# Patient Record
Sex: Female | Born: 1955 | Race: Black or African American | Hispanic: No | Marital: Single | Smoking: Former smoker
Health system: Southern US, Community
[De-identification: ages and names within clinical notes are randomized; demographics above are authoritative.]

## PROBLEM LIST (undated history)

## (undated) DIAGNOSIS — M797 Fibromyalgia: Secondary | ICD-10-CM

## (undated) DIAGNOSIS — I10 Essential (primary) hypertension: Secondary | ICD-10-CM

## (undated) DIAGNOSIS — M51369 Other intervertebral disc degeneration, lumbar region without mention of lumbar back pain or lower extremity pain: Secondary | ICD-10-CM

## (undated) DIAGNOSIS — K2270 Barrett's esophagus without dysplasia: Secondary | ICD-10-CM

## (undated) DIAGNOSIS — M5136 Other intervertebral disc degeneration, lumbar region: Secondary | ICD-10-CM

## (undated) DIAGNOSIS — G43909 Migraine, unspecified, not intractable, without status migrainosus: Secondary | ICD-10-CM

## (undated) DIAGNOSIS — K259 Gastric ulcer, unspecified as acute or chronic, without hemorrhage or perforation: Secondary | ICD-10-CM

## (undated) HISTORY — PX: COLONOSCOPY: SHX174

## (undated) HISTORY — PX: UPPER GI ENDOSCOPY: SHX6162

## (undated) HISTORY — PX: ABDOMINAL HYSTERECTOMY: SHX81

---

## 2010-03-12 ENCOUNTER — Emergency Department (HOSPITAL_BASED_OUTPATIENT_CLINIC_OR_DEPARTMENT_OTHER): Admission: EM | Admit: 2010-03-12 | Discharge: 2010-03-12 | Payer: Self-pay | Admitting: Emergency Medicine

## 2010-03-14 ENCOUNTER — Emergency Department (HOSPITAL_BASED_OUTPATIENT_CLINIC_OR_DEPARTMENT_OTHER): Admission: EM | Admit: 2010-03-14 | Discharge: 2010-03-14 | Payer: Self-pay | Admitting: Emergency Medicine

## 2010-03-14 ENCOUNTER — Ambulatory Visit: Payer: Self-pay | Admitting: Diagnostic Radiology

## 2010-07-22 ENCOUNTER — Emergency Department (HOSPITAL_BASED_OUTPATIENT_CLINIC_OR_DEPARTMENT_OTHER)
Admission: EM | Admit: 2010-07-22 | Discharge: 2010-07-22 | Disposition: A | Discharge status: Home / Self Care | Payer: Medicare Other | Attending: Emergency Medicine | Admitting: Emergency Medicine

## 2010-07-22 DIAGNOSIS — I10 Essential (primary) hypertension: Secondary | ICD-10-CM | POA: Insufficient documentation

## 2010-07-22 DIAGNOSIS — Z79899 Other long term (current) drug therapy: Secondary | ICD-10-CM | POA: Insufficient documentation

## 2010-07-22 DIAGNOSIS — IMO0001 Reserved for inherently not codable concepts without codable children: Secondary | ICD-10-CM | POA: Insufficient documentation

## 2010-07-22 DIAGNOSIS — K219 Gastro-esophageal reflux disease without esophagitis: Secondary | ICD-10-CM | POA: Insufficient documentation

## 2010-07-22 DIAGNOSIS — G43909 Migraine, unspecified, not intractable, without status migrainosus: Secondary | ICD-10-CM | POA: Insufficient documentation

## 2010-08-30 LAB — CBC
HCT: 39.2 % (ref 36.0–46.0)
Hemoglobin: 12.9 g/dL (ref 12.0–15.0)
MCH: 29.6 pg (ref 26.0–34.0)
MCHC: 32.9 g/dL (ref 30.0–36.0)
MCV: 90.2 fL (ref 78.0–100.0)
RBC: 4.35 MIL/uL (ref 3.87–5.11)

## 2010-08-30 LAB — DIFFERENTIAL
Basophils Relative: 1 % (ref 0–1)
Eosinophils Absolute: 0.1 10*3/uL (ref 0.0–0.7)
Lymphs Abs: 4.2 10*3/uL — ABNORMAL HIGH (ref 0.7–4.0)
Monocytes Absolute: 1.3 10*3/uL — ABNORMAL HIGH (ref 0.1–1.0)
Monocytes Relative: 10 % (ref 3–12)
Neutrophils Relative %: 56 % (ref 43–77)

## 2010-08-30 LAB — POCT TOXICOLOGY PANEL: Opiates: POSITIVE

## 2010-08-30 LAB — BASIC METABOLIC PANEL
CO2: 23 mEq/L (ref 19–32)
Chloride: 104 mEq/L (ref 96–112)
GFR calc Af Amer: >60 mL/min (ref >60)
Glucose, Bld: 96 mg/dL (ref 70–99)
Potassium: 3.4 mEq/L — ABNORMAL LOW (ref 3.5–5.1)
Sodium: 141 mEq/L (ref 135–145)

## 2014-01-18 ENCOUNTER — Emergency Department (HOSPITAL_COMMUNITY)
Admission: EM | Admit: 2014-01-18 | Discharge: 2014-01-18 | Disposition: A | Discharge status: Home / Self Care | Payer: Medicare Other | Attending: Emergency Medicine | Admitting: Emergency Medicine

## 2014-01-18 ENCOUNTER — Encounter (HOSPITAL_COMMUNITY): Payer: Self-pay | Admitting: Emergency Medicine

## 2014-01-18 DIAGNOSIS — Z8739 Personal history of other diseases of the musculoskeletal system and connective tissue: Secondary | ICD-10-CM | POA: Diagnosis not present

## 2014-01-18 DIAGNOSIS — Y939 Activity, unspecified: Secondary | ICD-10-CM | POA: Diagnosis not present

## 2014-01-18 DIAGNOSIS — G43909 Migraine, unspecified, not intractable, without status migrainosus: Secondary | ICD-10-CM | POA: Diagnosis not present

## 2014-01-18 DIAGNOSIS — Z880 Allergy status to penicillin: Secondary | ICD-10-CM | POA: Diagnosis not present

## 2014-01-18 DIAGNOSIS — IMO0001 Reserved for inherently not codable concepts without codable children: Secondary | ICD-10-CM | POA: Diagnosis not present

## 2014-01-18 DIAGNOSIS — R52 Pain, unspecified: Secondary | ICD-10-CM | POA: Diagnosis not present

## 2014-01-18 DIAGNOSIS — F17200 Nicotine dependence, unspecified, uncomplicated: Secondary | ICD-10-CM | POA: Insufficient documentation

## 2014-01-18 DIAGNOSIS — S90569A Insect bite (nonvenomous), unspecified ankle, initial encounter: Secondary | ICD-10-CM | POA: Insufficient documentation

## 2014-01-18 DIAGNOSIS — R110 Nausea: Secondary | ICD-10-CM | POA: Diagnosis not present

## 2014-01-18 DIAGNOSIS — Y9229 Other specified public building as the place of occurrence of the external cause: Secondary | ICD-10-CM | POA: Diagnosis not present

## 2014-01-18 DIAGNOSIS — W57XXXA Bitten or stung by nonvenomous insect and other nonvenomous arthropods, initial encounter: Secondary | ICD-10-CM | POA: Diagnosis not present

## 2014-01-18 DIAGNOSIS — R51 Headache: Secondary | ICD-10-CM

## 2014-01-18 DIAGNOSIS — R519 Headache, unspecified: Secondary | ICD-10-CM

## 2014-01-18 DIAGNOSIS — S30860A Insect bite (nonvenomous) of lower back and pelvis, initial encounter: Secondary | ICD-10-CM | POA: Diagnosis not present

## 2014-01-18 HISTORY — DX: Fibromyalgia: M79.7

## 2014-01-18 MED ORDER — DIPHENHYDRAMINE HCL 50 MG/ML IJ SOLN
25.0000 mg | Freq: Once | INTRAMUSCULAR | Status: AC
  Administered 2014-01-18: 25 mg via INTRAMUSCULAR
  Filled 2014-01-18: qty 1

## 2014-01-18 MED ORDER — HYDROXYZINE HCL 25 MG PO TABS: 25.0000 mg | ORAL_TABLET | Freq: Four times a day (QID) | ORAL | Status: DC

## 2014-01-18 MED ORDER — METOCLOPRAMIDE HCL 5 MG/ML IJ SOLN
10.0000 mg | Freq: Once | INTRAMUSCULAR | Status: AC
  Administered 2014-01-18: 10 mg via INTRAMUSCULAR
  Filled 2014-01-18: qty 2

## 2014-01-18 NOTE — ED Notes (Signed)
Pt presents to department for evaluation of insect bites all over body. States she was staying at hotel over the weekend that has bed bugs. Now states itching and discomfort all over body. Pt is alert and oriented x4. NAD.

## 2014-01-18 NOTE — Discharge Instructions (Signed)
Please read and follow all provided instructions.  Your diagnoses today include:  1. Bed bug bite   2. Acute nonintractable headache, unspecified headache type     Tests performed today include:  Vital signs. See below for your results today.   Medications:  In the Emergency Department you received:  Reglan - antinausea/headache medication  Benadryl - antihistamine to counteract potential side effects of reglan    Hydroxyzine - antihistamine  You can find this medication over-the-counter.   This medication will make you drowsy. DO NOT drive or perform any activities that require you to be awake and alert if taking this.  Take any prescribed medications only as directed.  Additional information:  Follow any educational materials contained in this packet.  You are having a headache. No specific cause was found today for your headache. It may have been a migraine or other cause of headache. Stress, anxiety, fatigue, and depression are common triggers for headaches.   Your headache today does not appear to be life-threatening or require hospitalization, but often the exact cause of headaches is not determined in the emergency department. Therefore, follow-up with your doctor is very important to find out what may have caused your headache and whether or not you need any further diagnostic testing or treatment.   Sometimes headaches can appear benign (not harmful), but then more serious symptoms can develop which should prompt an immediate re-evaluation by your doctor or the emergency department.  BE VERY CAREFUL not to take multiple medicines containing Tylenol (also called acetaminophen). Doing so can lead to an overdose which can damage your liver and cause liver failure and possibly death.   Follow-up instructions: Please follow-up with your primary care provider in the next 3 days for further evaluation of your symptoms.   Return instructions:   Please return to the  Emergency Department if you experience worsening symptoms.  Return if the medications do not resolve your headache, if it recurs, or if you have multiple episodes of vomiting or cannot keep down fluids.  Return if you have a change from the usual headache.  RETURN IMMEDIATELY IF you:  Develop a sudden, severe headache  Develop confusion or become poorly responsive or faint  Develop a fever above 100.8F or problem breathing  Have a change in speech, vision, swallowing, or understanding  Develop new weakness, numbness, tingling, incoordination in your arms or legs  Have a seizure  Please return if you have any other emergent concerns.  Additional Information:  Your vital signs today were: BP 135/94   Pulse 108   Temp(Src) 98.3 F (36.8 C) (Oral)   Resp 18   SpO2 97% If your blood pressure (BP) was elevated above 135/85 this visit, please have this repeated by your doctor within one month. --------------

## 2014-01-18 NOTE — ED Provider Notes (Signed)
CSN: [id number]     Arrival date & time 01/18/14  1149 History  This chart was scribed for Rhea BleacherJosh Gearlene Godsil, PA-C, working with Suzi RootsKevin E Steinl, MD by Leona CarryG. Clay Sherrill, ED Scribe. The patient was seen in TR05C/TR05C. The patient's care was started at 12:07 PM.   Chief Complaint  Patient presents with  . Insect Bite    The history is provided by the patient. No language interpreter was used.   HPI Comments: Pamela Graham is a 58 y.o. female who presents to the Emergency Department complaining of multiple insect bites scattered across her body that occurred three days ago while she was staying at a [employer]. Patient reports associated itching and nausea. She has used benadryl cream without relief. She also complains of a migraine headache. Pain is same as previous headaches. She denies fever or vomiting. Patient denies signs of stroke including: facial droop, slurred speech, aphasia, weakness/numbness in extremities, imbalance/trouble walking.  Patient does not have a PCP.   Past Medical History  Diagnosis Date  . Fibromyalgia    History reviewed. No pertinent past surgical history. No family history on file. History  Substance Use Topics  . Smoking status: Current Every Day Smoker    Types: Cigarettes  . Smokeless tobacco: Not on file  . Alcohol Use: Yes   OB History   Grav Para Term Preterm Abortions TAB SAB Ect Mult Living                 Review of Systems  Constitutional: Negative for fever.  HENT: Negative for congestion, dental problem, rhinorrhea and sinus pressure.   Eyes: Negative for photophobia, discharge, redness and visual disturbance.  Respiratory: Negative for shortness of breath.   Cardiovascular: Negative for chest pain.  Gastrointestinal: Positive for nausea. Negative for vomiting.  Musculoskeletal: Negative for gait problem, neck pain and neck stiffness.  Skin: Positive for rash (insect bites).  Neurological: Positive for headaches. Negative for syncope, speech  difficulty, weakness, light-headedness and numbness.  Psychiatric/Behavioral: Negative for confusion.      Allergies  Motrin; Penicillins; and Tramadol  Home Medications   Prior to Admission medications   Not on File   Triage Vitals: BP 135/94  Pulse 108  Temp(Src) 98.3 F (36.8 C) (Oral)  Resp 18  SpO2 97% Physical Exam  Nursing note and vitals reviewed. Constitutional: She is oriented to person, place, and time. She appears well-developed and well-nourished. No distress.  HENT:  Head: Normocephalic and atraumatic.  No oropharyngeal involvement.  Eyes: Conjunctivae and EOM are normal.  Neck: Normal range of motion. Neck supple. No tracheal deviation present.  Cardiovascular: Normal rate.   Pulmonary/Chest: Effort normal. No respiratory distress.  Musculoskeletal: Normal range of motion.  Neurological: She is alert and oriented to person, place, and time.  Skin: Skin is warm and dry.  Patient with scattered papules with excoriations of her extremities and back consistent with insect bite. No lesions on palms or soles.  Psychiatric: She has a normal mood and affect. Her behavior is normal.    ED Course  Procedures (including critical care time) DIAGNOSTIC STUDIES: Oxygen Saturation is 97% on room air, normal by my interpretation.    COORDINATION OF CARE: 12:13 PM-Discussed treatment plan which includes Benadryl and Reglan with pt at bedside and pt agreed to plan.     Labs Review Labs Reviewed - No data to display  Imaging Review No results found.   EKG Interpretation None      Vital signs reviewed  and are as follows: Filed Vitals:   01/18/14 1205  BP: 135/94  Pulse: 108  Temp: 98.3 F (36.8 C)  Resp: 18   Patient prescribed Atarax for home. Patient urged to return with worsening symptoms or other concerns. Patient verbalized understanding and agrees with plan.     MDM   Final diagnoses:  Bed bug bite  Acute nonintractable headache, unspecified  headache type   Patient with rash consistent with a bug bite given recent history of staying in a motel. No signs of anaphylaxis. No new skin exposures or medications. Patient appears well, nontoxic. No systemic symptoms of illness.  Patient with migraine headache, same as previous migraine headaches. Patient without high-risk features of headache including: sudden onset/thunderclap HA, no similar headache in past, altered mental status, accompanying seizure, headache with exertion, age > 11, history of immunocompromised, neck or shoulder pain, fever, use of anticoagulation, family history of spontaneous SAH, concomitant drug use, toxic exposure.   Patient has a normal complete neurological exam, normal vital signs, normal level of consciousness, no signs of meningismus, is well-appearing/non-toxic appearing, no papilledema, no signs of trauma, no pain over the temporal arteries.   Imaging with CT/MRI not indicated given history and physical exam findings.   No dangerous or life-threatening conditions suspected or identified by history, physical exam, and by work-up. No indications for hospitalization identified.    I personally performed the services described in this documentation, which was scribed in my presence. The recorded information has been reviewed and is accurate.   Renne Crigler, PA-C 01/18/14 1230

## 2014-01-18 NOTE — ED Notes (Signed)
Pt instructed to remain in exam room to see physician.

## 2014-01-18 NOTE — ED Notes (Signed)
PT CHECKED IN AND STATED SHE WAS GOING OUT TO SMOKE, EXPLAINED TO PT THAT SHE CAN NOT CHECK IN AND LEAVE BUT SHE WALKED OUT THE DOOR

## 2014-01-19 NOTE — ED Provider Notes (Signed)
Medical screening examination/treatment/procedure(s) were performed by non-physician practitioner and as supervising physician I was immediately available for consultation/collaboration.     Aleksandr Pellow E Marialuiza Car, MD 01/19/14 1906 

## 2014-02-14 ENCOUNTER — Emergency Department (HOSPITAL_BASED_OUTPATIENT_CLINIC_OR_DEPARTMENT_OTHER): Payer: Medicare Other

## 2014-02-14 ENCOUNTER — Emergency Department (HOSPITAL_BASED_OUTPATIENT_CLINIC_OR_DEPARTMENT_OTHER)
Admission: EM | Admit: 2014-02-14 | Discharge: 2014-02-14 | Disposition: A | Discharge status: Home / Self Care | Payer: Medicare Other | Attending: Emergency Medicine | Admitting: Emergency Medicine

## 2014-02-14 ENCOUNTER — Encounter (HOSPITAL_BASED_OUTPATIENT_CLINIC_OR_DEPARTMENT_OTHER): Payer: Self-pay | Admitting: Emergency Medicine

## 2014-02-14 DIAGNOSIS — S3992XA Unspecified injury of lower back, initial encounter: Secondary | ICD-10-CM

## 2014-02-14 DIAGNOSIS — Y9289 Other specified places as the place of occurrence of the external cause: Secondary | ICD-10-CM | POA: Diagnosis not present

## 2014-02-14 DIAGNOSIS — I10 Essential (primary) hypertension: Secondary | ICD-10-CM | POA: Diagnosis not present

## 2014-02-14 DIAGNOSIS — IMO0002 Reserved for concepts with insufficient information to code with codable children: Secondary | ICD-10-CM | POA: Insufficient documentation

## 2014-02-14 DIAGNOSIS — Z880 Allergy status to penicillin: Secondary | ICD-10-CM | POA: Diagnosis not present

## 2014-02-14 DIAGNOSIS — F17200 Nicotine dependence, unspecified, uncomplicated: Secondary | ICD-10-CM | POA: Diagnosis not present

## 2014-02-14 DIAGNOSIS — W010XXA Fall on same level from slipping, tripping and stumbling without subsequent striking against object, initial encounter: Secondary | ICD-10-CM | POA: Diagnosis not present

## 2014-02-14 DIAGNOSIS — Y9389 Activity, other specified: Secondary | ICD-10-CM | POA: Diagnosis not present

## 2014-02-14 DIAGNOSIS — Z8719 Personal history of other diseases of the digestive system: Secondary | ICD-10-CM | POA: Diagnosis not present

## 2014-02-14 HISTORY — DX: Migraine, unspecified, not intractable, without status migrainosus: G43.909

## 2014-02-14 HISTORY — DX: Gastric ulcer, unspecified as acute or chronic, without hemorrhage or perforation: K25.9

## 2014-02-14 HISTORY — DX: Barrett's esophagus without dysplasia: K22.70

## 2014-02-14 HISTORY — DX: Essential (primary) hypertension: I10

## 2014-02-14 MED ORDER — HYDROCODONE-ACETAMINOPHEN 5-325 MG PO TABS
1.0000 | ORAL_TABLET | Freq: Once | ORAL | Status: AC
  Administered 2014-02-14: 1 via ORAL
  Filled 2014-02-14: qty 1

## 2014-02-14 MED ORDER — HYDROCODONE-ACETAMINOPHEN 5-325 MG PO TABS: 1.0000 | ORAL_TABLET | Freq: Four times a day (QID) | ORAL | Status: DC | PRN

## 2014-02-14 NOTE — ED Notes (Signed)
Fall from toilet c/o tailbone pain since 1300 sunday

## 2014-02-14 NOTE — Discharge Instructions (Signed)
Tailbone Injury  The tailbone (coccyx) is the small bone at the lower end of the spine. A tailbone injury may involve stretched ligaments, bruising, or a broken bone (fracture). Women are more vulnerable to this injury due to having a wider pelvis.  CAUSES   This type of injury typically occurs from falling and landing on the tailbone. Repeated strain or friction from actions such as rowing and bicycling may also injure the area. The tailbone can be injured during childbirth. Infections or tumors may also press on the tailbone and cause pain. Sometimes, the cause of injury is unknown.  SYMPTOMS    Bruising.   Pain when sitting.   Painful bowel movements.   In women, pain during intercourse.  DIAGNOSIS   Your caregiver can diagnose a tailbone injury based on your symptoms and a physical exam. X-rays may be taken if a fracture is suspected. Your caregiver may also use an MRI scan imaging test to evaluate your symptoms.  TREATMENT   Your caregiver may prescribe medicines to help relieve your pain. Most tailbone injuries heal on their own in 4 to 6 weeks. However, if the injury is caused by an infection or tumor, the recovery period may vary.  PREVENTION   Wear appropriate padding and sports gear when bicycling and rowing. This can help prevent an injury from repeated strain or friction.  HOME CARE INSTRUCTIONS    Put ice on the injured area.   Put ice in a plastic bag.   Place a towel between your skin and the bag.   Leave the ice on for 15-20 minutes, every hour while awake for the first 1 to 2 days.   Sit on a large, rubber or inflated ring or cushion to ease your pain. Lean forward when sitting to help decrease discomfort.   Avoid sitting for long periods of time.   Increase your activity as the pain allows.   Only take over-the-counter or prescription medicines for pain, discomfort, or fever as directed by your caregiver.   You may use stool softeners if it is painful to have a bowel movement, or as  directed by your caregiver.   Eat a diet with plenty of fiber to help prevent constipation.   Keep all follow-up appointments as directed by your caregiver.  SEEK MEDICAL CARE IF:    Your pain becomes worse.   Your bowel movements cause a great deal of discomfort.   You are unable to have a bowel movement.   You have a fever.  MAKE SURE YOU:   Understand these instructions.   Will watch your condition.   Will get help right away if you are not doing well or get worse.  Document Released: 05/31/2000 Document Revised: 08/26/2011 Document Reviewed: 12/27/2010  [employer] Patient Information 2015 [employer]. This information is not intended to replace advice given to you by your health care provider. Make sure you discuss any questions you have with your health care provider.

## 2014-02-14 NOTE — ED Provider Notes (Signed)
CSN: [id number]     Arrival date & time 02/14/14  0204 History   None    Chief Complaint  Patient presents with  . Tailbone Injury      (Consider location/radiation/quality/duration/timing/severity/associated sxs/prior Treatment) HPI This is a 58 year old female who is staying in a [facility]. Yesterday afternoon about 3:30 PM she went to sit on the toilet. The toilet seat was loose and she slipped off, landing on her coccyx. She called an ambulance this morning because of persistent pain in her coccyx making it difficult to move. She describes the pain as severe and worse with palpation or movement. She is also having some sharp pains down the back of both thighs. She is able to ambulate.  Past Medical History  Diagnosis Date  . Fibromyalgia   . Hypertension   . Barrett's esophagus   . Gastric ulcer   . Migraines    Past Surgical History  Procedure Laterality Date  . Colonoscopy    . Abdominal hysterectomy     History reviewed. No pertinent family history. History  Substance Use Topics  . Smoking status: Current Every Day Smoker    Types: Cigarettes  . Smokeless tobacco: Never Used  . Alcohol Use: Yes   OB History   Grav Para Term Preterm Abortions TAB SAB Ect Mult Living                 Review of Systems  All other systems reviewed and are negative.   Allergies  Motrin; Penicillins; and Tramadol  Home Medications   Prior to Admission medications   Medication Sig Start Date End Date Taking? Authorizing Provider  hydrOXYzine (ATARAX/VISTARIL) 25 MG tablet Take 1-2 tablets (25-50 mg total) by mouth every 6 (six) hours. 01/18/14   Renne Crigler, PA-C   BP 116/80  Temp(Src) 98.6 F (37 C) (Oral)  Resp 20  Ht 5' 7.75" (1.721 m)  Wt 180 lb (81.647 kg)  BMI 27.57 kg/m2  SpO2 100%  Physical Exam General: Well-developed, well-nourished female in no acute distress; appearance consistent with age of record HENT: normocephalic; atraumatic Eyes: pupils  equal, round and reactive to light; extraocular muscles intact Neck: supple Heart: regular rate and rhythm Lungs: clear to auscultation bilaterally Abdomen: soft; nondistended; nontender; bowel sounds present Back: Sacral and coccygeal tenderness without step-off or crepitus Extremities: No deformity; full range of motion; pulses normal Neurologic: Awake, alert and oriented; motor function intact in all extremities and symmetric; no facial droop Skin: Warm and dry Psychiatric: Normal mood and affect   ED Course  Procedures (including critical care time)   MDM  Nursing notes and vitals signs, including pulse oximetry, reviewed.  Summary of this visit's results, reviewed by myself:  Labs:  No results found for this or any previous visit (from the past 24 hour(s)).  Imaging Studies: Dg Sacrum/coccyx  02/14/2014   CLINICAL DATA:  Tailbone pain status post fall.  EXAM: SACRUM AND COCCYX - 2+ VIEW  COMPARISON:  01/11/2014 lumbar spine radiograph, 05/21/2012 CT  FINDINGS: Sacrum is obscured by overlying bowel on the AP projections. On the lateral view, no displaced fracture identified.  IMPRESSION: Limited as above. No displaced fracture identified. Recommend low threshold for cross-sectional imaging if clinical concern persists.   Electronically Signed   By: Jearld Lesch M.D.   On: 02/14/2014 03:08      Hanley Seamen, MD 02/14/14 [phone]

## 2017-08-07 ENCOUNTER — Encounter (HOSPITAL_COMMUNITY): Payer: Self-pay | Admitting: Student

## 2017-08-07 ENCOUNTER — Emergency Department (HOSPITAL_COMMUNITY)
Admission: EM | Admit: 2017-08-07 | Discharge: 2017-08-08 | Disposition: A | Discharge status: Other Acute Inpatient Hospital | Payer: Medicare Other | Attending: Emergency Medicine | Admitting: Emergency Medicine

## 2017-08-07 ENCOUNTER — Other Ambulatory Visit: Payer: Self-pay

## 2017-08-07 DIAGNOSIS — F332 Major depressive disorder, recurrent severe without psychotic features: Secondary | ICD-10-CM | POA: Diagnosis present

## 2017-08-07 DIAGNOSIS — I10 Essential (primary) hypertension: Secondary | ICD-10-CM | POA: Insufficient documentation

## 2017-08-07 DIAGNOSIS — F329 Major depressive disorder, single episode, unspecified: Secondary | ICD-10-CM | POA: Diagnosis not present

## 2017-08-07 DIAGNOSIS — M797 Fibromyalgia: Secondary | ICD-10-CM | POA: Insufficient documentation

## 2017-08-07 DIAGNOSIS — Z01818 Encounter for other preprocedural examination: Secondary | ICD-10-CM

## 2017-08-07 DIAGNOSIS — F17210 Nicotine dependence, cigarettes, uncomplicated: Secondary | ICD-10-CM | POA: Insufficient documentation

## 2017-08-07 LAB — CBC WITH DIFFERENTIAL/PLATELET
BASOS ABS: 0 10*3/uL (ref 0.0–0.1)
BASOS PCT: 0 %
EOS ABS: 0.1 10*3/uL (ref 0.0–0.7)
Eosinophils Relative: 1 %
HEMATOCRIT: 40.5 % (ref 36.0–46.0)
HEMOGLOBIN: 13.7 g/dL (ref 12.0–15.0)
Lymphocytes Relative: 33 %
Lymphs Abs: 2 10*3/uL (ref 0.7–4.0)
MCH: 31.6 pg (ref 26.0–34.0)
MCHC: 33.8 g/dL (ref 30.0–36.0)
MCV: 93.5 fL (ref 78.0–100.0)
Monocytes Absolute: 0.5 10*3/uL (ref 0.1–1.0)
Monocytes Relative: 7 %
NEUTROS ABS: 3.5 10*3/uL (ref 1.7–7.7)
NEUTROS PCT: 59 %
Platelets: 157 10*3/uL (ref 150–400)
RBC: 4.33 MIL/uL (ref 3.87–5.11)
RDW: 12.9 % (ref 11.5–15.5)
WBC: 6.1 10*3/uL (ref 4.0–10.5)

## 2017-08-07 LAB — COMPREHENSIVE METABOLIC PANEL
ALT: 11 U/L — ABNORMAL LOW (ref 14–54)
ANION GAP: 11 (ref 5–15)
AST: 19 U/L (ref 15–41)
Albumin: 4.4 g/dL (ref 3.5–5.0)
Alkaline Phosphatase: 62 U/L (ref 38–126)
BUN: 10 mg/dL (ref 6–20)
CHLORIDE: 106 mmol/L (ref 101–111)
CO2: 24 mmol/L (ref 22–32)
Calcium: 9.5 mg/dL (ref 8.9–10.3)
Creatinine, Ser: 0.92 mg/dL (ref 0.44–1.00)
Glucose, Bld: 106 mg/dL — ABNORMAL HIGH (ref 65–99)
POTASSIUM: 3.4 mmol/L — AB (ref 3.5–5.1)
Sodium: 141 mmol/L (ref 135–145)
TOTAL PROTEIN: 7.7 g/dL (ref 6.5–8.1)
Total Bilirubin: 0.9 mg/dL (ref 0.3–1.2)

## 2017-08-07 LAB — RAPID URINE DRUG SCREEN, HOSP PERFORMED
Amphetamines: NOT DETECTED
BARBITURATES: NOT DETECTED
BENZODIAZEPINES: NOT DETECTED
COCAINE: NOT DETECTED
OPIATES: NOT DETECTED
TETRAHYDROCANNABINOL: NOT DETECTED

## 2017-08-07 LAB — SALICYLATE LEVEL

## 2017-08-07 LAB — ETHANOL: Alcohol, Ethyl (B): <10 mg/dL (ref <10)

## 2017-08-07 LAB — ACETAMINOPHEN LEVEL

## 2017-08-07 MED ORDER — ALUM & MAG HYDROXIDE-SIMETH 200-200-20 MG/5ML PO SUSP: 30.0000 mL | Freq: Four times a day (QID) | ORAL | Status: DC | PRN

## 2017-08-07 MED ORDER — POTASSIUM CHLORIDE CRYS ER 20 MEQ PO TBCR
40.0000 meq | EXTENDED_RELEASE_TABLET | Freq: Once | ORAL | Status: AC
  Administered 2017-08-07: 40 meq via ORAL
  Filled 2017-08-07: qty 2

## 2017-08-07 MED ORDER — NICOTINE 21 MG/24HR TD PT24: 21.0000 mg | MEDICATED_PATCH | Freq: Every day | TRANSDERMAL | Status: DC

## 2017-08-07 MED ORDER — ACETAMINOPHEN 325 MG PO TABS: 650.0000 mg | ORAL_TABLET | ORAL | Status: DC | PRN

## 2017-08-07 MED ORDER — ZOLPIDEM TARTRATE 5 MG PO TABS
5.0000 mg | ORAL_TABLET | Freq: Every evening | ORAL | Status: DC | PRN
  Administered 2017-08-07: 5 mg via ORAL
  Filled 2017-08-07: qty 1

## 2017-08-07 MED ORDER — ONDANSETRON HCL 4 MG PO TABS: 4.0000 mg | ORAL_TABLET | Freq: Three times a day (TID) | ORAL | Status: DC | PRN
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
## 2017-08-07 NOTE — Progress Notes (Signed)
TTS went to assess pt. Terri, RN is currently administering medication and states the pt is being moved to [facility]. TTS to assess pt once she has been roomed.   Princess BruinsAquicha Cedric Mcclaine, MSW, LCSW Therapeutic Triage Specialist  [phone]
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
## 2017-08-07 NOTE — ED Notes (Signed)
Nikki RN ready for patient/patient wanded by security prior to transfer to acute side.

## 2017-08-07 NOTE — ED Notes (Signed)
[id number] Expected date:  Expected time:  Means of arrival:  Comments: Hold: [facility]

## 2017-08-07 NOTE — ED Notes (Signed)
Patient given sandwich,graham crackers and gingerale with potassium
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
## 2017-08-07 NOTE — ED Notes (Signed)
Bed: [facility] Expected date:  Expected time:  Means of arrival:  Comments: 

## 2017-08-08 ENCOUNTER — Emergency Department (HOSPITAL_COMMUNITY): Payer: Medicare Other

## 2017-08-08 ENCOUNTER — Inpatient Hospital Stay (HOSPITAL_COMMUNITY)
Admission: AD | Admit: 2017-08-08 | Discharge: 2017-08-22 | DRG: 885 | Disposition: A | Discharge status: Home / Self Care | Payer: Medicare Other | Source: Intra-hospital | Attending: Psychiatry | Admitting: Psychiatry

## 2017-08-08 ENCOUNTER — Encounter (HOSPITAL_COMMUNITY): Payer: Self-pay

## 2017-08-08 ENCOUNTER — Other Ambulatory Visit: Payer: Self-pay

## 2017-08-08 DIAGNOSIS — F329 Major depressive disorder, single episode, unspecified: Secondary | ICD-10-CM | POA: Diagnosis present

## 2017-08-08 DIAGNOSIS — F5105 Insomnia due to other mental disorder: Secondary | ICD-10-CM | POA: Diagnosis present

## 2017-08-08 DIAGNOSIS — G4700 Insomnia, unspecified: Secondary | ICD-10-CM | POA: Diagnosis not present

## 2017-08-08 DIAGNOSIS — Z23 Encounter for immunization: Secondary | ICD-10-CM | POA: Diagnosis present

## 2017-08-08 DIAGNOSIS — I10 Essential (primary) hypertension: Secondary | ICD-10-CM | POA: Diagnosis present

## 2017-08-08 DIAGNOSIS — Z915 Personal history of self-harm: Secondary | ICD-10-CM | POA: Diagnosis not present

## 2017-08-08 DIAGNOSIS — F419 Anxiety disorder, unspecified: Secondary | ICD-10-CM | POA: Diagnosis present

## 2017-08-08 DIAGNOSIS — F332 Major depressive disorder, recurrent severe without psychotic features: Secondary | ICD-10-CM | POA: Diagnosis present

## 2017-08-08 DIAGNOSIS — Z90710 Acquired absence of both cervix and uterus: Secondary | ICD-10-CM

## 2017-08-08 DIAGNOSIS — F3132 Bipolar disorder, current episode depressed, moderate: Secondary | ICD-10-CM | POA: Diagnosis not present

## 2017-08-08 DIAGNOSIS — Z62810 Personal history of physical and sexual abuse in childhood: Secondary | ICD-10-CM | POA: Diagnosis not present

## 2017-08-08 DIAGNOSIS — M797 Fibromyalgia: Secondary | ICD-10-CM | POA: Diagnosis present

## 2017-08-08 DIAGNOSIS — Z8711 Personal history of peptic ulcer disease: Secondary | ICD-10-CM | POA: Diagnosis not present

## 2017-08-08 DIAGNOSIS — R45851 Suicidal ideations: Secondary | ICD-10-CM | POA: Diagnosis not present

## 2017-08-08 DIAGNOSIS — F39 Unspecified mood [affective] disorder: Secondary | ICD-10-CM | POA: Diagnosis not present

## 2017-08-08 DIAGNOSIS — R450 Nervousness: Secondary | ICD-10-CM | POA: Diagnosis not present

## 2017-08-08 DIAGNOSIS — E559 Vitamin D deficiency, unspecified: Secondary | ICD-10-CM | POA: Diagnosis not present

## 2017-08-08 DIAGNOSIS — Z560 Unemployment, unspecified: Secondary | ICD-10-CM | POA: Diagnosis not present

## 2017-08-08 DIAGNOSIS — Z818 Family history of other mental and behavioral disorders: Secondary | ICD-10-CM | POA: Diagnosis not present

## 2017-08-08 DIAGNOSIS — Z87891 Personal history of nicotine dependence: Secondary | ICD-10-CM | POA: Diagnosis not present

## 2017-08-08 LAB — URINALYSIS, ROUTINE W REFLEX MICROSCOPIC
Bilirubin Urine: NEGATIVE
GLUCOSE, UA: NEGATIVE mg/dL
KETONES UR: 5 mg/dL — AB
LEUKOCYTES UA: NEGATIVE
NITRITE: POSITIVE — AB
PH: 5 (ref 5.0–8.0)
Protein, ur: NEGATIVE mg/dL
SQUAMOUS EPITHELIAL / LPF: NONE SEEN
Specific Gravity, Urine: 1.027 (ref 1.005–1.030)
WBC, UA: NONE SEEN WBC/hpf (ref 0–5)

## 2017-08-08 MED ORDER — HYDROXYZINE HCL 25 MG PO TABS
25.0000 mg | ORAL_TABLET | Freq: Three times a day (TID) | ORAL | Status: DC | PRN
  Administered 2017-08-08 – 2017-08-20 (×15): 25 mg via ORAL
  Filled 2017-08-08: qty 1
  Filled 2017-08-08: qty 10
  Filled 2017-08-08 (×13): qty 1

## 2017-08-08 MED ORDER — QUETIAPINE FUMARATE 25 MG PO TABS: 25.0000 mg | ORAL_TABLET | Freq: Every day | ORAL | Status: DC

## 2017-08-08 MED ORDER — NICOTINE 21 MG/24HR TD PT24
21.0000 mg | MEDICATED_PATCH | Freq: Every day | TRANSDERMAL | Status: DC
  Filled 2017-08-08 (×3): qty 1

## 2017-08-08 MED ORDER — CITALOPRAM HYDROBROMIDE 10 MG PO TABS
10.0000 mg | ORAL_TABLET | Freq: Every day | ORAL | Status: DC
  Filled 2017-08-08 (×2): qty 1

## 2017-08-08 MED ORDER — ALUM & MAG HYDROXIDE-SIMETH 200-200-20 MG/5ML PO SUSP: 30.0000 mL | Freq: Four times a day (QID) | ORAL | Status: DC | PRN

## 2017-08-08 MED ORDER — INFLUENZA VAC SPLIT QUAD 0.5 ML IM SUSY
0.5000 mL | PREFILLED_SYRINGE | INTRAMUSCULAR | Status: AC
  Administered 2017-08-09: 0.5 mL via INTRAMUSCULAR
  Filled 2017-08-08: qty 0.5

## 2017-08-08 MED ORDER — ACETAMINOPHEN 325 MG PO TABS
650.0000 mg | ORAL_TABLET | ORAL | Status: DC | PRN
  Administered 2017-08-10 – 2017-08-21 (×14): 650 mg via ORAL
  Filled 2017-08-08 (×14): qty 2

## 2017-08-08 MED ORDER — MAGNESIUM HYDROXIDE 400 MG/5ML PO SUSP
30.0000 mL | Freq: Every day | ORAL | Status: DC | PRN
  Administered 2017-08-20: 30 mL via ORAL
  Filled 2017-08-08: qty 30

## 2017-08-08 MED ORDER — TRAZODONE HCL 50 MG PO TABS
50.0000 mg | ORAL_TABLET | Freq: Every evening | ORAL | Status: DC | PRN
  Administered 2017-08-08: 50 mg via ORAL
  Filled 2017-08-08 (×4): qty 1

## 2017-08-08 MED ORDER — CITALOPRAM HYDROBROMIDE 10 MG PO TABS
10.0000 mg | ORAL_TABLET | Freq: Every day | ORAL | Status: DC
  Administered 2017-08-08: 10 mg via ORAL
  Filled 2017-08-08: qty 1

## 2017-08-08 MED ORDER — ONDANSETRON HCL 4 MG PO TABS
4.0000 mg | ORAL_TABLET | Freq: Three times a day (TID) | ORAL | Status: DC | PRN
Start: 2017-08-08 — End: 2017-08-22

## 2017-08-08 MED ORDER — QUETIAPINE FUMARATE 25 MG PO TABS
25.0000 mg | ORAL_TABLET | Freq: Every day | ORAL | Status: DC
  Filled 2017-08-08 (×3): qty 1

## 2017-08-08 NOTE — ED Notes (Signed)
This nurse notified Jameson,NP of pt resulted UA.

## 2017-08-08 NOTE — ED Notes (Signed)
Pt presents with sad affect, reports feeling depressed. Denies SI/HI/AVH. Special checks q 15 mins in place for safety, Video monitoring in place. Will continue to monitor.

## 2017-08-08 NOTE — Progress Notes (Signed)
Adult Psychoeducational Group Note  Date:  08/08/2017 Time:  9:04 PM  Group Topic/Focus:  Wrap-Up Group:   The focus of this group is to help patients review their daily goal of treatment and discuss progress on daily workbooks.  Participation Level:  Active  Participation Quality:  Appropriate and Attentive  Affect:  Appropriate  Cognitive:  Alert, Appropriate and Oriented  Insight: Good  Engagement in Group:  Engaged  Modes of Intervention:  Discussion  Additional Comments:  Pt only got here 4 hrs ago, so she did not have a goal for the day. Pt rated her day a 5/10 and stated she is trying to get used to everything and be more understanding with her depression. Pt stated she will work on those same things tomorrow.  Leo GrosserMegan A Tarrence Enck 08/08/2017, 9:04 PM

## 2017-08-08 NOTE — ED Notes (Signed)
Pt taking a shower 
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
## 2017-08-08 NOTE — Progress Notes (Signed)
Patient ID: Pamela Graham, female   DOB: 07/08/1955, 62 y.o.   MRN: [id number]  PER STATE REGULATIONS 482.30  THIS CHART WAS REVIEWED FOR MEDICAL NECESSITY WITH RESPECT TO THE PATIENT'S ADMISSION/DURATION OF STAY.  NEXT REVIEW DATE: 08/12/17  Loura HaltBARBARA Pamela Burkel, RN, BSN CASE MANAGER

## 2017-08-08 NOTE — Tx Team (Signed)
Initial Treatment Plan 08/08/2017 6:07 PM Pamela RuaKim Graham EAV:[id number]    PATIENT STRESSORS: Health problems Marital or family conflict   PATIENT STRENGTHS: Communication skills General fund of knowledge Religious Affiliation   PATIENT IDENTIFIED PROBLEMS: Depression    "Be the best that I can be mentally and physically"                 DISCHARGE CRITERIA:  Improved stabilization in mood, thinking, and/or behavior Verbal commitment to aftercare and medication compliance  PRELIMINARY DISCHARGE PLAN: Outpatient therapy Medication management  PATIENT/FAMILY INVOLVEMENT: This treatment plan has been presented to and reviewed with the patient, Pamela Graham.  The patient and family have been given the opportunity to ask questions and make suggestions.  Levin BaconHeather V Meliya Mcconahy, RN 08/08/2017, 6:07 PM

## 2017-08-08 NOTE — Progress Notes (Signed)
D: Pt very evasive about SI or not. Pt stated she had a lot of emotional pain. Pt focused on what her daughter said to get her in here. Pt encouraged to focus on moving forward. Pt refused her Seroquel this evening stating it makes her heart race when she takes it. Pt was given 50 Trazodone, 25 Vistaril per MAR to help her sleep and help with her anxiety. Pt stated she was depressed and that causes her to be indecisive and feel helpless. Pt stated she saw Dr. Merryl Hackerakoriya until she moved and has not seen anyone since. Pt stated she stopped taking her medications. Pt said she was on Cymbalta that helped her in the past.   A: Pt was offered support and encouragement. Pt was given scheduled medications. Pt was encourage to attend groups. Q 15 minute checks were done for safety.   R:Pt attends groups and interacts well with peers and staff. Pt is taking medication. Pt receptive to treatment and safety maintained on unit.

## 2017-08-08 NOTE — BH Assessment (Signed)
[facility]  Per Juanetta BeetsJacqueline Norman, DO, this pt requires psychiatric hospitalization.  Berneice Heinrichina Tate, RN, [facility]  Pt presents under IVC initiated by pt's daughter, and upheld by Dr Sharma CovertNorman, and [facility].  Pt's nurse, Morrie Sheldonshley, has been notified, and agrees to call report to [phone].  Pt is to be transported via [employer].   Doylene Canninghomas Dmarion Perfect, [place] Behavioral Health Coordinator [phone]

## 2017-08-08 NOTE — ED Notes (Signed)
[facility] order. Pt signed for personal property and property given to [employer] for transfer. Pt ambulatory off unit in police custody.
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
## 2017-08-08 NOTE — ED Notes (Signed)
Spoke with lab, will add ordered UA to urine specimen in lab.
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
## 2017-08-09 DIAGNOSIS — F3132 Bipolar disorder, current episode depressed, moderate: Secondary | ICD-10-CM

## 2017-08-09 DIAGNOSIS — Z818 Family history of other mental and behavioral disorders: Secondary | ICD-10-CM

## 2017-08-09 DIAGNOSIS — Z560 Unemployment, unspecified: Secondary | ICD-10-CM

## 2017-08-09 DIAGNOSIS — Z62810 Personal history of physical and sexual abuse in childhood: Secondary | ICD-10-CM

## 2017-08-09 MED ORDER — TRAZODONE HCL 50 MG PO TABS
50.0000 mg | ORAL_TABLET | Freq: Every evening | ORAL | Status: DC | PRN
  Administered 2017-08-09 – 2017-08-10 (×2): 50 mg via ORAL
  Filled 2017-08-09 (×2): qty 1

## 2017-08-09 MED ORDER — DULOXETINE HCL 20 MG PO CPEP
40.0000 mg | ORAL_CAPSULE | Freq: Every day | ORAL | Status: DC
  Administered 2017-08-09 – 2017-08-10 (×2): 40 mg via ORAL
  Filled 2017-08-09 (×5): qty 2

## 2017-08-09 MED ORDER — DULOXETINE HCL 20 MG PO CPEP
40.0000 mg | ORAL_CAPSULE | Freq: Every day | ORAL | Status: DC
  Filled 2017-08-09: qty 2

## 2017-08-09 MED ORDER — ARIPIPRAZOLE 5 MG PO TABS
5.0000 mg | ORAL_TABLET | Freq: Every day | ORAL | Status: DC
  Filled 2017-08-09: qty 1

## 2017-08-09 MED ORDER — ARIPIPRAZOLE 5 MG PO TABS
5.0000 mg | ORAL_TABLET | Freq: Every day | ORAL | Status: DC
  Administered 2017-08-09 – 2017-08-10 (×2): 5 mg via ORAL
  Filled 2017-08-09 (×5): qty 1

## 2017-08-09 NOTE — Progress Notes (Signed)
D. Pt presents with a flat affect and depressed behavior, but brightens a little upon interaction. Pt observed attending group this am.  Per pt's self inventory, pt rates his depression, hopelessness and anxiety a 5/5/5, respectively. Pt writes that her goal today is "to be more positive-to understand depression more".  Pt currently denies SI/HI and AVH and agrees to contact staff before acting on any harmful thoughts.  A. Labs and vitals monitored. Pt compliant with medications. Pt supported emotionally and encouraged to express concerns and ask questions.   R. Pt remains safe with 15 minute checks. Will continue POC.

## 2017-08-09 NOTE — [facility] Suicide Risk Assessment (Signed)
[facility]   Nursing information obtained from:  Patient Demographic factors:  Unemployed Current Mental Status:  NA Loss Factors:  Decline in physical health Historical Factors:  NA Risk Reduction Factors:  Living with another person, especially a relative  Total Time spent with patient: 45 minutes Principal Problem:  Bipolar Disorder, Depressed  Diagnosis:  Bipolar Disorder, Depressed    Continued Clinical Symptoms:  Alcohol Use Disorder Identification Test Final Score (AUDIT): 0 The "Alcohol Use Disorders Identification Test", Guidelines for Use in Primary Care, Second Edition.  World Science writerHealth Organization [facility](WHO). Score between 0-7:  no or low risk or alcohol related problems. Score between 8-15:  moderate risk of alcohol related problems. Score between 16-19:  high risk of alcohol related problems. Score 20 or above:  warrants further diagnostic evaluation for alcohol dependence and treatment.   CLINICAL FACTORS:  62 year old female, lives with adult daughter, admitted under commitment due to depression, lack of self care, poor appetite, threatening behaviors towards daughter. Of note, patient endorses depression, decreased level of functioning but categorically denies having made any threats or violent behaviors.   Psychiatric Specialty Exam: Physical Exam  ROS  Blood pressure 91/64, pulse 96, temperature 98.2 F (36.8 C), temperature source Oral, resp. rate 18, height 5\' 6"  (1.676 m), weight 65.5 kg (144 lb 8 oz), SpO2 97 %.Body mass index is 23.32 kg/m.  See admit note MSE                                                        COGNITIVE FEATURES THAT CONTRIBUTE TO RISK:  Closed-mindedness and Loss of executive function    SUICIDE RISK:   Moderate:  Frequent suicidal ideation with limited intensity, and duration, some specificity in terms of plans, no associated intent, good self-control, limited  dysphoria/symptomatology, some risk factors present, and identifiable protective factors, including available and accessible social support.  PLAN OF CARE: Patient will be admitted to inpatient psychiatric unit for stabilization and safety. Will provide and encourage milieu participation. Provide medication management and maked adjustments as needed.  Will follow daily.    I certify that inpatient services furnished can reasonably be expected to improve the patient's condition.   Craige CottaFernando A Darel Ricketts, MD 08/09/2017, 9:34 AM

## 2017-08-09 NOTE — [facility] Group Notes (Signed)
LCSW Group Therapy Note  08/09/2017 9:30-10:30AM - 300 Hall, 10:30-11:30 - 400 Hall, 11:30-12:00 - 500 Hall  Type of Therapy and Topic:  Group Therapy: Anger Cues and Responses  Participation Level:  Active   Description of Group:   In this group, patients learned how to recognize the physical, cognitive, emotional, and behavioral responses they have to anger-provoking situations.  They identified a recent time they became angry and how they reacted.  They analyzed how their reaction was possibly beneficial and how it was possibly unhelpful.  The group discussed a variety of healthier coping skills that could help with such a situation in the future.  Deep breathing was practiced briefly.  Therapeutic Goals: 1. Patients will remember their last incident of anger and how they felt emotionally and physically, what their thoughts were at the time, and how they behaved. 2. Patients will identify how their behavior at that time worked for them, as well as how it worked against them. 3. Patients will explore possible new behaviors to use in future anger situations. 4. Patients will learn that anger itself is normal and cannot be eliminated, and that healthier reactions can assist with resolving conflict rather than worsening situations.  Summary of Patient Progress:  The patient shared that their most recent time of anger was Thursday with her daughter and this was caused because her daughter was not understanding her, did not seem to want to understand, and continue to be untruthful with her.  Therapeutic Modalities:   Cognitive Behavioral Therapy  Lynnell ChadMareida J Grossman-Orr  08/09/2017 8:43 AM

## 2017-08-09 NOTE — [facility] Group Notes (Signed)
[facility] Group Notes:  (Nursing/MHT/Case Management/Adjunct)  Date:  08/09/2017  Time:  11:03 AM  Type of Therapy:  Nurse Education  Participation Level:  Active  Participation Quality:  Appropriate  Affect:  Appropriate  Cognitive:  Alert and Appropriate  Insight:  Appropriate  Engagement in Group:  Engaged  Modes of Intervention:  Discussion and Education  Summary of Progress/Problems:  Shela NevinValerie S Farzad Tibbetts 08/09/2017, 11:03 AM

## 2017-08-09 NOTE — Progress Notes (Signed)
D: Pt stated she was feeling better due to getting the medications right. Pt was visible in the dayroom interacting with her peers. Pt did not appear to be focused on the events that led [facility].  A: Pt was offered support and encouragement. Pt was given scheduled medications. Pt was encourage to attend groups. Q 15 minute checks were done for safety.  R:Pt attends groups and interacts well with peers and staff. Pt is taking medication. Pt has no complaints.Pt receptive to treatment and safety maintained on unit.

## 2017-08-09 NOTE — Plan of Care (Signed)
  Coping: Ability to cope will improve 08/09/2017 2337 - Progressing by Delos HaringPhillips, Tadd Holtmeyer A, RN Note Pt seen interacting with peers in the dayroom this evening
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
## 2017-08-10 LAB — LIPID PANEL
CHOL/HDL RATIO: 2.1 ratio
CHOLESTEROL: 135 mg/dL (ref 0–200)
HDL: 64 mg/dL (ref >40)
LDL Cholesterol: 67 mg/dL (ref 0–99)
Triglycerides: 21 mg/dL (ref <150)
VLDL: 4 mg/dL (ref 0–40)

## 2017-08-10 LAB — FOLATE: FOLATE: 12.4 ng/mL (ref >5.9)

## 2017-08-10 LAB — VITAMIN B12: Vitamin B-12: 637 pg/mL (ref 180–914)

## 2017-08-10 LAB — TSH: TSH: 1.134 u[IU]/mL (ref 0.350–4.500)

## 2017-08-10 MED ORDER — DULOXETINE HCL 60 MG PO CPEP
60.0000 mg | ORAL_CAPSULE | Freq: Every day | ORAL | Status: DC
  Administered 2017-08-11 – 2017-08-13 (×3): 60 mg via ORAL
  Filled 2017-08-10 (×5): qty 1

## 2017-08-10 MED ORDER — ARIPIPRAZOLE 10 MG PO TABS
10.0000 mg | ORAL_TABLET | Freq: Every day | ORAL | Status: DC
Start: 2017-08-11 — End: 2017-08-19
  Administered 2017-08-11 – 2017-08-18 (×8): 10 mg via ORAL
  Filled 2017-08-10: qty 1
  Filled 2017-08-10: qty 7
  Filled 2017-08-10 (×9): qty 1

## 2017-08-10 NOTE — [facility] Group Notes (Signed)
[facility] Group Notes:  (Nursing/MHT/Case Management/Adjunct)  Date:  08/10/2017  Time:  3:00 PM  Type of Therapy:  Psychoeducational Skills  Participation Level:  Active  Participation Quality:  Appropriate  Affect:  Appropriate  Cognitive:  Appropriate  Insight:  Appropriate  Engagement in Group:  Engaged  Modes of Intervention:  Problem-solving  Summary of Progress/Problems: Pt attended Psychoeducational group with top topic love languages.   Koreen Lizaola Shanta 08/10/2017, 3:00 PM 

## 2017-08-10 NOTE — [facility] Group Notes (Signed)
[facility] Therapy Note  Date/Time:  08/10/2017  11:00AM-12:00PM  Type of Therapy and Topic:  Group Therapy:  Music and Mood  Participation Level:  Active   Description of Group: In this process group, members listened to a variety of genres of music and identified that different types of music evoke different responses.  Patients were encouraged to identify music that was soothing for them and music that was energizing for them.  Patients discussed how this knowledge can help with wellness and recovery in various ways including managing depression and anxiety as well as encouraging healthy sleep habits.    Therapeutic Goals: 1. Patients will explore the impact of different varieties of music on mood 2. Patients will verbalize the thoughts they have when listening to different types of music 3. Patients will identify music that is soothing to them as well as music that is energizing to them 4. Patients will discuss how to use this knowledge to assist in maintaining wellness and recovery 5. Patients will explore the use of music as a coping skill  Summary of Patient Progress:  At the beginning of group, patient expressed feeling "peaceful" and at the end of group said she felt "more so."  Therapeutic Modalities: Solution Focused Brief Therapy Activity   Pamela MantleMareida Grossman-Orr, LCSW

## 2017-08-10 NOTE — Progress Notes (Signed)
Adult Psychoeducational Group Note  Date:  08/10/2017 Time:  9:18 PM  Group Topic/Focus:  Wrap-Up Group:   The focus of this group is to help patients review their daily goal of treatment and discuss progress on daily workbooks.  Participation Level:  Active  Participation Quality:  Appropriate  Affect:  Appropriate  Cognitive:  Alert and Oriented  Insight: Good  Engagement in Group:  Engaged  Modes of Intervention:  Discussion  Additional Comments:    Leo GrosserMegan A Gwendolin Briel 08/10/2017, 9:18 PM

## 2017-08-10 NOTE — Progress Notes (Signed)
Pt up talking with roommate

## 2017-08-10 NOTE — Progress Notes (Signed)
Writer spoke with patient 1:1 and she reports her day as being okay. She reports that she was not aware that her depression had gotten so bad. She has been observed up in the dayroom playing cards with peers. She reports her goal is to find another therapist that she feel comfortable with and is truly concerned about her. Support given and safety maintained on unit with 15 min checks.

## 2017-08-10 NOTE — [facility] Group Notes (Signed)
[facility] Group Notes:  (Nursing/MHT/Case Management/Adjunct)  Date:  08/10/2017  Time:  9:14 AM  Type of Therapy:  Goals/Orientation Group.  Participation Level:  Active  Participation Quality:  Appropriate  Affect:  Appropriate  Cognitive:  Appropriate  Insight:  Appropriate  Engagement in Group:  Engaged  Modes of Intervention:  Discussion  Summary of Progress/Problems: Pt attended goals/orientation group, pt was receptive.   Pamela Graham Shanta 08/10/2017, 9:14 AM 
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
## 2017-08-11 DIAGNOSIS — G4700 Insomnia, unspecified: Secondary | ICD-10-CM

## 2017-08-11 DIAGNOSIS — Z87891 Personal history of nicotine dependence: Secondary | ICD-10-CM

## 2017-08-11 DIAGNOSIS — F332 Major depressive disorder, recurrent severe without psychotic features: Principal | ICD-10-CM

## 2017-08-11 DIAGNOSIS — F419 Anxiety disorder, unspecified: Secondary | ICD-10-CM

## 2017-08-11 DIAGNOSIS — F39 Unspecified mood [affective] disorder: Secondary | ICD-10-CM

## 2017-08-11 LAB — URINALYSIS, COMPLETE (UACMP) WITH MICROSCOPIC
BILIRUBIN URINE: NEGATIVE
Glucose, UA: NEGATIVE mg/dL
HGB URINE DIPSTICK: NEGATIVE
Ketones, ur: 5 mg/dL — AB
LEUKOCYTES UA: NEGATIVE
NITRITE: POSITIVE — AB
Protein, ur: NEGATIVE mg/dL
RBC / HPF: NONE SEEN RBC/hpf (ref 0–5)
SPECIFIC GRAVITY, URINE: 1.025 (ref 1.005–1.030)
pH: 5 (ref 5.0–8.0)

## 2017-08-11 LAB — HEMOGLOBIN A1C
HEMOGLOBIN A1C: 5.5 % (ref 4.8–5.6)
MEAN PLASMA GLUCOSE: 111 mg/dL

## 2017-08-11 LAB — VITAMIN D 25 HYDROXY (VIT D DEFICIENCY, FRACTURES): Vit D, 25-Hydroxy: 17.3 ng/mL — ABNORMAL LOW (ref 30.0–100.0)

## 2017-08-11 MED ORDER — TRAZODONE HCL 100 MG PO TABS
100.0000 mg | ORAL_TABLET | Freq: Every evening | ORAL | Status: DC | PRN
  Administered 2017-08-11 – 2017-08-18 (×11): 100 mg via ORAL
  Filled 2017-08-11 (×9): qty 1
  Filled 2017-08-11: qty 14
  Filled 2017-08-11 (×7): qty 1
  Filled 2017-08-11: qty 14
  Filled 2017-08-11 (×2): qty 1

## 2017-08-11 NOTE — [facility] Group Notes (Signed)
[facility] LCSW Group Therapy Note  Date/Time: 08/11/17, 1315  Type of Therapy and Topic:  Group Therapy:  Overcoming Obstacles  Participation Level:  minimal  Description of Group:    In this group patients will be encouraged to explore what they see as obstacles to their own wellness and recovery. They will be guided to discuss their thoughts, feelings, and behaviors related to these obstacles. The group will process together ways to cope with barriers, with attention given to specific choices patients can make. Each patient will be challenged to identify changes they are motivated to make in order to overcome their obstacles. This group will be process-oriented, with patients participating in exploration of their own experiences as well as giving and receiving support and challenge from other group members.  Therapeutic Goals: 1. Patient will identify personal and current obstacles as they relate to admission. 2. Patient will identify barriers that currently interfere with their wellness or overcoming obstacles.  3. Patient will identify feelings, thought process and behaviors related to these barriers. 4. Patient will identify two changes they are willing to make to overcome these obstacles:    Summary of Patient Progress: pt had her eyes closed for most of group.  She did respond to CSW question and identified her obstacles as depression and "withdrawling from people", but she did not make any other contributions to the group discussion.      Therapeutic Modalities:   Cognitive Behavioral Therapy Solution Focused Therapy Motivational Interviewing Relapse Prevention Therapy  Daleen SquibbGreg Jhonatan Lomeli, LCSW

## 2017-08-11 NOTE — Plan of Care (Signed)
  Progressing Activity: Interest or engagement in activities will improve 08/11/2017 1015 - Progressing by Angela AdamBeaudry, Laray Rivkin E, RN Sleeping patterns will improve 08/11/2017 1015 - Progressing by Angela AdamBeaudry, Giuliana Handyside E, RN Education: Knowledge of [facility] Education information/materials will improve 08/11/2017 1015 - Progressing by Angela AdamBeaudry, Mahogony Gilchrest E, RN Emotional status will improve 08/11/2017 1015 - Progressing by Angela AdamBeaudry, Kamron Vanwyhe E, RN
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
## 2017-08-11 NOTE — Progress Notes (Signed)
Adult Psychoeducational Group Note  Date:  08/11/2017 Time:  8:41 PM  Group Topic/Focus:  Wrap-Up Group:   The focus of this group is to help patients review their daily goal of treatment and discuss progress on daily workbooks.  Participation Level:  Active  Participation Quality:  Appropriate  Affect:  Appropriate  Cognitive:  Appropriate  Insight: Appropriate  Engagement in Group:  Engaged  Modes of Intervention:  Discussion  Additional Comments:  The patient expressed that she rates today a 6.The patient also said that attend [facility].  Octavio Mannshigpen, Hansford Hirt Lee 08/11/2017, 8:41 PM

## 2017-08-11 NOTE — Progress Notes (Signed)
D: Patient is complaining of generalized body pain.  Her affect is flat, blunted; her mood is depressed.  She rates her depression as a 7; hopelessness and anxiety as a 6.  Patient reports her sleep and appetite are fair; her energy level is low and concentration is poor.  She denies any thoughts of self harm.  She is lethargic and slow moving this morning.  Her goal today is to address her "depression."  Patient is complaining of fibromyalgia pain.  She denies any thoughts of self harm.    A: Continue to monitor medication management and MD orders.  Safety checks completed every 15 minutes per protocol.  Offer support and encouragement as needed.  R: Patient is withdrawn; minimal and isolative to room. She has been encouraged to attend groups.
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
## 2017-08-11 NOTE — Tx Team (Signed)
Interdisciplinary Treatment and Diagnostic Plan Update  08/11/2017 Time of Session: 10:00a Pamela Graham MRN: [id number]  Principal Diagnosis: Bipolar Disorder, Depressed   Secondary Diagnoses: Active Problems:   Major depressive disorder, recurrent severe without psychotic features (HCC)   Current Medications:  Current Facility-Administered Medications  Medication Dose Route Frequency Provider Last Rate Last Dose  . acetaminophen (TYLENOL) tablet 650 mg  650 mg Oral Q4H PRN Charm Rings, NP   650 mg at 08/11/17 0804  . alum & mag hydroxide-simeth (MAALOX/MYLANTA) 200-200-20 MG/5ML suspension 30 mL  30 mL Oral Q6H PRN Charm Rings, NP      . ARIPiprazole (ABILIFY) tablet 10 mg  10 mg Oral Daily Cobos, Rockey Situ, MD   10 mg at 08/11/17 0803  . DULoxetine (CYMBALTA) DR capsule 60 mg  60 mg Oral Daily Cobos, Rockey Situ, MD   60 mg at 08/11/17 0803  . hydrOXYzine (ATARAX/VISTARIL) tablet 25 mg  25 mg Oral TID PRN Jackelyn Poling, NP   25 mg at 08/10/17 2237  . magnesium hydroxide (MILK OF MAGNESIA) suspension 30 mL  30 mL Oral Daily PRN Charm Rings, NP      . ondansetron [facility]) tablet 4 mg  4 mg Oral Q8H PRN Charm Rings, NP      . traZODone (DESYREL) tablet 50 mg  50 mg Oral QHS PRN Cobos, Rockey Situ, MD   50 mg at 08/10/17 2237   PTA Medications: Medications Prior to Admission  Medication Sig Dispense Refill Last Dose  . HYDROcodone-acetaminophen (NORCO) 5-325 MG per tablet Take 1 tablet by mouth every 6 (six) hours as needed (for pain; may cause constipation). (Patient not taking: Reported on 08/07/2017) 10 tablet 0 Not Taking at Unknown time    Patient Stressors: Health problems Marital or family conflict  Patient Strengths: Wellsite geologist fund of knowledge Religious Affiliation  Treatment Modalities: Medication Management, Group therapy, Case management,  1 to 1 session with clinician, Psychoeducation, Recreational therapy.   Physician Treatment  Plan for Primary Diagnosis: <principal problem not specified> Long Term Goal(s): Improvement in symptoms so as ready for discharge Improvement in symptoms so as ready for discharge   Short Term Goals: Ability to identify changes in lifestyle to reduce recurrence of condition will improve Ability to maintain clinical measurements within normal limits will improve Ability to identify changes in lifestyle to reduce recurrence of condition will improve Ability to verbalize feelings will improve Ability to disclose and discuss suicidal ideas Ability to demonstrate self-control will improve Ability to identify and develop effective coping behaviors will improve Ability to maintain clinical measurements within normal limits will improve  Medication Management: Evaluate patient's response, side effects, and tolerance of medication regimen.  Therapeutic Interventions: 1 to 1 sessions, Unit Group sessions and Medication administration.  Evaluation of Outcomes: Progressing  Physician Treatment Plan for Secondary Diagnosis: Active Problems:   Major depressive disorder, recurrent severe without psychotic features (HCC)  Long Term Goal(s): Improvement in symptoms so as ready for discharge Improvement in symptoms so as ready for discharge   Short Term Goals: Ability to identify changes in lifestyle to reduce recurrence of condition will improve Ability to maintain clinical measurements within normal limits will improve Ability to identify changes in lifestyle to reduce recurrence of condition will improve Ability to verbalize feelings will improve Ability to disclose and discuss suicidal ideas Ability to demonstrate self-control will improve Ability to identify and develop effective coping behaviors will improve Ability to maintain clinical measurements within  normal limits will improve     Medication Management: Evaluate patient's response, side effects, and tolerance of medication  regimen.  Therapeutic Interventions: 1 to 1 sessions, Unit Group sessions and Medication administration.  Evaluation of Outcomes: Progressing     RN Treatment Plan for Primary Diagnosis: Bipolar Disorder, Depressed   Long Term Goal(s): Knowledge of disease and therapeutic regimen to maintain health will improve  Short Term Goals: Ability to remain free from injury will improve, Ability to verbalize frustration and anger appropriately will improve, Ability to demonstrate self-control, Ability to participate in decision making will improve, Ability to verbalize feelings will improve, Ability to disclose and discuss suicidal ideas, Ability to identify and develop effective coping behaviors will improve and Compliance with prescribed medications will improve  Medication Management: RN will administer medications as ordered by provider, will assess and evaluate patient's response and provide education to patient for prescribed medication. RN will report any adverse and/or side effects to prescribing provider.  Therapeutic Interventions: 1 on 1 counseling sessions, Psychoeducation, Medication administration, Evaluate responses to treatment, Monitor vital signs and CBGs as ordered, Perform/monitor CIWA, COWS, AIMS and Fall Risk screenings as ordered, Perform wound care treatments as ordered.  Evaluation of Outcomes: Progressing   LCSW Treatment Plan for Primary Diagnosis:Bipolar Disorder, Depressed   Long Term Goal(s): Safe transition to appropriate next level of care at discharge, Engage patient in therapeutic group addressing interpersonal concerns.  Short Term Goals: Engage patient in aftercare planning with referrals and resources, Increase social support, Increase ability to appropriately verbalize feelings, Increase emotional regulation, Facilitate acceptance of mental health diagnosis and concerns, Facilitate patient progression through stages of change regarding substance use diagnoses and  concerns, Identify triggers associated with mental health/substance abuse issues and Increase skills for wellness and recovery  Therapeutic Interventions: Assess for all discharge needs, 1 to 1 time with Social worker, Explore available resources and support systems, Assess for adequacy in community support network, Educate family and significant other(s) on suicide prevention, Complete Psychosocial Assessment, Interpersonal group therapy.  Evaluation of Outcomes: Progressing   Progress in Treatment: Attending groups: Yes. Participating in groups: Yes. Taking medication as prescribed: Yes. Toleration medication: Yes. Family/Significant other contact made: No, will contact:  CSW will continue to assess for an appropiate contact. Patient has not given consent at this time.  Patient understands diagnosis: Yes. Discussing patient identified problems/goals with staff: Yes. Medical problems stabilized or resolved: Yes. Denies suicidal/homicidal ideation: Yes. Issues/concerns per patient self-inventory: Yes. Other:   New problem(s) identified: None  New Short Term/Long Term Goal(s):  medication stabilization, elimination of SI thoughts, development of comprehensive mental wellness plan.   Patient Goal: ???  Discharge Plan or Barriers: Return home with her daughter and follow up with [facility].   Reason for Continuation of Hospitalization: Anxiety Depression Medication stabilization  Estimated Length of Stay: Wednesday, 08/13/17  Attendees: Patient: Pamela Graham 08/11/2017 11:14 AM  Physician: Dr. Nehemiah MassedFernando Cobos 08/11/2017 11:14 AM  Nursing: Rayfield Citizenaroline, RN  08/11/2017 11:14 AM  RN Care Manager: Juliann ParesX 08/11/2017 11:14 AM  Social Worker: Baldo DaubJolan Berdena Cisek, LCSWA 08/11/2017 11:14 AM  Recreational Therapist: Juliann ParesX 08/11/2017 11:14 AM  Other:  08/11/2017 11:14 AM  Other:  08/11/2017 11:14 AM  Other: 08/11/2017 11:14 AM    Scribe for Treatment Team: Maeola SarahJolan E Dimitrios Balestrieri, LCSWA 08/11/2017 11:14 AM

## 2017-08-12 NOTE — Progress Notes (Signed)
D: When asked about his day pt stated, "I'm taking advantage of the group therapies, talking to my doctors, and to get my medicines back under control". Pt has no questions or concerns.    A:  Support and encouragement was offered. 15 min checks continued for safety.  R: Pt remains safe.

## 2017-08-12 NOTE — Progress Notes (Signed)
CSW contacted Mordecai MaesKashina Cisco at Dr Theophilus KindsIzzy's request to discuss discharge.  CSW informed Juluis RainierKashina that the MD is considering discharge tomorrow and she immediately became upset.  She said she has concerns because pt feet are swollen and pt sounded groggy on the phone when she spoke to her this AM.  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  Juluis RainierKashina said that pt "has no money, no insurance" and that this makes it impossible "for her to come to my house."  She asked to speak to the MD and CSW told her that I will let him know. Garner NashGregory Renad Jenniges, MSW, LCSW Clinical Social Worker 08/12/2017 2:18 PM

## 2017-08-12 NOTE — Progress Notes (Signed)
Patient ID: Pamela RuaKim Graham, female   DOB: 12/12/1955, 62 y.o.   MRN: [id number] PER STATE REGULATIONS 482.30  THIS CHART WAS REVIEWED FOR MEDICAL NECESSITY WITH RESPECT TO THE PATIENT'S ADMISSION/ DURATION OF STAY.  NEXT REVIEW DATE: 08/16/2017 Willa RoughJENNIFER JONES Niall Illes, RN, BSN CASE MANAGER

## 2017-08-12 NOTE — [facility] Group Notes (Signed)
LCSW Group Therapy Note 08/12/2017 12:45 PM  Type of Therapy/Topic: Group Therapy: Feelings about Diagnosis  Participation Level: Active   Description of Group:  This group will allow patients to explore their thoughts and feelings about diagnoses they have received. Patients will be guided to explore their level of understanding and acceptance of these diagnoses. Facilitator will encourage patients to process their thoughts and feelings about the reactions of others to their diagnosis and will guide patients in identifying ways to discuss their diagnosis with significant others in their lives. This group will be process-oriented, with patients participating in exploration of their own experiences, giving and receiving support, and processing challenge from other group members.  Therapeutic Goals: 1. Patient will demonstrate understanding of diagnosis as evidenced by identifying two or more symptoms of the disorder 2. Patient will be able to express two feelings regarding the diagnosis 3. Patient will demonstrate their ability to communicate their needs through discussion and/or role play  Summary of Patient Progress:  Pamela Graham was engaged throughout the group session. She participated and contributed to the group's discussion. Pamela Graham stated that when she learned she had a mental health diagnosis, her inital feeling was fear. Pamela Graham stated that she continue to experience fear and shame for being diagnosed with a mental illness because of the lack of understanding she has experienced from loved ones, professionals and society. Pamela Graham states that if society had a better understanding of mental health, it would not be such an issue.      Therapeutic Modalities:  Cognitive Behavioral Therapy Brief Therapy Feelings Identification    Orton Capell Catalina AntiguaWilliams LCSWA Clinical Social Worker

## 2017-08-12 NOTE — [facility] Group Notes (Signed)
[facility] Group Notes:  (Nursing/MHT/Case Management/Adjunct)  Date:  08/12/2017  Time:  4:00 p.m.  Type of Therapy:  Nurse Education  Participation Level:  Active  Participation Quality:  Appropriate  Affect:  Appropriate  Cognitive:  Appropriate  Insight:  Appropriate  Engagement in Group:  Engaged  Modes of Intervention:  Education  Summary of Progress/Problems:  Group consisted of relaxation techniques.   Earline MayotteKnight, Maty Zeisler Shephard 08/12/2017, 5:07 PM
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
## 2017-08-13 DIAGNOSIS — R450 Nervousness: Secondary | ICD-10-CM

## 2017-08-13 DIAGNOSIS — R45851 Suicidal ideations: Secondary | ICD-10-CM

## 2017-08-13 LAB — URINE CULTURE: Culture: >=100000 — AB

## 2017-08-13 MED ORDER — NITROFURANTOIN MONOHYD MACRO 100 MG PO CAPS
100.0000 mg | ORAL_CAPSULE | Freq: Two times a day (BID) | ORAL | Status: AC
  Administered 2017-08-13 – 2017-08-15 (×6): 100 mg via ORAL
  Filled 2017-08-13 (×6): qty 1

## 2017-08-13 MED ORDER — DULOXETINE HCL 30 MG PO CPEP
90.0000 mg | ORAL_CAPSULE | Freq: Every day | ORAL | Status: DC
  Administered 2017-08-13: 30 mg via ORAL
  Administered 2017-08-14 – 2017-08-18 (×5): 90 mg via ORAL
  Filled 2017-08-13 (×4): qty 3
  Filled 2017-08-13: qty 21
  Filled 2017-08-13 (×3): qty 3

## 2017-08-13 MED ORDER — DULOXETINE HCL 30 MG PO CPEP
ORAL_CAPSULE | ORAL | Status: AC
  Filled 2017-08-13: qty 1

## 2017-08-13 NOTE — Progress Notes (Signed)
CSW wrote requested letter for social security and again discussed this with pt, who is in agreement that her daughter will take this to social security office for her.  CSW spoke to daughter, Juluis RainierKashina, who will pick the letter up and take it to social security today.  CSW again informed her that most likely pt will be ready for discharge soon.  Juluis RainierKashina is trying to make arrangements for RN help in the home and will call back after meeting with social security with the status there. Garner NashGregory Ry Moody, MSW, LCSW Clinical Social Worker 08/13/2017 8:56 AM

## 2017-08-13 NOTE — Progress Notes (Signed)
Recreation Therapy Notes  Date: 08/13/17 Time: 0930 Location: [address] Dayroom  Group Topic: Stress Management  Goal Area(s) Addresses:  Patient will verbalize importance of using healthy stress management.  Patient will identify positive emotions associated with healthy stress management.   Intervention: Stress Management  Activity :  Progressive Muscle Relaxation.  LRT introduced the stress management technique of progressive muscle relaxation.  LRT read a script to guide the patients through the process of tensing and relaxing each muscle group individually.    Education:  Stress Management, Discharge Planning.   Education Outcome: Acknowledges edcuation/In group clarification offered/Needs additional education  Clinical Observations/Feedback: Pt did not attend group.     Caretha Rumbaugh, LRT/CTRS         Chikita Dogan A 08/13/2017 12:00 PM 

## 2017-08-13 NOTE — Progress Notes (Signed)
Psychoeducational Group Note  Date:  08/13/2017 Time:  0934  Group Topic/Focus:  Goals Group:   The focus of this group is to help patients establish daily goals to achieve during treatment and discuss how the patient can incorporate goal setting into their daily lives to aide in recovery.  Participation Level: Did Not Attend  Participation Quality:  Not Applicable  Affect:  Not Applicable  Cognitive:  Not Applicable  Insight:  Not Applicable  Engagement in Group: Not Applicable  Additional Comments:  Pt was asleep and unable to attend group this morninig.  Anay Rathe E 08/13/2017, 9:34 AM

## 2017-08-13 NOTE — Progress Notes (Signed)
Pt attend wrap up group. Pt said her day was a 7. Her goal to go to group stay positive and work on her depression. Pt said she need some cloths from the donation area. Staff pass on to RN clothing for pt.

## 2017-08-13 NOTE — [facility] Group Notes (Signed)
[facility] Group Therapy 08/13/2017 9:00am  Type of Therapy: Mental Health Association Presentation  Participation Level: Active  Participation Quality: Attentive  Affect: Appropriate  Cognitive: Oriented  Insight: Developing/Improving  Engagement in Therapy: Engaged  Modes of Intervention: Discussion, Education and Socialization  Summary of Progress/Problems: Mental Health Association (MHA) Speaker came to talk about his personal journey with mental health. The pt processed ways by which to relate to the speaker. MHA speaker provided handouts and educational information pertaining to groups and services offered by the [facility]. Pt was engaged in speaker's presentation and was receptive to resources provided.    Lorri FrederickWierda, Luanne Krzyzanowski Jon, LCSW 08/13/2017 12:45 PM

## 2017-08-13 NOTE — Progress Notes (Signed)
RN explain the donation clothing are for pt that has no clothing nothing. This pt has clothing.

## 2017-08-13 NOTE — Progress Notes (Signed)
Pt daughter call this evening voicing concerns about mother's poor appetite and   edema to her legs and feet. Writer spoke with pt, pt expressed that she told the NP about the swelling in her legs and feet, and was encouraged to elevate her legs. Pt noted to have minimal edema to her right foot. Writer elevated pt right foot with towels. Writer notified MHT on the hall to monitor pt intake at meal times.

## 2017-08-13 NOTE — Progress Notes (Signed)
D    Pt endorses depression anxiety and sadness    She has some mild thought blocking  And poor concentration    A    Verbal support given   Medications administered and effectiveness monitored       Q 15 min checks R   Pt is safe at present and was receptive to verbal support

## 2017-08-13 NOTE — [facility] Group Notes (Signed)
[facility] Group Therapy 08/13/2017 1:15pm  Type of Therapy: Mental Health Association Presentation  Participation Level: Active  Participation Quality: Attentive  Affect: Appropriate  Cognitive: Oriented  Insight: Developing/Improving  Engagement in Therapy: Engaged  Modes of Intervention: Discussion, Education and Socialization  Summary of Progress/Problems: Mental Health Association (MHA) Speaker came to talk about his personal journey with mental health. The pt processed ways by which to relate to the speaker. MHA speaker provided handouts and educational information pertaining to groups and services offered by the [facility]. Pt was engaged in speaker's presentation and was receptive to resources provided.    Pulte HomesHeather N Smart, LCSW 08/13/2017 9:30 AM

## 2017-08-13 NOTE — Progress Notes (Signed)
D    Pt endorses depression anxiety and sadness    She has some mild thought blocking   Pt did request information regarding what beverage or foods she could eat to help raise her K + level  A    Verbal support given   Medications administered and effectiveness monitored    Discussed foods high in K+ and pt said she has had some of those today   Q 15 min checks R   Pt is safe at present and was receptive to verbal support

## 2017-08-13 NOTE — Progress Notes (Signed)
Adult Psychoeducational Group Note  Date:  08/13/2017 Time: 1400 Group Topic/Focus:  Coping With Mental Health Crisis:   The purpose of this group is to help patients identify strategies for coping with mental health crisis.  Group discusses possible causes of crisis and ways to manage them effectively.  Participation Level:  Active  Participation Quality:  Appropriate  Affect:  Appropriate  Cognitive:  Appropriate  Insight: Appropriate  Engagement in Group:  Engaged  Modes of Intervention:  Discussion  Additional Comments:    Ajai Terhaar L 08/13/2017,   

## 2017-08-13 NOTE — Progress Notes (Signed)
Pt presents with a flat affect and depressed mood. Pt rates depression 7/10. Anxiety 6/10. Pt endorses passive SI with no plan or intent. Pt verbally contracts for safety. Pt appeared sad, with poor hygiene and disheleved on approach. Pt expressed feeling "doomed" this morning. Pt expressed mood swings of feeling fine and then depressed. Pt reported difficulty sleeping last night. Medications reviewed with pt. Medications administered as ordered per MD. Verbal support provided. 15 minute checks performed for safety.

## 2017-08-13 NOTE — Progress Notes (Signed)
[facility] MD Progress Note  08/13/2017 1:42 PM Pamela Graham  MRN:  [id number]   Subjective:  Patient reports that she is still feeling very depressed and has anxiety. She rates her depression at 7/10 and her anxiety at 6/10. She reports that she die not sleep well. She states that she has been eating good. She denies any HI/AVH and contracts for safety, but reports passive SI. She denies any medication side effects.    Objective: Patient's chart and findings reviewed and discussed with treatment team. Patient presents flat and depressed.She has been in the day room and interacting with peers and staff. Will increase Cymbalta to 90 mg Daily and will continue all other medications. She has difficulty with coping skills and I have requested peer support to speak with her as well.  Principal Problem: Major depressive disorder, recurrent severe without psychotic features (HCC) Diagnosis:   Patient Active Problem List   Diagnosis Date Noted  . Major depressive disorder, recurrent severe without psychotic features (HCC) [F33.2] 08/08/2017   Total Time spent with patient: 30 minutes  Past Psychiatric History: See H&P  Past Medical History:  Past Medical History:  Diagnosis Date  . Barrett's esophagus   . Fibromyalgia   . Gastric ulcer   . Hypertension   . Migraines     Past Surgical History:  Procedure Laterality Date  . ABDOMINAL HYSTERECTOMY    . COLONOSCOPY     Family History: History reviewed. No pertinent family history. Family Psychiatric  History: See H&P Social History:  Social History   Substance and Sexual Activity  Alcohol Use Yes     Social History   Substance and Sexual Activity  Drug Use No    Social History   Socioeconomic History  . Marital status: Single    Spouse name: None  . Number of children: None  . Years of education: None  . Highest education level: None  Social Needs  . Financial resource strain: None  . Food insecurity - worry: None  . Food  insecurity - inability: None  . Transportation needs - medical: None  . Transportation needs - non-medical: None  Occupational History  . None  Tobacco Use  . Smoking status: Former Games developer  . Smokeless tobacco: Never Used  Substance and Sexual Activity  . Alcohol use: Yes  . Drug use: No  . Sexual activity: Not Currently  Other Topics Concern  . None  Social History Narrative  . None   Additional Social History:            Sleep: Fair  Appetite:  Good  Current Medications: Current Facility-Administered Medications  Medication Dose Route Frequency Provider Last Rate Last Dose  . acetaminophen (TYLENOL) tablet 650 mg  650 mg Oral Q4H PRN Charm Rings, NP   650 mg at 08/13/17 0816  . alum & mag hydroxide-simeth (MAALOX/MYLANTA) 200-200-20 MG/5ML suspension 30 mL  30 mL Oral Q6H PRN Charm Rings, NP      . ARIPiprazole (ABILIFY) tablet 10 mg  10 mg Oral Daily Cobos, Rockey Situ, MD   10 mg at 08/13/17 4098  . [START ON 08/14/2017] DULoxetine (CYMBALTA) DR capsule 90 mg  90 mg Oral Daily Glenice Ciccone, Gerlene Burdock, FNP      . hydrOXYzine (ATARAX/VISTARIL) tablet 25 mg  25 mg Oral TID PRN Nira Conn A, NP   25 mg at 08/12/17 2304  . magnesium hydroxide (MILK OF MAGNESIA) suspension 30 mL  30 mL Oral Daily PRN Charm Rings,  NP      . nitrofurantoin (macrocrystal-monohydrate) (MACROBID) capsule 100 mg  100 mg Oral Q12H Donell Sievert E, PA-C   100 mg at 08/13/17 1610  . ondansetron (ZOFRAN) tablet 4 mg  4 mg Oral Q8H PRN Charm Rings, NP      . traZODone (DESYREL) tablet 100 mg  100 mg Oral QHS,MR X 1 Kerry Hough, PA-C   100 mg at 08/12/17 2304    Lab Results: No results found for this or any previous visit (from the past 48 hour(s)).  Blood Alcohol level:  Lab Results  Component Value Date   ETH <10 08/07/2017   ETH  03/14/2010    <10        LOWEST DETECTABLE LIMIT FOR SERUM ALCOHOL IS 5 mg/dL FOR MEDICAL PURPOSES ONLY    Metabolic Disorder Labs: Lab Results   Component Value Date   HGBA1C 5.5 08/10/2017   MPG 111 08/10/2017   No results found for: PROLACTIN Lab Results  Component Value Date   CHOL 135 08/10/2017   TRIG 21 08/10/2017   HDL 64 08/10/2017   CHOLHDL 2.1 08/10/2017   VLDL 4 08/10/2017   LDLCALC 67 08/10/2017    Physical Findings: AIMS: Facial and Oral Movements Muscles of Facial Expression: None, normal Lips and Perioral Area: None, normal Jaw: None, normal Tongue: None, normal,Extremity Movements Upper (arms, wrists, hands, fingers): None, normal Lower (legs, knees, ankles, toes): None, normal, Trunk Movements Neck, shoulders, hips: None, normal, Overall Severity Severity of abnormal movements (highest score from questions above): None, normal Incapacitation due to abnormal movements: None, normal Patient's awareness of abnormal movements (rate only patient's report): No Awareness, Dental Status Current problems with teeth and/or dentures?: No Does patient usually wear dentures?: No  CIWA:    COWS:     Musculoskeletal: Strength & Muscle Tone: within normal limits Gait & Station: normal Patient leans: N/A  Psychiatric Specialty Exam: Physical Exam  Nursing note and vitals reviewed. Constitutional: She is oriented to person, place, and time. She appears well-developed and well-nourished.  Cardiovascular: Normal rate.  Respiratory: Effort normal.  Musculoskeletal: Normal range of motion.  Neurological: She is alert and oriented to person, place, and time.  Skin: Skin is warm.    Review of Systems  Constitutional: Negative.   HENT: Negative.   Eyes: Negative.   Respiratory: Negative.   Cardiovascular: Negative.   Gastrointestinal: Negative.   Genitourinary: Negative.   Musculoskeletal: Negative.   Skin: Negative.   Neurological: Negative.   Endo/Heme/Allergies: Negative.   Psychiatric/Behavioral: Positive for depression and suicidal ideas. Negative for hallucinations. The patient is nervous/anxious and  has insomnia.     Blood pressure 114/72, pulse 99, temperature 98.6 F (37 C), temperature source Oral, resp. rate 18, height 5\' 6"  (1.676 m), weight 65.5 kg (144 lb 8 oz), SpO2 97 %.Body mass index is 23.32 kg/m.  General Appearance: Casual  Eye Contact:  Good  Speech:  Clear and Coherent and Normal Rate  Volume:  Decreased  Mood:  Depressed  Affect:  Depressed and Flat  Thought Process:  Goal Directed and Descriptions of Associations: Intact  Orientation:  Full (Time, Place, and Person)  Thought Content:  WDL  Suicidal Thoughts:  Yes.  without intent/plan  Homicidal Thoughts:  No  Memory:  Immediate;   Good Recent;   Good Remote;   Good  Judgement:  Good  Insight:  Good  Psychomotor Activity:  Normal  Concentration:  Concentration: Good and Attention Span: Good  Recall:  Good  Fund of Knowledge:  Good  Language:  Good  Akathisia:  NA  Handed:  Right  AIMS (if indicated):     Assets:  Communication Skills Desire for Improvement Financial Resources/Insurance Housing Physical Health Social Support Transportation  ADL's:  Intact  Cognition:  WNL  Sleep:  Number of Hours: 6.25   Problems Addressed: MDD severe  Treatment Plan Summary: Daily contact with patient to assess and evaluate symptoms and progress in treatment, Medication management and Plan is to:  -Increase Cymbalta 90 mg PO Daily for mood control -Continue Abilify 10 mg PO Daily for mood stability -Continue Vistaril 25 mg TID PRN for anxiety -Continue Trazodone 100 mg PO QHS PRN for insomnia -Encourage group therapy participation  Maryfrances Bunnellravis B Duran Ohern, FNP 08/13/2017, 1:42 PM

## 2017-08-14 MED ORDER — NAPROXEN 500 MG PO TABS
500.0000 mg | ORAL_TABLET | Freq: Two times a day (BID) | ORAL | Status: DC
  Administered 2017-08-14 – 2017-08-21 (×14): 500 mg via ORAL
  Filled 2017-08-14 (×18): qty 1

## 2017-08-14 NOTE — Progress Notes (Signed)
Pt attend wrap up group her day was a 7. Her goal was to attend groups, focus on positive issues. She had a good talk with her doctor and the social worker today.

## 2017-08-14 NOTE — Progress Notes (Signed)
Adult Psychoeducational Group Note  Date:  08/14/2017 Time:  10:46 AM  Group Topic/Focus:  Crisis Planning:   The purpose of this group is to help patients create a crisis plan for use upon discharge or in the future, as needed.  Participation Level:  Active  Participation Quality:  Drowsy  Affect:  Depressed  Cognitive:  Appropriate  Insight: Improving   Engagement in Group:  Improving  Modes of Intervention:  Discussion and Problem-solving    Pamela Graham 08/14/2017, 10:46 AM
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
## 2017-08-14 NOTE — Progress Notes (Signed)
Adult Psychoeducational Group Note  Date:  08/14/2017 Time: 1600  Group Topic/Focus:  Aromatherapy  Participation Level:  Active  Participation Quality:  Appropriate  Affect:  Appropriate  Cognitive:  Appropriate  Insight: Appropriate  Engagement in Group:  Engaged  Modes of Intervention:  Education  Additional Comments:    Jaidan Prevette L 08/14/2017 

## 2017-08-14 NOTE — [facility] Group Notes (Signed)
LCSW Group Therapy Note 08/14/2017 12:43 PM  Type of Therapy/Topic: Group Therapy: Balance in Life  Participation Level: Active  Description of Group:  This group will address the concept of balance and how it feels and looks when one is unbalanced. Patients will be encouraged to process areas in their lives that are out of balance and identify reasons for remaining unbalanced. Facilitators will guide patients in utilizing problem-solving interventions to address and correct the stressor making their life unbalanced. Understanding and applying boundaries will be explored and addressed for obtaining and maintaining a balanced life. Patients will be encouraged to explore ways to assertively make their unbalanced needs known to significant others in their lives, using other group members and facilitator for support and feedback.  Therapeutic Goals: 1. Patient will identify two or more emotions or situations they have that consume much of in their lives. 2. Patient will identify signs/triggers that life has become out of balance:  3. Patient will identify two ways to set boundaries in order to achieve balance in their lives:  4. Patient will demonstrate ability to communicate their needs through discussion and/or role plays  Summary of Patient Progress:  Pamela Graham was engaged throughout the group session. She participated and contributed to the group's discussion. Pamela Graham states that balance is "staying grounded". She states that stressing about getting old is something that causes her to become unbalanced. She states that often isolates when she is unbalanced. Pamela Graham states that she plans to learn how to accept becoming older and will continue to regulate her depression by following up with her psychiatrist to maintain balance at discharge.     Therapeutic Modalities:  Cognitive Behavioral Therapy Solution-Focused Therapy Assertiveness Training   Pamela Graham LCSWA Clinical Social Worker

## 2017-08-14 NOTE — Progress Notes (Signed)
Pt presents with a flat affect and depressed mood. Pt appears depressed, sad and withdrawn on approach. Pt reports ongoing depression and feeling "doomed". Pt reports passive SI and verbally contracts for safety. Pt c/o Fibromyalgia pain this am and requested something else for pain other than Tylenol. Treatment made aware of pt complaints. Tx team and Feliz Beamravis, NP., made aware of pt complaint of swelling in LE. Pt noted to have minimal non-pitting edema to right ankle and foot. Pt encouraged to elevate right foot and to limit walking and pressure to right foot. Medications reviewed with pt. Medications administered as ordered per MD. Verbal support provided. Pt encouraged to attend groups. 15 minute checks performed for safety.

## 2017-08-14 NOTE — Progress Notes (Signed)
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 Staff explain to pt I do not give out clothing. The clothing are for pt that do not have no clothing items or family to bring clothing.

## 2017-08-14 NOTE — Progress Notes (Signed)
D   Pt brightens on approach and is cooperative   She has been visible in the dayroom socializing with peers    She continues to have some poor concentration and mild thought blocking  A   Verbal support given   Medications administered and effectiveness monitored    Q 15 min checks R    Pt is safe at present time

## 2017-08-14 NOTE — Progress Notes (Signed)
Encouraged pt to take her repeat dose of trazodone since she said she did not sleep the night before but she expressed she was afraid because it was a large does    Explained dosing of this medication to patient and she verbalized understanding   She did sleep with the single dose of trazodone

## 2017-08-14 NOTE — Progress Notes (Signed)
Pt attend wrap up group. Her day was a 6. Pt said she wants to get her pain leverl under control. She has new medication and she wants this to work for her.

## 2017-08-15 NOTE — Progress Notes (Signed)
D:  Patient's self inventory sheet, patient has fair sleep, sleep medication helpful.  Fair appetite, low energy level, poor concentration.  Rated depression 6.5, hopeless and anxiety 7.  Denied withdrawals.  Denied SI.  Pain, shoulders, back, knees, body pain, worst pain in past 24 hrs is #6.  Pain medicine helpful, tylenol, naproxyn.  Goal is depression, anxiety, body pain.  Plans to attend groups.  Does have discharge plans.   A:  Medications administered per MD orders.  Emotional support and encouragement given patient. R:  Patient denied SI and HI, contracts for safety.  Denied A/V hallucinations.  Safety maintained with 15 minute checks.

## 2017-08-15 NOTE — [facility] Group Notes (Signed)
  [facility] LCSW Group Therapy Note  Date/Time: 08/15/17, 1315  Type of Therapy/Topic:  Group Therapy:  Emotion Regulation  Participation Level:  Minimal   Mood: pleasant  Description of Group:    The purpose of this group is to assist patients in learning to regulate negative emotions and experience positive emotions. Patients will be guided to discuss ways in which they have been vulnerable to their negative emotions. These vulnerabilities will be juxtaposed with experiences of positive emotions or situations, and patients challenged to use positive emotions to combat negative ones. Special emphasis will be placed on coping with negative emotions in conflict situations, and patients will process healthy conflict resolution skills.  Therapeutic Goals: 1. Patient will identify two positive emotions or experiences to reflect on in order to balance out negative emotions:  2. Patient will label two or more emotions that they find the most difficult to experience:  3. Patient will be able to demonstrate positive conflict resolution skills through discussion or role plays:   Summary of Patient Progress:Pt shared that feeling misunderstood is one emotion that pt finds difficult to experience.  Pt was not active in discussion regarding positive ways to deal with difficult emotions and had her eyes closed most of the time.        Therapeutic Modalities:   Cognitive Behavioral Therapy Feelings Identification Dialectical Behavioral Therapy  Daleen SquibbGreg Anthony Tamburo, LCSW

## 2017-08-15 NOTE — Progress Notes (Signed)
CSW spoke with pt daughter, Mordecai Maes.  Social security accepted MD letter and benefits will be reinstated in 3-5 days, which helps.  She asked about ACT team services and CSW informed her that PSI said pt does not meet criteria for ACT, which we had discussed earlier as well.  CSW told Juluis Rainier that pt will most likely discharge today.  Juluis Rainier said she would really like for pt to be delayed until Monday.  CSW to attend progression meeting to discuss with MD. Garner Nash, MSW, LCSW Clinical Social Worker 08/15/2017 10:08 AM   CSW called Juluis Rainier back to inform that MD wants to delay discharge until Monday and left a message to this effect. Garner Nash, MSW, LCSW Clinical Social Worker 08/15/2017 10:08 AM

## 2017-08-15 NOTE — Plan of Care (Signed)
Nurse discussed depression, anxiety, coping skills with patient.  

## 2017-08-15 NOTE — Progress Notes (Signed)
Recreation Therapy Notes  Date: 08/15/17 Time: 0930 Location: [address] Dayroom  Group Topic: Stress Management  Goal Area(s) Addresses:  Patient will verbalize importance of using healthy stress management.  Patient will identify positive emotions associated with healthy stress management.   Intervention: Stress Management  Activity :  Body Scan.  LRT introduced the stress management technique meditation.  LRT played a body scan meditation to allow patients to examine any sensations or tension they may have been feeling.  Patients were to follow along as the meditation played.  Education:  Stress Management, Discharge Planning.   Education Outcome: Acknowledges edcuation/In group clarification offered/Needs additional education  Clinical Observations/Feedback: Pt did not attend group.    Caroll RancherMarjette Kiora Hallberg, LRT/CTRS         Caroll RancherLindsay, Dozier Berkovich A 08/15/2017 10:46 AM

## 2017-08-15 NOTE — Progress Notes (Signed)
Patient ID: Pamela Graham, female   DOB: 09/03/1955, 62 y.o.   MRN: [id number]  Pt currently presents with an anxious affect and cooperative behavior. Pt mood is labile, irritable at times and pleasant and joking at others. Pt interacts minimally but positively with peers.  Pt reports reports sleep with current medication regimen. Reports verbal interest in performing better self care but is not seen in engaging in any of those activities.   Pt provided with medications per providers orders. Pt's labs and vitals were monitored throughout the night. Pt given a 1:1 about emotional and mental status. Pt supported and encouraged to express concerns and questions. Pt educated on medications.   Pt's safety ensured with 15 minute and environmental checks. Pt currently denies SI/HI and A/V hallucinations. Pt verbally agrees to seek staff if SI/HI or A/VH occurs and to consult with staff before acting on any harmful thoughts. Will continue POC.

## 2017-08-15 NOTE — [facility] Group Notes (Signed)
[facility] Group Notes:  (Nursing/MHT/Case Management/Adjunct)  Date:  08/15/2017  Time:  4:00 p.m.  Type of Therapy:  Psychoeducational Skills  Participation Level:  Active  Participation Quality:  Appropriate  Affect:  Appropriate  Cognitive:  Appropriate  Insight:  Appropriate  Engagement in Group:  Engaged  Modes of Intervention:  Education  Summary of Progress/Problems:  Patient actively participated in educational group.    Earline MayotteKnight, Parag Dorton Shephard 08/15/2017, 5:11 PM
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
## 2017-08-15 NOTE — [facility] Group Notes (Signed)
Adult Psychoeducational Group Note  Date:  08/15/2017 Time:  9:54 PM  Group Topic/Focus:  Wrap-Up Group:   The focus of this group is to help patients review their daily goal of treatment and discuss progress on daily workbooks.  Participation Level:  Active  Participation Quality:  Appropriate and Attentive  Affect:  Appropriate  Cognitive:  Alert and Appropriate  Insight: Appropriate and Good  Engagement in Group:  Engaged  Modes of Intervention:  Discussion and Education  Additional Comments:  Pt attended and participated in wrap up group this evening. Pt rated their day a 7/10 due to them being a little groggy today. Pt goal was to attend groups and they were able to attend all the groups. A positive noted by the pt was that they attended groups and learned something new in groups (emotional regulation).   Pamela NettersOctavia A Kelden Graham 08/15/2017, 9:54 PM

## 2017-08-15 NOTE — Progress Notes (Signed)
Adult Psychoeducational Group Note  Date:  08/15/2017 Time:  9:58 AM  Group Topic/Focus:  Goals Group:   The focus of this group is to help patients establish daily goals to achieve during treatment and discuss how the patient can incorporate goal setting into their daily lives to aide in recovery.  Participation Level:  Minimal  Participation Quality:  Appropriate  Affect:  Appropriate  Cognitive:  Alert  Insight: Good  Engagement in Group:  Limited  Modes of Intervention:  Discussion and Education  Additional Comments: Pt was able to attend group this morning but did not share much only saying that " I'm doing okay".  Jasdeep Dejarnett E 08/15/2017, 9:58 AM

## 2017-08-15 NOTE — Tx Team (Signed)
Interdisciplinary Treatment and Diagnostic Plan Update  08/15/2017 Time of Session: 0935 Pamela Graham MRN: [id number]  Principal Diagnosis: Bipolar Disorder, Depressed   Secondary Diagnoses: Principal Problem:   Major depressive disorder, recurrent severe without psychotic features (HCC)   Current Medications:  Current Facility-Administered Medications  Medication Dose Route Frequency Provider Last Rate Last Dose  . acetaminophen (TYLENOL) tablet 650 mg  650 mg Oral Q4H PRN Charm RingsLord, Jamison Y, NP   650 mg at 08/14/17 2301  . alum & mag hydroxide-simeth (MAALOX/MYLANTA) 200-200-20 MG/5ML suspension 30 mL  30 mL Oral Q6H PRN Charm RingsLord, Jamison Y, NP      . ARIPiprazole (ABILIFY) tablet 10 mg  10 mg Oral Daily Cobos, Rockey SituFernando A, MD   10 mg at 08/15/17 0808  . DULoxetine (CYMBALTA) DR capsule 90 mg  90 mg Oral Daily Money, Gerlene Burdockravis B, FNP   90 mg at 08/15/17 [id number]  . hydrOXYzine (ATARAX/VISTARIL) tablet 25 mg  25 mg Oral TID PRN Nira ConnBerry, Jason A, NP   25 mg at 08/14/17 2301  . magnesium hydroxide (MILK OF MAGNESIA) suspension 30 mL  30 mL Oral Daily PRN Charm RingsLord, Jamison Y, NP      . naproxen (NAPROSYN) tablet 500 mg  500 mg Oral BID WC Izediuno, Delight OvensVincent A, MD   500 mg at 08/15/17 0807  . nitrofurantoin (macrocrystal-monohydrate) (MACROBID) capsule 100 mg  100 mg Oral Q12H Donell SievertSimon, Spencer E, PA-C   100 mg at 08/15/17 [id number]  . ondansetron (ZOFRAN) tablet 4 mg  4 mg Oral Q8H PRN Charm RingsLord, Jamison Y, NP      . traZODone (DESYREL) tablet 100 mg  100 mg Oral QHS,MR X 1 Kerry HoughSimon, Spencer E, PA-C   100 mg at 08/14/17 2302   PTA Medications: Medications Prior to Admission  Medication Sig Dispense Refill Last Dose  . HYDROcodone-acetaminophen (NORCO) 5-325 MG per tablet Take 1 tablet by mouth every 6 (six) hours as needed (for pain; may cause constipation). (Patient not taking: Reported on 08/07/2017) 10 tablet 0 Not Taking at Unknown time    Patient Stressors: Health problems Marital or family conflict  Patient Strengths:  Wellsite geologistCommunication skills General fund of knowledge Religious Affiliation  Treatment Modalities: Medication Management, Group therapy, Case management,  1 to 1 session with clinician, Psychoeducation, Recreational therapy.   Physician Treatment Plan for Primary Diagnosis: Major depressive disorder, recurrent severe without psychotic features (HCC) Long Term Goal(s): Improvement in symptoms so as ready for discharge Improvement in symptoms so as ready for discharge   Short Term Goals: Ability to identify changes in lifestyle to reduce recurrence of condition will improve Ability to maintain clinical measurements within normal limits will improve Ability to identify changes in lifestyle to reduce recurrence of condition will improve Ability to verbalize feelings will improve Ability to disclose and discuss suicidal ideas Ability to demonstrate self-control will improve Ability to identify and develop effective coping behaviors will improve Ability to maintain clinical measurements within normal limits will improve  Medication Management: Evaluate patient's response, side effects, and tolerance of medication regimen.  Therapeutic Interventions: 1 to 1 sessions, Unit Group sessions and Medication administration.  Evaluation of Outcomes: Progressing  Physician Treatment Plan for Secondary Diagnosis: Principal Problem:   Major depressive disorder, recurrent severe without psychotic features (HCC)  Long Term Goal(s): Improvement in symptoms so as ready for discharge Improvement in symptoms so as ready for discharge   Short Term Goals: Ability to identify changes in lifestyle to reduce recurrence of condition will improve Ability to maintain clinical  measurements within normal limits will improve Ability to identify changes in lifestyle to reduce recurrence of condition will improve Ability to verbalize feelings will improve Ability to disclose and discuss suicidal ideas Ability to  demonstrate self-control will improve Ability to identify and develop effective coping behaviors will improve Ability to maintain clinical measurements within normal limits will improve     Medication Management: Evaluate patient's response, side effects, and tolerance of medication regimen.  Therapeutic Interventions: 1 to 1 sessions, Unit Group sessions and Medication administration.  Evaluation of Outcomes: Progressing     RN Treatment Plan for Primary Diagnosis: Bipolar Disorder, Depressed   Long Term Goal(s): Knowledge of disease and therapeutic regimen to maintain health will improve  Short Term Goals: Ability to remain free from injury will improve, Ability to verbalize frustration and anger appropriately will improve, Ability to demonstrate self-control, Ability to participate in decision making will improve, Ability to verbalize feelings will improve, Ability to disclose and discuss suicidal ideas, Ability to identify and develop effective coping behaviors will improve and Compliance with prescribed medications will improve  Medication Management: RN will administer medications as ordered by provider, will assess and evaluate patient's response and provide education to patient for prescribed medication. RN will report any adverse and/or side effects to prescribing provider.  Therapeutic Interventions: 1 on 1 counseling sessions, Psychoeducation, Medication administration, Evaluate responses to treatment, Monitor vital signs and CBGs as ordered, Perform/monitor CIWA, COWS, AIMS and Fall Risk screenings as ordered, Perform wound care treatments as ordered.  Evaluation of Outcomes: Progressing   LCSW Treatment Plan for Primary Diagnosis:Bipolar Disorder, Depressed   Long Term Goal(s): Safe transition to appropriate next level of care at discharge, Engage patient in therapeutic group addressing interpersonal concerns.  Short Term Goals: Engage patient in aftercare planning with  referrals and resources, Increase social support, Increase ability to appropriately verbalize feelings, Increase emotional regulation, Facilitate acceptance of mental health diagnosis and concerns, Facilitate patient progression through stages of change regarding substance use diagnoses and concerns, Identify triggers associated with mental health/substance abuse issues and Increase skills for wellness and recovery  Therapeutic Interventions: Assess for all discharge needs, 1 to 1 time with Social worker, Explore available resources and support systems, Assess for adequacy in community support network, Educate family and significant other(s) on suicide prevention, Complete Psychosocial Assessment, Interpersonal group therapy.  Evaluation of Outcomes: Progressing   Progress in Treatment: Attending groups: Yes. Participating in groups: Yes. Taking medication as prescribed: Yes. Toleration medication: Yes. Family/Significant other contact made: Yes, individual(s) contacted:  daughter Patient understands diagnosis: Yes. Discussing patient identified problems/goals with staff: Yes. Medical problems stabilized or resolved: Yes. Denies suicidal/homicidal ideation: Yes. Issues/concerns per patient self-inventory: Yes. Other:   New problem(s) identified: None  New Short Term/Long Term Goal(s):  medication stabilization, elimination of SI thoughts, development of comprehensive mental wellness plan.   Patient Goal: ???  Discharge Plan or Barriers: Return home with her daughter and follow up with [facility].   Reason for Continuation of Hospitalization: Anxiety Depression Medication stabilization  Estimated Length of Stay: 1-3 days.  Attendees: Patient: 08/15/2017   Physician: Dr Jackquline Berlin, MD 08/15/2017   Nursing: [name], RN 08/15/2017   RN Care Manager: 08/15/2017   Social Worker: Daleen Squibb, LCSW 08/15/2017   Recreational Therapist:  08/15/2017   Other:  08/15/2017   Other:  08/15/2017    Other: 08/15/2017      Scribe for Treatment Team: Lorri Frederick, LCSW 08/15/2017 11:17 AM

## 2017-08-16 NOTE — Progress Notes (Signed)
Adult Psychoeducational Group Note  Date:  08/16/2017 Time:  9:06 PM  Group Topic/Focus:  Wrap-Up Group:   The focus of this group is to help patients review their daily goal of treatment and discuss progress on daily workbooks.  Participation Level:  Active  Participation Quality:  Appropriate  Affect:  Appropriate  Cognitive:  Alert and Oriented  Insight: Good  Engagement in Group:  Engaged  Modes of Intervention:  Discussion  Additional Comments:  Pt rated her day a 7/10 and goal for the day was to be positive, attend groups, and to work on her anxiety and depression. Pt stated that she did do that and that tomorrow she plans on getting ready for discharge. She states she also plans on using everything she has learned while being there.   Pamela GrosserMegan A Idris Edmundson 08/16/2017, 9:06 PM

## 2017-08-16 NOTE — [facility] Group Notes (Signed)
Orientation / Goals   Date:  08/16/2017  Time:  9:44 AM  Type of Therapy:  Nurse Education  /  The group is focuised on teaching patients who who their staff is, what the staff's responsibilities are and what the unit programming / scheduling looks like   Participation Level:  Active  Participation Quality:  Attentive  Affect:  Appropriate  Cognitive:  Alert  Insight:  Appropriate  Engagement in Group:  Engaged  Modes of Intervention:  Education  Summary of Progress/Problems:  Pamela Graham, Pamela Graham Lynn 08/16/2017, 9:44 AM
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
## 2017-08-16 NOTE — Progress Notes (Signed)
D. Pt presents with a flat affect and depressed behavior, but brightens somewhat during interactions. Pt observed interacting with peers in dayroom this am. Pt reports intermittently hearing a humming sound in her right ear. Pt reports that it came and went during the night, and heard it on and off during group. Pt denies ear pain and dizziness. Pt currently denies SI/HI and AVH. A. Labs and vitals monitored. Pt compliant with medications. Pt supported emotionally and encouraged to express concerns and ask questions.   R. Pt remains safe with 15 minute checks. Will continue POC.
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
## 2017-08-17 NOTE — Progress Notes (Addendum)
D Patient's mood and behavior is strinkingly different from what it has been during this hospital stay, as evidenced by her ( currently) talkative manner...she has been interacting with her peers over the past 3 hours, animatedly talking, planning and laughing, planning a not-for-profit gathering , for she and fellow patients. A She completed her daily assessment today and on this she wrote she deneid SI today and she rated her depression, hopelessness and anxiety , " 6/6/6/", respectively. R Pt is planning / expecting to be discharged to her home tomorrow.

## 2017-08-17 NOTE — Progress Notes (Signed)
Patient ID: Pamela RuaKim Graham, female   DOB: 12/05/1955, 62 y.o.   MRN: [id number] D: Patient observed watching TV and interacting well with peers. Pt reports she feels well and had her best day today. Pt talked about upcoming court date and finding a good home to go to. Pt reports she is not sure daughter will allow her back to her home.  Pt attended evening wrap up group and engaged in discussions. Denies  SI/HI/AVH and pain.No behavioral issues noted.  A: Support and encouragement offered as needed. Medications administered as prescribed.  R: Patient is safe and cooperative on unit. Will continue to monitor  for safety and stability.

## 2017-08-17 NOTE — [facility] Group Notes (Signed)
Orientation / Goals Group  Date:  08/17/2017  Time:  9:33 AM  Type of Therapy:  Nurse Education  /  The group is focused on teaching patients who their staff is, what staff's responsibilities are and what unit programming / scheduling looks like.  Participation Level:  Active  Participation Quality:  Attentive  Affect:  Appropriate  Cognitive:  Appropriate  Insight:  Good  Engagement in Group:  Engaged  Modes of Intervention:  Education  Summary of Progress/Problems:  Pamela Graham, Pamela Graham 08/17/2017, 9:33 AM
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
## 2017-08-17 NOTE — [facility] Group Notes (Signed)
Identifying Needs   Date:  08/17/2017  Time:  7:45 AM  Type of Therapy:  Nurse Education  :  The group is focused on educating patients how to identify their needs and then ways to communicate these needs- in order to get them met.   Participation Level:  Active  Participation Quality:  Attentive  Affect:  Appropriate  Cognitive:  Appropriate  Insight:  Appropriate  Engagement in Group:  Engaged  Modes of Intervention:  Education  Summary of Progress/Problems:  Pamela Graham 08/17/2017, 7:45 AM

## 2017-08-17 NOTE — [facility] Group Notes (Signed)
[facility]  Date/Time:  08/17/2017 10:00-11:00AM  Type of Therapy and Topic:  Group Therapy:  Healthy and Unhealthy Supports  Participation Level:  Active   Description of Group:  Patients in this group were introduced to the idea of adding a variety of healthy supports to address the various needs in their lives.Patients discussed what additional healthy supports could be helpful in their recovery and wellness after discharge in order to prevent future hospitalizations.   An emphasis was placed on using counselor, doctor, therapy groups, 12-step groups, and problem-specific support groups to expand supports.  They also worked as a group on developing a specific plan for several patients to deal with unhealthy supports through boundary-setting, psychoeducation with loved ones, and even termination of relationships.   Therapeutic Goals:   1)  discuss importance of adding supports to stay well once out of the hospital  2)  compare healthy versus unhealthy supports and identify some examples of each  3)  generate ideas and descriptions of healthy supports that can be added  4)  offer mutual support about how to address unhealthy supports  5)  encourage active participation in and adherence to discharge plan    Summary of Patient Progress:  The patient expressed a willingness to add a reapplication for her disability check and Medicare, as well as cell phone and linking her family to [employer] to help in her recovery journey.   Therapeutic Modalities:   Motivational Interviewing Brief Solution-Focused Therapy  Ambrose MantleMareida Grossman-Orr, LCSW

## 2017-08-18 MED ORDER — HYDROXYZINE HCL 25 MG PO TABS: 25.0000 mg | ORAL_TABLET | Freq: Three times a day (TID) | ORAL | 0 refills | Status: DC | PRN

## 2017-08-18 MED ORDER — TRAZODONE HCL 100 MG PO TABS: 100.0000 mg | ORAL_TABLET | Freq: Every evening | ORAL | 0 refills | Status: DC | PRN

## 2017-08-18 MED ORDER — ARIPIPRAZOLE 10 MG PO TABS: 10.0000 mg | ORAL_TABLET | Freq: Every day | ORAL | 0 refills | Status: DC

## 2017-08-18 MED ORDER — DULOXETINE HCL 30 MG PO CPEP: 90.0000 mg | ORAL_CAPSULE | Freq: Every day | ORAL | 0 refills | Status: DC

## 2017-08-18 MED ORDER — HYDROCHLOROTHIAZIDE 12.5 MG PO CAPS
12.5000 mg | ORAL_CAPSULE | Freq: Every day | ORAL | Status: DC
  Administered 2017-08-18 – 2017-08-21 (×4): 12.5 mg via ORAL
  Filled 2017-08-18 (×6): qty 1

## 2017-08-18 NOTE — Progress Notes (Signed)
Patient ID: Pamela Graham, female   DOB: 02/22/1956, 62 y.o.   MRN: [id number]  Pt up stating she is in pain and unable to sleep. Pt asked for a warm pack and second dose of trazodone administered. Pt refuse to go to her room and lie down. Encourage  rest and hopefully fall asleep. Pt standing in hallway refusing to go to bed.

## 2017-08-18 NOTE — Progress Notes (Addendum)
CSW met with pt to discuss housing options since her daughter is saying she cannot return home.  Pt reports she has a friend who works at a motel who can get her a room starting Thursday, 3/7, but has no other options at this time.  CSW asked about any other family.  Pt reports she has a sister in [place] who has schizophrenia and an alcoholic husband.  She also has a siter in [place] who may be more of an option if pt had transportation to get there.  Pt reports she spoke to social security representative this AM who told her there are two forms she needs to download and have the MD fill out and then they can restart her disability payments within 3-5 days.  (daughter also said this after daughter said she brought letter from MD to social security last week)  CSW and pt tried to find the forms online--one was an ROI and CSW printed it off.  The second form ([place]) was not able to be printed online--message said this form needed to be filled out by a [employer] representative.  We attempted to call [employer]SA but the wait time was 50 minutes.  CSW helped pt get on the pt phone to recontact [employer] about the form 454.  Meanwhile, pt asked CSW what her daughter said to CSW on the phone and that she is going to "get her" and that "she needs to be gotten."   CSW then spoke to Dr Altamese Cleburne regarding the entire situation.  He is in favor of keeping her while a new plan can be looked into, especially with statements pt is making regarding being angry at her daughter.  CSW informed him of possibly looking into helping pt get to [place] to another relative home. Winferd Humphrey, MSW, LCSW Clinical Social Worker 08/18/2017 11:30 AM   CSW spoke again to pt, who did talk to [facility] office and has appt for this Friday.  Pt does not want to move out of state, wants to remain in [place].  Pt now stating she has a younger daughter who she is trying to contact about staying with her for a few days until she can enter the motel.  Pt aware she may  be discharged tomorrow. Winferd Humphrey, MSW, LCSW Clinical Social Worker 08/18/2017 4:10 PM

## 2017-08-18 NOTE — [facility] Group Notes (Signed)
[facility] Group Notes:  (Nursing/MHT/Case Management/Adjunct)  Date:  08/18/2017  Time:  1030  Type of Therapy:  Nurse Education - Self Care  Participation Level:  None  Participation Quality:  Inattentive  Affect:  Irritable and Labile  Cognitive:  Alert  Insight:  None  Engagement in Group:  Distracting  Modes of Intervention:  Education, Limit-setting and Support  Summary of Progress/Problems: Patient walked in and out of group. Labile. Content of what she shared was inappropriate stating she wanted to kill her daughter.  Merian CapronFriedman, Lauraann Missey [facility] 08/18/2017, 11:14 AM

## 2017-08-18 NOTE — Progress Notes (Signed)
Adult Psychoeducational Group Note  Date:  08/18/2017 Time:  8:51 PM  Group Topic/Focus:  Wrap-Up Group:   The focus of this group is to help patients review their daily goal of treatment and discuss progress on daily workbooks.  Participation Level:  Active  Participation Quality:  Appropriate  Affect:  Appropriate  Cognitive:  Appropriate  Insight: Appropriate  Engagement in Group:  Engaged  Modes of Intervention:  Discussion  Additional Comments:  The patient expressed that she rates today a 5.The patient also said that she attended all groups.  Octavio Mannshigpen, Reneka Nebergall Lee 08/18/2017, 8:51 PM
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
## 2017-08-18 NOTE — BHH Group Notes (Signed)
LCSW Group Therapy Note 08/18/2017 3:29 PM  Type of Therapy and Topic: Group Therapy: Overcoming Obstacles  Participation Level: Active  Description of Group:  In this group patients will be encouraged to explore what they see as obstacles to their own wellness and recovery. They will be guided to discuss their thoughts, feelings, and behaviors related to these obstacles. The group will process together ways to cope with barriers, with attention given to specific choices patients can make. Each patient will be challenged to identify changes they are motivated to make in order to overcome their obstacles. This group will be process-oriented, with patients participating in exploration of their own experiences as well as giving and receiving support and challenge from other group members.  Therapeutic Goals: 1. Patient will identify personal and current obstacles as they relate to admission. 2. Patient will identify barriers that currently interfere with their wellness or overcoming obstacles.  3. Patient will identify feelings, thought process and behaviors related to these barriers. 4. Patient will identify two changes they are willing to make to overcome these obstacles:   Summary of Patient Progress  Selena BattenKim was engaged throughout and participated in today's group session. She contributed to the group's discussion and stated that her main obstacle was that she could not return to her daughter's home at discharge and now she has no where to go. Selena BattenKim was not able to share what her plans were to overcome this obstacle due to being excused by the doctor.    Therapeutic Modalities:  Cognitive Behavioral Therapy Solution Focused Therapy Motivational Interviewing Relapse Prevention Therapy   Alcario DroughtJolan Azar South LCSWA Clinical Social Worker

## 2017-08-18 NOTE — Progress Notes (Signed)
Patient ID: Pamela Graham, female   DOB: 06/27/1955, 62 y.o.   MRN: [id number]  Pt currently presents with a labile affect and hyperactive, restless behavior. 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 Pt preoccupied with high blood pressure and reports ongoing feeling of her heart. Requests to have her blood pressure and pulse monitored multiple times tonight but MHT and RN.  Pt states "I may be going to [place] to live with my grandaughter when I leave, she will be working as a Engineer, civil (consulting)nurse there." Pt then begins to talk about her daughter, whom she calls a "b.i.t.c.h. And a w.h.o.r.e." Pt reports good sleep with current medication regimen, requests to take one dose of Trazodone tonight after her shower.   Pt provided with medications per providers orders. Pt's labs and vitals were monitored throughout the night. Pt given a 1:1 about emotional and mental status. Pt supported and encouraged to express concerns and questions. Visual assessments completed of patient throughout the night, seen laughing and excitedly talking on the phone a to peers. Pt educated on medications, effects and hypertension.  Pt's safety ensured with 15 minute and environmental checks. Pt currently denies SI/HI and A/V hallucinations. Pt verbally agrees to seek staff if SI/HI or A/VH occurs and to consult with staff before acting on any harmful thoughts. Pt currently tachycardic. Will reassess patients blood pressure and pulse in the morning. Will continue POC.

## 2017-08-18 NOTE — Progress Notes (Signed)
Patient ID: Pamela RuaKim Molenda, female   DOB: 10/02/1955, 62 y.o.   MRN: [id number] PER STATE REGULATIONS 482.30  THIS CHART WAS REVIEWED FOR MEDICAL NECESSITY WITH RESPECT TO THE PATIENT'S ADMISSION/ DURATION OF STAY.  NEXT REVIEW DATE: 08/20/2017  Willa RoughJENNIFER JONES Tandre Conly, RN, BSN CASE MANAGER

## 2017-08-18 NOTE — Progress Notes (Signed)
Patient ID: Pamela Graham, female   DOB: 06/13/1956, 62 y.o.   MRN: [id number]  DAR: Pt. denies SI/HI and A/V Hallucinations. She reports that her sleep last night was poor, her appetite is good, her energy level is high, and her concentration is good. She rates her depression and anxiety levels 6/10. She rates her hopelessness level 4/10. Patient reports pain that is generalized due to her "fibromyalgia." Support and encouragement provided to the patient. Scheduled medications administered to patient per physician's orders. Patient is seen in the milieu, this morning she presented with animated affect. She states, "it's a great day." As the day progresses she is intermittently irritable and agitated. She is labile at this time. Q15 minute checks are maintained for safety.

## 2017-08-18 NOTE — Progress Notes (Signed)
Recreation Therapy Notes  Date: 08/18/17 Time: 0930 Location: [address] Dayroom  Group Topic: Stress Management  Goal Area(s) Addresses:  Patient will verbalize importance of using healthy stress management.  Patient will identify positive emotions associated with healthy stress management.   Intervention: Stress Management  Activity :  Guided Imagery.  LRT introduced the stress management technique of guided imagery.  LRT read a script that allowed patients to envision being on the beach.  Patients were to follow along as the script was read to engage in the activity.  Education: Stress Management, Discharge Planning.   Education Outcome: Acknowledges edcuation/In group clarification offered/Needs additional education  Clinical Observations/Feedback: Pt did not attend group.     Adelyn Roscher, LRT/CTRS          Handsome Anglin A 08/18/2017 12:05 PM 

## 2017-08-18 NOTE — Progress Notes (Signed)
CSW spoke with pt daughter, Pamela Graham, who now reports she cannot allow pt to come back to her home "because she is manic and violent."  Pamela Graham said she spoke to pt on the phone and pt was argumentative.  Pamela Graham asked CSW to refer her to a shelter. [name], MSW, LCSW Clinical Social Worker 08/18/2017 10:17 AM

## 2017-08-19 MED ORDER — LORAZEPAM 1 MG PO TABS
1.0000 mg | ORAL_TABLET | Freq: Once | ORAL | Status: AC
  Administered 2017-08-19: 1 mg via ORAL

## 2017-08-19 MED ORDER — LORAZEPAM 1 MG PO TABS
ORAL_TABLET | ORAL | Status: AC
  Filled 2017-08-19: qty 1

## 2017-08-19 MED ORDER — ARIPIPRAZOLE 15 MG PO TABS
15.0000 mg | ORAL_TABLET | Freq: Every day | ORAL | Status: DC
  Administered 2017-08-19 – 2017-08-20 (×2): 15 mg via ORAL
  Filled 2017-08-19 (×5): qty 1

## 2017-08-19 MED ORDER — DULOXETINE HCL 60 MG PO CPEP
60.0000 mg | ORAL_CAPSULE | Freq: Every day | ORAL | Status: DC
  Administered 2017-08-19 – 2017-08-20 (×2): 60 mg via ORAL
  Filled 2017-08-19 (×3): qty 1

## 2017-08-19 MED ORDER — QUETIAPINE FUMARATE 100 MG PO TABS
ORAL_TABLET | ORAL | Status: AC
  Filled 2017-08-19: qty 1

## 2017-08-19 MED ORDER — TRAZODONE HCL 50 MG PO TABS
50.0000 mg | ORAL_TABLET | Freq: Every evening | ORAL | Status: DC | PRN
  Administered 2017-08-19 – 2017-08-21 (×2): 50 mg via ORAL
  Filled 2017-08-19 (×2): qty 1

## 2017-08-19 MED ORDER — VITAMIN D3 25 MCG (1000 UNIT) PO TABS
1000.0000 [IU] | ORAL_TABLET | Freq: Every day | ORAL | Status: DC
  Administered 2017-08-19 – 2017-08-22 (×4): 1000 [IU] via ORAL
  Filled 2017-08-19 (×6): qty 1

## 2017-08-19 MED ORDER — QUETIAPINE FUMARATE 100 MG PO TABS
100.0000 mg | ORAL_TABLET | Freq: Every day | ORAL | Status: DC
  Administered 2017-08-19 – 2017-08-20 (×2): 100 mg via ORAL
  Filled 2017-08-19 (×4): qty 1

## 2017-08-19 NOTE — Progress Notes (Signed)
[facility] MD Progress Note  08/19/2017 12:47 PM Pamela Graham  MRN:  [id number] Subjective:  Patient reports that her disposition /discharge planning was complicated by finding out that her daughter does not want her to return home after discharge. She remains future oriented and focused on disposition planning issues , and states she has other family in [place] with whom she might be able to move in .  Reports " I think I have had a 180 degree change ", and describes significant difference compared to admission presentation , which consisted of severe depression. States she is feeling " better", and admits that she feels her affect has been elevated. Denies medication side effects. Denies suicidal ideations.  Objective : I have discussed case with treatment team and have met with patient . As reviewed with staff, patient has been noted to be more hyperactive, more sociable , and at times has presented with pressured speech. 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 Of note, patient presents insightful ( see above) and expresses realization that her mood , affect have improved compared to admission but that she now feels more expansive in affect . She is focused on disposition planning options and as above,reports plan of returning to daughter's is not currently possible, but states she is thinking of relocating out of state to [place] to live with another family member . Today presents alert, attentive, pleasant, not overtly expansive or irritable, but clearly more communicative and verbal compared to her initial admission presentation. Denies SI. Denies psychotic symptoms, does not appear internally preoccupied .  Denies medication side effects. Patient 's BP has been elevated. States she does have a history of HTN and that it tends to elevate when stressed. Has been started on HCTZ which she is tolerating well thus far .    Principal Problem: MDD Diagnosis:   Patient Active  Problem List   Diagnosis Date Noted  . Major depressive disorder, recurrent severe without psychotic features ([place]) [F33.2] 08/08/2017   Total Time spent with patient: 20 minutes  Past Psychiatric History: As in H&P  Past Medical History:  Past Medical History:  Diagnosis Date  . Barrett's esophagus   . Fibromyalgia   . Gastric ulcer   . Hypertension   . Migraines     Past Surgical History:  Procedure Laterality Date  . ABDOMINAL HYSTERECTOMY    . COLONOSCOPY     Family History: History reviewed. No pertinent family history. Family Psychiatric  History: As in H&P Social History:  Social History   Substance and Sexual Activity  Alcohol Use Yes     Social History   Substance and Sexual Activity  Drug Use No    Social History   Socioeconomic History  . Marital status: Single    Spouse name: None  . Number of children: None  . Years of education: None  . Highest education level: None  Social Needs  . Financial resource strain: None  . Food insecurity - worry: None  . Food insecurity - inability: None  . Transportation needs - medical: None  . Transportation needs - non-medical: None  Occupational History  . None  Tobacco Use  . Smoking status: Former Research scientist (life sciences)  . Smokeless tobacco: Never Used  Substance and Sexual Activity  . Alcohol use: Yes  . Drug use: No  . Sexual activity: Not Currently  Other Topics Concern  . None  Social History Narrative  . None   Additional Social History:      .  Sleep: fair   Appetite:  Good  Current Medications: Current Facility-Administered Medications  Medication Dose Route Frequency Provider Last Rate Last Dose  . acetaminophen (TYLENOL) tablet 650 mg  650 mg Oral Q4H PRN Patrecia Pour, NP   650 mg at 08/18/17 2059  . alum & mag hydroxide-simeth (MAALOX/MYLANTA) 200-200-20 MG/5ML suspension 30 mL  30 mL Oral Q6H PRN Patrecia Pour, NP      . ARIPiprazole (ABILIFY) tablet 15 mg  15 mg Oral Daily Ravneet Spilker, Myer Peer,  MD   15 mg at 08/19/17 0803  . cholecalciferol (VITAMIN D) tablet 1,000 Units  1,000 Units Oral Daily Tyress Loden, Myer Peer, MD   1,000 Units at 08/19/17 0803  . DULoxetine (CYMBALTA) DR capsule 60 mg  60 mg Oral Daily Riaan Toledo, Myer Peer, MD   60 mg at 08/19/17 0802  . hydrochlorothiazide (MICROZIDE) capsule 12.5 mg  12.5 mg Oral Daily Money, Lowry Ram, FNP   12.5 mg at 08/19/17 9147  . hydrOXYzine (ATARAX/VISTARIL) tablet 25 mg  25 mg Oral TID PRN Lindon Romp A, NP   25 mg at 08/18/17 2059  . magnesium hydroxide (MILK OF MAGNESIA) suspension 30 mL  30 mL Oral Daily PRN Patrecia Pour, NP      . naproxen (NAPROSYN) tablet 500 mg  500 mg Oral BID WC Izediuno, Laruth Bouchard, MD   500 mg at 08/19/17 0803  . ondansetron (ZOFRAN) tablet 4 mg  4 mg Oral Q8H PRN Patrecia Pour, NP      . traZODone (DESYREL) tablet 100 mg  100 mg Oral QHS,MR X 1 Laverle Hobby, PA-C   100 mg at 08/18/17 2235    Lab Results:  No results found for this or any previous visit (from the past 48 hour(s)).  Blood Alcohol level:  Lab Results  Component Value Date   ETH <10 08/07/2017   ETH  03/14/2010    <10        LOWEST DETECTABLE LIMIT FOR SERUM ALCOHOL IS 5 mg/dL FOR MEDICAL PURPOSES ONLY    Metabolic Disorder Labs: Lab Results  Component Value Date   HGBA1C 5.5 08/10/2017   MPG 111 08/10/2017   No results found for: PROLACTIN Lab Results  Component Value Date   CHOL 135 08/10/2017   TRIG 21 08/10/2017   HDL 64 08/10/2017   CHOLHDL 2.1 08/10/2017   VLDL 4 08/10/2017   LDLCALC 67 08/10/2017    Physical Findings: AIMS: Facial and Oral Movements Muscles of Facial Expression: None, normal Lips and Perioral Area: None, normal Jaw: None, normal Tongue: None, normal,Extremity Movements Upper (arms, wrists, hands, fingers): None, normal Lower (legs, knees, ankles, toes): None, normal, Trunk Movements Neck, shoulders, hips: None, normal, Overall Severity Severity of abnormal movements (highest score from  questions above): None, normal Incapacitation due to abnormal movements: None, normal Patient's awareness of abnormal movements (rate only patient's report): No Awareness, Dental Status Current problems with teeth and/or dentures?: No Does patient usually wear dentures?: No  CIWA:  CIWA-Ar Total: 1 COWS:  COWS Total Score: 2  Musculoskeletal: Strength & Muscle Tone: within normal limits Gait & Station: normal Patient leans: N/A  Psychiatric Specialty Exam: Physical Exam  Constitutional: She is oriented to person, place, and time. She appears well-developed and well-nourished.  HENT:  Head: Normocephalic and atraumatic.  Respiratory: Effort normal.  Neurological: She is alert and oriented to person, place, and time.  Psychiatric:  As above     ROS denies headache, denies chest pain,  denies shortness of breath  Blood pressure 140/78, pulse 88, temperature 98.7 F (37.1 C), temperature source Oral, resp. rate 18, height _0  (1.676 m), weight 65.5 kg (144 lb 8 oz), SpO2 97 %.Body mass index is 23.32 kg/m.  General Appearance:well groomed   Eye Contact:  Good  Speech:  Normal, not currently pressured   Volume:  Normal  Mood:  Reports her mood is "good "  Affect:  Full range, not overtly irritable or overly expansive today  Thought Process:  Linear- no flight of ideations noted today  Orientation:  Full (Time, Place, and Person)  Thought Content: denies hallucinations, no delusions are expressed, not internally preoccupied . Currently does mention having started a non profit organization recently but mentions in passing and does not appear focused on this. No  grandiose ideations noted   Suicidal Thoughts:  No denies suicidal or self injurious ideations, denies homicidal or violent ideations, and specifically also denies HI towards daughter   Homicidal Thoughts:  No  Memory:  recent and remote grossly intact   Judgement:  Fair- improving   Insight:  Fair- improving   Psychomotor  Activity:  Good- no psychomotor restlessness or agitation at this time   Concentration:  Good  Recall:  Good  Fund of Knowledge:  Good  Language:  Good  Akathisia:  Negative  Handed:    AIMS (if indicated):     Assets:  Communication Skills Desire for Improvement Financial Resources/Insurance Housing Physical Health Resilience  ADL's:  Better  Cognition:  WNL  Sleep:  Number of Hours: 4.75    Assessment - patient presents with significant improvement compared to admission. She reports improved symptoms of depression, and currently presents future oriented, with full range of affect, denies any SI. She does report she feels her affect has been elevated and that she has felt " a little manic", but at this time no overt manic symptoms noted, although chart notes, staff do report episodes of hyperactive behavior and racing thoughts .states she had planned to discharge back home, but that her daughter has stated she cannot return there, so is working on other disposition options. Based on above presentation, mood change, will taper antidepressant dose ( Cymbalta, which she also takes for fibromyalgia) and will titrate Abilify further for mood disorder .     PLAN: Encourage group and milieu participation to work on Radiographer, therapeutic and symptom reduction Treatment team working on disposition planning - see above . Increase Abilify to 15 mgrs QDAY for mood disorder  Decrease Cymbalta to 60 mgrs QDAY for mood disorder, depression, fibromyalgia Start Vitamin D supplementation for Vitamin D deficiency  Continue HCTZ 12.5 mgrs QDAY for HTN Decrease Trazodone to 50 mgrs QHS PRN for insomnia as needed     Jenne Campus, MD 08/19/2017, 12:47 PM   Patient ID: Dayna Barker, female   DOB: 22-Sep-1955, 61 y.o.   MRN: [id number]

## 2017-08-19 NOTE — [facility] Group Notes (Signed)
[facility] Group Notes:  (Nursing/MHT/Case Management/Adjunct)  Date:  08/19/2017  Time:  1515  Type of Therapy:  Nurse Education - Therapeutic Activity  Participation Level:  Active  Participation Quality:  Distracting  Affect:  Animated  Cognitive:  Oriented  Insight:  Limited  Engagement in Group:  Distracting  Modes of Intervention:  Activity  Summary of Progress/Problems: Patient participated in therapy ball activity. Shared with group. Pleasant mood and verbalized enjoyment of group.    Merian CapronFriedman, Hermann Dottavio [facility] 08/19/2017, 4:38 PM

## 2017-08-19 NOTE — Progress Notes (Signed)
Pt was walking in hallway upon initial approach.  She frequently comes to the nurse's station with requests and demands.  Her speech is loud and pressured.  She is intrusive with staff and peers.  Presents with labile affect and mood.  Pt is pleasant at times and then agitated at other times.  When writer was reviewing PRN sleep medication with pt, pt stopped Clinical research associatewriter and began asking why her Trazodone was decreased "from 200 to 50."  Pt was argumentative, agitated, and escalating at the time.  She informed Clinical research associatewriter that she would not sleep and "I'll just stay up all night."  Writer informed on-site provider and Ativan 1 mg POX1 and Seroquel 100 mg PO QHS was ordered and administered.  Pt states "thank you" after medication administration and she reports she has taken these medications before with positive effects.  Pt has been hyperverbal, tangential and delusional tonight.  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  She reports her day was "great" and her goal was to "work on my depression and anxiety in group and therapy and concentrate more on my nonprofit, [employer]."  Pt denies SI/HI and hallucinations.  She requires frequent redirection.  Introduced self to pt.  Attended to pt's demands and requests.  Set boundaries with pt.  Medication administered per order.  Emotional support and encouragement offered.  PRN medication administered for sleep.  Q15 minute safety checks maintained.  Pt is compliant with medications.  She verbally contracts for safety.  Will continue to monitor and assess.

## 2017-08-19 NOTE — Progress Notes (Signed)
D: Patient observed hyperactive around the milieu. Drawing on the dry erase board in the dayroom. 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 Patient does state to this Clinical research associatewriter, "I know I'm a little manic. I feel like it might be some better this morning. I guess this makes me bipolar?  I am still going to start that nonprofit though."  Patient's affect animated, mood elevated, preoccupied. Speech pressured, rapid. Per self inventory and discussions with writer, rates depression at a 5/10, hopelessness at a 5/10 and anxiety at a 9/10. Rates sleep as good, appetite as good, energy as hyper and concentration as fair.  States goal for today is "being discharged, depression, anxiety, groups, medication, having a place to live, transportation." Complains of chronic generalized pain at a 7/10. No other physical complaints.   A: Medicated per orders, no prns requested or required. New orders received - cymbalta decreased, abilify increased and vitamin D initiated. Explained changes to patient. Level III obs in place for safety. Emotional support offered and self inventory reviewed. Encouraged completion of Suicide Safety Plan and programming participation. Will continue to set kind, enforceable limits and explain behavioral expectations while patient is here. Discussed POC with MD, SW.   R: Patient verbalizes understanding of POC. On reassess, patient reports pain at a 6/10. Patient denies SI/HI/AVH and remains safe on level III obs. Will continue to monitor closely and make verbal contact frequently.

## 2017-08-19 NOTE — [facility] Group Notes (Signed)
LCSW Group Therapy Note 08/19/2017 12:28 PM  Type of Therapy and Topic: Group Therapy: Avoiding Self-Sabotaging and Enabling Behaviors  Participation Level: Active  Description of Group:  In this group, patients will learn how to identify obstacles, self-sabotaging and enabling behaviors, as well as: what are they, why do we do them and what needs these behaviors meet. Discuss unhealthy relationships and how to have positive healthy boundaries with those that sabotage and enable. Explore aspects of self-sabotage and enabling in yourself and how to limit these self-destructive behaviors in everyday life.  Therapeutic Goals: 1. Patient will identify one obstacle that relates to self-sabotage and enabling behaviors 2. Patient will identify one personal self-sabotaging or enabling behavior they did prior to admission 3. Patient will state a plan to change the above identified behavior 4. Patient will demonstrate ability to communicate their needs through discussion and/or role play.   Summary of Patient Progress:  Pamela Graham was engaged and participated throughout the group session. Pamela Graham stated that she believes that to self sabotage, means that "you are setting yourself up for failure". Pamela Graham states that her personal self sabotaging behavior was not sticking to her diet. Pamela Graham states that when she eats certain foods and her blood pressure rises she typically becomes manic. Pamela Graham states that she plans to follow her diet and take her prescribed medications so that she can manage her new diagnosis of Bipolar disorder.    Therapeutic Modalities:  Cognitive Behavioral Therapy Person-Centered Therapy Motivational Interviewing   Pamela Graham LCSWA Clinical Social Worker

## 2017-08-19 NOTE — Plan of Care (Signed)
Patient has been compliant. Denies SI and verbalizes contract for safety should that change.

## 2017-08-20 DIAGNOSIS — E559 Vitamin D deficiency, unspecified: Secondary | ICD-10-CM

## 2017-08-20 DIAGNOSIS — I10 Essential (primary) hypertension: Secondary | ICD-10-CM

## 2017-08-20 DIAGNOSIS — M797 Fibromyalgia: Secondary | ICD-10-CM

## 2017-08-20 LAB — GLUCOSE, CAPILLARY: Glucose-Capillary: 138 mg/dL — ABNORMAL HIGH (ref 65–99)

## 2017-08-20 MED ORDER — QUETIAPINE FUMARATE 50 MG PO TABS
50.0000 mg | ORAL_TABLET | Freq: Two times a day (BID) | ORAL | Status: DC
  Administered 2017-08-20 – 2017-08-22 (×4): 50 mg via ORAL
  Filled 2017-08-20 (×8): qty 1

## 2017-08-20 MED ORDER — LORAZEPAM 0.5 MG PO TABS
0.5000 mg | ORAL_TABLET | Freq: Four times a day (QID) | ORAL | Status: DC | PRN
  Administered 2017-08-20: 0.5 mg via ORAL
  Filled 2017-08-20: qty 1

## 2017-08-20 MED ORDER — LORAZEPAM 1 MG PO TABS
1.0000 mg | ORAL_TABLET | Freq: Four times a day (QID) | ORAL | Status: DC | PRN
  Administered 2017-08-20 – 2017-08-21 (×3): 1 mg via ORAL
  Filled 2017-08-20 (×2): qty 1

## 2017-08-20 MED ORDER — DULOXETINE HCL 30 MG PO CPEP
30.0000 mg | ORAL_CAPSULE | Freq: Every day | ORAL | Status: DC
  Filled 2017-08-20 (×2): qty 1

## 2017-08-20 MED ORDER — LORAZEPAM 1 MG PO TABS
ORAL_TABLET | ORAL | Status: AC
  Filled 2017-08-20: qty 1

## 2017-08-20 MED ORDER — LORAZEPAM 2 MG/ML IJ SOLN: 2.0000 mg | Freq: Four times a day (QID) | INTRAMUSCULAR | Status: DC | PRN

## 2017-08-20 MED ORDER — ZIPRASIDONE MESYLATE 20 MG IM SOLR: 20.0000 mg | Freq: Four times a day (QID) | INTRAMUSCULAR | Status: DC | PRN

## 2017-08-20 NOTE — Progress Notes (Signed)
Pt mood is labile. Pt verbally aggressive and argumentative towards peers. Pt observed calling another pt derogatory names which made her cry. Pt was then observed by MHT on the hall, yelling and cursing at two other pts in the dayroom. Pt then got in to another pt's face and started yelling and the MHT on the hall had to intervene. Nicole KindredAgnes, NP., made aware. Writer and NP., spoke with pt in regards to inappropriate behaviors and discussed appropriate behaviors.

## 2017-08-20 NOTE — Tx Team (Signed)
Interdisciplinary Treatment and Diagnostic Plan Update  08/20/2017 Time of Session: 1130 Pamela Graham MRN: [id number]  Principal Diagnosis: Bipolar Disorder, Depressed   Secondary Diagnoses: Principal Problem:   Major depressive disorder, recurrent severe without psychotic features (HCC)   Current Medications:  Current Facility-Administered Medications  Medication Dose Route Frequency Provider Last Rate Last Dose  . acetaminophen (TYLENOL) tablet 650 mg  650 mg Oral Q4H PRN Charm Rings, NP   650 mg at 08/18/17 2059  . alum & mag hydroxide-simeth (MAALOX/MYLANTA) 200-200-20 MG/5ML suspension 30 mL  30 mL Oral Q6H PRN Charm Rings, NP      . ARIPiprazole (ABILIFY) tablet 15 mg  15 mg Oral Daily Cobos, Rockey Situ, MD   15 mg at 08/20/17 0803  . cholecalciferol (VITAMIN D) tablet 1,000 Units  1,000 Units Oral Daily Cobos, Rockey Situ, MD   1,000 Units at 08/20/17 0805  . [START ON 08/21/2017] DULoxetine (CYMBALTA) DR capsule 30 mg  30 mg Oral Daily Cobos, Fernando A, MD      . hydrochlorothiazide (MICROZIDE) capsule 12.5 mg  12.5 mg Oral Daily Money, Gerlene Burdock, FNP   12.5 mg at 08/20/17 4098  . magnesium hydroxide (MILK OF MAGNESIA) suspension 30 mL  30 mL Oral Daily PRN Charm Rings, NP      . naproxen (NAPROSYN) tablet 500 mg  500 mg Oral BID WC Izediuno, Delight Ovens, MD   500 mg at 08/20/17 0803  . ondansetron (ZOFRAN) tablet 4 mg  4 mg Oral Q8H PRN Charm Rings, NP      . QUEtiapine (SEROQUEL) tablet 100 mg  100 mg Oral QHS Donell Sievert E, PA-C   100 mg at 08/19/17 2249  . traZODone (DESYREL) tablet 50 mg  50 mg Oral QHS PRN Cobos, Rockey Situ, MD   50 mg at 08/19/17 2240   PTA Medications: Medications Prior to Admission  Medication Sig Dispense Refill Last Dose  . HYDROcodone-acetaminophen (NORCO) 5-325 MG per tablet Take 1 tablet by mouth every 6 (six) hours as needed (for pain; may cause constipation). (Patient not taking: Reported on 08/07/2017) 10 tablet 0 Not Taking at  Unknown time    Patient Stressors: Health problems Marital or family conflict  Patient Strengths: Wellsite geologist fund of knowledge Religious Affiliation  Treatment Modalities: Medication Management, Group therapy, Case management,  1 to 1 session with clinician, Psychoeducation, Recreational therapy.   Physician Treatment Plan for Primary Diagnosis: Major depressive disorder, recurrent severe without psychotic features (HCC) Long Term Goal(s): Improvement in symptoms so as ready for discharge Improvement in symptoms so as ready for discharge   Short Term Goals: Ability to identify changes in lifestyle to reduce recurrence of condition will improve Ability to maintain clinical measurements within normal limits will improve Ability to identify changes in lifestyle to reduce recurrence of condition will improve Ability to verbalize feelings will improve Ability to disclose and discuss suicidal ideas Ability to demonstrate self-control will improve Ability to identify and develop effective coping behaviors will improve Ability to maintain clinical measurements within normal limits will improve  Medication Management: Evaluate patient's response, side effects, and tolerance of medication regimen.  Therapeutic Interventions: 1 to 1 sessions, Unit Group sessions and Medication administration.  Evaluation of Outcomes: Progressing  Physician Treatment Plan for Secondary Diagnosis: Principal Problem:   Major depressive disorder, recurrent severe without psychotic features (HCC)  Long Term Goal(s): Improvement in symptoms so as ready for discharge Improvement in symptoms so as ready for discharge  Short Term Goals: Ability to identify changes in lifestyle to reduce recurrence of condition will improve Ability to maintain clinical measurements within normal limits will improve Ability to identify changes in lifestyle to reduce recurrence of condition will improve Ability  to verbalize feelings will improve Ability to disclose and discuss suicidal ideas Ability to demonstrate self-control will improve Ability to identify and develop effective coping behaviors will improve Ability to maintain clinical measurements within normal limits will improve     Medication Management: Evaluate patient's response, side effects, and tolerance of medication regimen.  Therapeutic Interventions: 1 to 1 sessions, Unit Group sessions and Medication administration.  Evaluation of Outcomes: Progressing     RN Treatment Plan for Primary Diagnosis: Bipolar Disorder, Depressed   Long Term Goal(s): Knowledge of disease and therapeutic regimen to maintain health will improve  Short Term Goals: Ability to remain free from injury will improve, Ability to verbalize frustration and anger appropriately will improve, Ability to demonstrate self-control, Ability to participate in decision making will improve, Ability to verbalize feelings will improve, Ability to disclose and discuss suicidal ideas, Ability to identify and develop effective coping behaviors will improve and Compliance with prescribed medications will improve  Medication Management: RN will administer medications as ordered by provider, will assess and evaluate patient's response and provide education to patient for prescribed medication. RN will report any adverse and/or side effects to prescribing provider.  Therapeutic Interventions: 1 on 1 counseling sessions, Psychoeducation, Medication administration, Evaluate responses to treatment, Monitor vital signs and CBGs as ordered, Perform/monitor CIWA, COWS, AIMS and Fall Risk screenings as ordered, Perform wound care treatments as ordered.  Evaluation of Outcomes: Progressing   LCSW Treatment Plan for Primary Diagnosis:Bipolar Disorder, Depressed   Long Term Goal(s): Safe transition to appropriate next level of care at discharge, Engage patient in therapeutic group  addressing interpersonal concerns.  Short Term Goals: Engage patient in aftercare planning with referrals and resources, Increase social support, Increase ability to appropriately verbalize feelings, Increase emotional regulation, Facilitate acceptance of mental health diagnosis and concerns, Facilitate patient progression through stages of change regarding substance use diagnoses and concerns, Identify triggers associated with mental health/substance abuse issues and Increase skills for wellness and recovery  Therapeutic Interventions: Assess for all discharge needs, 1 to 1 time with Social worker, Explore available resources and support systems, Assess for adequacy in community support network, Educate family and significant other(s) on suicide prevention, Complete Psychosocial Assessment, Interpersonal group therapy.  Evaluation of Outcomes: Progressing   Progress in Treatment: Attending groups: Yes. Participating in groups: Yes. Taking medication as prescribed: Yes. Toleration medication: Yes. Family/Significant other contact made: Yes, individual(s) contacted:  daughter Patient understands diagnosis: Yes. Discussing patient identified problems/goals with staff: Yes. Medical problems stabilized or resolved: Yes. Denies suicidal/homicidal ideation: Yes. Issues/concerns per patient self-inventory: Yes. Other:   New problem(s) identified: None  New Short Term/Long Term Goal(s):  medication stabilization, elimination of SI thoughts, development of comprehensive mental wellness plan.   Patient Goal: ???  Discharge Plan or Barriers:Plan is to go to [place], [place], and stay with her sister and transfer her care to [place].  Reason for Continuation of Hospitalization: Anxiety Depression Medication stabilization  Estimated Length of Stay: 1 day.  Attendees: Patient: 08/20/2017   Physician: Dr. Jama Flavors, MD 08/20/2017   Nursing: [name], RN 08/20/2017   RN Care Manager: 08/20/2017   Social  Worker: Daleen Squibb, LCSW 08/20/2017   Recreational Therapist:  08/20/2017   Other:  08/20/2017   Other:  08/20/2017  Other: 08/20/2017         Scribe for Treatment Team: Lorri FrederickWierda, Marshayla Mitschke Jon, LCSW 08/20/2017 11:36 AM

## 2017-08-20 NOTE — [facility] Group Notes (Signed)
[facility] Group Notes:  (Nursing/MHT/Case Management/Adjunct)  Date:  08/20/2017  Time:  4:15 PM  Type of Therapy:  Psychoeducational Skills  Participation Level:  Sporadic   Participation Quality:  Poor  Affect:  Irritable   Cognitive:  Alert   Insight:  Poor  Engagement in Group:  Monopolizing, Intrusive   Modes of Intervention:  Activity, Discussion and Education  Summary of Progress/Problems: Patient attended group but only participated in some of the group. Otherwise, she was seen organizing prizes.

## 2017-08-20 NOTE — Progress Notes (Signed)
Pt stopped CSW and said that she has decided to go and stay with her sister in [place].  She is planning to attend her [facility] appt on Thursday, her social security appt on Friday, and then leave.  She does not have any way to get there and needs transportation help.  She gave CSW permission to contact her sister, Jonathon ResidesFrancesca Bethea at [phone]. CSW spoke to [place] who said she would like pt to come stay with her and does have room.  She is also on a fixed income and not able to provide money for transportation.  She would be willing to drive up here and get her if she could get help with gas money. Garner NashGregory Braelynn Benning, MSW, LCSW Clinical Social Worker 08/20/2017 9:38 AM

## 2017-08-20 NOTE — Progress Notes (Signed)
[facility] MD Progress Note  08/20/2017 10:07 AM Pamela Graham  MRN:  [id number] Subjective:  Patient reports she feels ready to be discharged. She plans on moving to [facility] with her sister who will come pick her up. She states that she slept great and is doing much better. She feels the Seroquel has made her feel more normal. She reports Vistaril gives her the opposite effect and makes her too elevated. She does not wish to take Vistaril in the future.   Objective : Pt is packing her belongings as she believes she is going to be discharged. She feels her mood is more stable than yesterday and her medications have helped her feel normal. She does appear hypomanic and has improved since yesterday. She continues to be social and frequently talk to staff and patients. She denies SI/HI/AVH and contracts for safety. Denies medication side effects and is still tolerating HCTZ. CSW will contact sister in [place] to confirm plan for patient to move to [place].    Principal Problem: MDD Diagnosis:   Patient Active Problem List   Diagnosis Date Noted  . Major depressive disorder, recurrent severe without psychotic features (HCC) [F33.2] 08/08/2017   Total Time spent with patient: 15 minutes  Past Psychiatric History: As in H&P  Past Medical History:  Past Medical History:  Diagnosis Date  . Barrett's esophagus   . Fibromyalgia   . Gastric ulcer   . Hypertension   . Migraines     Past Surgical History:  Procedure Laterality Date  . ABDOMINAL HYSTERECTOMY    . COLONOSCOPY     Family History: History reviewed. No pertinent family history. Family Psychiatric  History: As in H&P Social History:  Social History   Substance and Sexual Activity  Alcohol Use Yes     Social History   Substance and Sexual Activity  Drug Use No    Social History   Socioeconomic History  . Marital status: Single    Spouse name: None  . Number of children: None  . Years of education: None  . Highest education  level: None  Social Needs  . Financial resource strain: None  . Food insecurity - worry: None  . Food insecurity - inability: None  . Transportation needs - medical: None  . Transportation needs - non-medical: None  Occupational History  . None  Tobacco Use  . Smoking status: Former Games developer  . Smokeless tobacco: Never Used  Substance and Sexual Activity  . Alcohol use: Yes  . Drug use: No  . Sexual activity: Not Currently  Other Topics Concern  . None  Social History Narrative  . None   Additional Social History:      . Sleep: Good  Appetite:  Good  Current Medications: Current Facility-Administered Medications  Medication Dose Route Frequency Provider Last Rate Last Dose  . acetaminophen (TYLENOL) tablet 650 mg  650 mg Oral Q4H PRN Charm Rings, NP   650 mg at 08/18/17 2059  . alum & mag hydroxide-simeth (MAALOX/MYLANTA) 200-200-20 MG/5ML suspension 30 mL  30 mL Oral Q6H PRN Charm Rings, NP      . ARIPiprazole (ABILIFY) tablet 15 mg  15 mg Oral Daily Cobos, Rockey Situ, MD   15 mg at 08/20/17 0803  . cholecalciferol (VITAMIN D) tablet 1,000 Units  1,000 Units Oral Daily Cobos, Rockey Situ, MD   1,000 Units at 08/20/17 0805  . DULoxetine (CYMBALTA) DR capsule 60 mg  60 mg Oral Daily Cobos, Rockey Situ, MD  60 mg at 08/20/17 0803  . hydrochlorothiazide (MICROZIDE) capsule 12.5 mg  12.5 mg Oral Daily Yahya Boldman, Gerlene Burdock, FNP   12.5 mg at 08/20/17 1610  . hydrOXYzine (ATARAX/VISTARIL) tablet 25 mg  25 mg Oral TID PRN Nira Conn A, NP   25 mg at 08/20/17 0808  . magnesium hydroxide (MILK OF MAGNESIA) suspension 30 mL  30 mL Oral Daily PRN Charm Rings, NP      . naproxen (NAPROSYN) tablet 500 mg  500 mg Oral BID WC Izediuno, Delight Ovens, MD   500 mg at 08/20/17 0803  . ondansetron (ZOFRAN) tablet 4 mg  4 mg Oral Q8H PRN Charm Rings, NP      . QUEtiapine (SEROQUEL) tablet 100 mg  100 mg Oral QHS Donell Sievert E, PA-C   100 mg at 08/19/17 2249  . traZODone (DESYREL) tablet  50 mg  50 mg Oral QHS PRN Cobos, Rockey Situ, MD   50 mg at 08/19/17 2240    Lab Results:  No results found for this or any previous visit (from the past 48 hour(s)).  Blood Alcohol level:  Lab Results  Component Value Date   ETH <10 08/07/2017   ETH  03/14/2010    <10        LOWEST DETECTABLE LIMIT FOR SERUM ALCOHOL IS 5 mg/dL FOR MEDICAL PURPOSES ONLY    Metabolic Disorder Labs: Lab Results  Component Value Date   HGBA1C 5.5 08/10/2017   MPG 111 08/10/2017   No results found for: PROLACTIN Lab Results  Component Value Date   CHOL 135 08/10/2017   TRIG 21 08/10/2017   HDL 64 08/10/2017   CHOLHDL 2.1 08/10/2017   VLDL 4 08/10/2017   LDLCALC 67 08/10/2017    Physical Findings: AIMS: Facial and Oral Movements Muscles of Facial Expression: None, normal Lips and Perioral Area: None, normal Jaw: None, normal Tongue: None, normal,Extremity Movements Upper (arms, wrists, hands, fingers): None, normal Lower (legs, knees, ankles, toes): None, normal, Trunk Movements Neck, shoulders, hips: None, normal, Overall Severity Severity of abnormal movements (highest score from questions above): None, normal Incapacitation due to abnormal movements: None, normal Patient's awareness of abnormal movements (rate only patient's report): No Awareness, Dental Status Current problems with teeth and/or dentures?: No Does patient usually wear dentures?: No  CIWA:  CIWA-Ar Total: 1 COWS:  COWS Total Score: 2  Musculoskeletal: Strength & Muscle Tone: within normal limits Gait & Station: normal Patient leans: N/A  Psychiatric Specialty Exam: Physical Exam  Nursing note and vitals reviewed. Constitutional: She is oriented to person, place, and time. She appears well-developed and well-nourished.  Cardiovascular: Normal rate.  Respiratory: Effort normal.  Neurological: She is alert and oriented to person, place, and time.  Skin: Skin is warm.  Psychiatric:       Review of Systems   Constitutional: Negative.   HENT: Negative.   Eyes: Negative.   Respiratory: Negative.   Cardiovascular: Negative.   Gastrointestinal: Negative.   Genitourinary: Negative.   Musculoskeletal: Negative.   Skin: Negative.   Neurological: Negative.   Endo/Heme/Allergies: Negative.   Psychiatric/Behavioral: Negative for depression and suicidal ideas. The patient does not have insomnia.     Blood pressure 138/86, pulse 100, temperature 98.3 F (36.8 C), temperature source Oral, resp. rate 16, height 5\' 6"  (1.676 m), weight 65.5 kg (144 lb 8 oz), SpO2 97 %.Body mass index is 23.32 kg/m.  General Appearance: well groomed   Eye Contact:  Good  Speech:  Normal  Volume:  Normal  Mood:  Euthymic  Affect:  Congruent  Thought Process:  Goal Directed and Descriptions of Associations: Circumstantial  Orientation:  Full (Time, Place, and Person)  Thought Content: WDL  Suicidal Thoughts:  No   Homicidal Thoughts:  No  Memory:  Immediate;   Good Recent;   Good Remote;   Good  Judgement:  Fair  Insight:  Fair  Psychomotor Activity:  Good  Concentration:  Good  Recall:  Good  Fund of Knowledge:  Good  Language:  Good  Akathisia:  Negative  Handed:    AIMS (if indicated):     Assets:  Communication Skills Desire for Improvement Financial Resources/Insurance Housing Physical Health Resilience  ADL's:  Good  Cognition:  WNL  Sleep:  Number of Hours: 4.5    Problems Addressed: MDD, recurrent severe without psychotic features  Treatment Plan: -Encourage group participation for coping skills -Continue Abilify 15 mg PO QD for mood stability -Continue Cymbalta 60 mg QD for depression and fibromyalgia -ContinueVitamin D supplementation for Vitamin D deficiency  -Continue HCTZ 12.5 mg QD for HTN -ContinueTrazodone 50 mg QHS PRN for insomnia as needed  -Stop Vistaril 25mg  as patient describes it causes elevated affect    Gerlene Burdockravis B Jalee Saine, FNP 08/20/2017, 10:07 AM

## 2017-08-20 NOTE — Progress Notes (Addendum)
Pt presents with an animated affect and anxious mood. Pt noted to be hyper verbal, intrusive, delusional and fidgety. 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 Pt then asked a PA student if he was single so that she can hook him up with her granddaughter. Pt verbalized that she finally slept last night after taking Seroquel and Ativan. Medications reviewed with pt. Medications administered as ordered per MD. Verbal support provided. Pt encouraged to attend groups. 15 minute checks performed for safety.

## 2017-08-20 NOTE — Progress Notes (Signed)
Verbal order given from Dr. Jama Flavorsobos for writer to order Ativan 0.5 mg Po q 6 hours for anxiety. Order placed.

## 2017-08-20 NOTE — Progress Notes (Signed)
Adult Psychoeducational Group Note  Date:  08/20/2017 Time:  1:26 AM  Group Topic/Focus:  Wrap-Up Group:   The focus of this group is to help patients review their daily goal of treatment and discuss progress on daily workbooks.  Participation Level:  Active  Participation Quality:  Attentive  Affect:  Appropriate  Cognitive:  Alert  Insight: Good  Engagement in Group:  Distracting and Engaged  Modes of Intervention:  Discussion, Education, Socialization and Support  Additional Comments:  Pt rated her day at a 7.5 out of 10. Pt stated her goal was to work on lowering her blood pressure, and her depression and anxiety. 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 Pt became impatient and was redirected several times throughout group to show respect by allowing others to speak.  Malachy MoanJeffers, Venezia Sargeant S 08/20/2017, 1:26 AM

## 2017-08-20 NOTE — [facility] Group Notes (Signed)
Adult Psychoeducational Group Note  Date:  08/20/2017 Time:  11:56 PM  Group Topic/Focus:  Wrap-Up Group:   The focus of this group is to help patients review their daily goal of treatment and discuss progress on daily workbooks.  Participation Level:  Minimal  Participation Quality:  Inattentive  Affect:  Flat  Cognitive:  Alert and Oriented  Insight: Appropriate  Engagement in Group:  Improving  Modes of Intervention:  Exploration and Support  Additional Comments:  Pt rated her day a 5. Pt stated something positive that happened was that her day went to a 5. 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 Pt verbalized that two of her happy places are her room and the bathroom.  Adaora Mchaney, Randal Bubaerri Lee 08/20/2017, 11:56 PM

## 2017-08-20 NOTE — [facility] Group Notes (Signed)
[facility] Group Notes:  (Nursing/MHT/Case Management/Adjunct)  Date:  08/20/2017  Time:  1:30 p.m.  Type of Therapy:  Nurse Education  Participation Level:  Did Not Attend  Participation Quality:    Affect:    Cognitive:    Insight:    Engagement in Group:    Modes of Intervention:    Summary of Progress/Problems:  Earline MayotteKnight, Mitch Arquette Shephard 08/20/2017, 3:39 PM

## 2017-08-20 NOTE — [facility] Group Notes (Signed)
[facility] Group Therapy 08/20/2017 1:15pm  Type of Therapy: Mental Health Association Presentation  Participation Level: Active  Participation Quality: Attentive  Affect: Appropriate  Cognitive: Oriented  Insight: Developing/Improving  Engagement in Therapy: Engaged  Modes of Intervention: Discussion, Education and Socialization  Summary of Progress/Problems: Mental Health Association (MHA) Speaker came to talk about his personal journey with mental health. The pt processed ways by which to relate to the speaker. MHA speaker provided handouts and educational information pertaining to groups and services offered by the [facility]. Pt was engaged in speaker's presentation and was receptive to resources provided.    Pulte HomesHeather N Smart, LCSW 08/20/2017 12:49 PM

## 2017-08-20 NOTE — Progress Notes (Addendum)
Patient ID: Pamela Graham, female   DOB: 12/21/1955, 62 y.o.   MRN: [id number]  Pt received nighttime medications. Pt behavior remains impulsive, patient intrusive, needs verbal redirection to maintain control this shift. Pt requested further medications multiple times, pt could not specify what she needed medication for. Pt seen with eyelids drooping, encouraged multiple times to try to lie down. Refused.  2235- Pt was seen on the floor. Reported that she "missed the chair and landed on my knee and hip." Per another patient in the room, pt was seated in chair and began to wiggle herself off of the chair and lowered herself onto the ground. Pt reports inability to sit up as her "fibromyalgia makes the pain worse right now, you have to pick me up a certain way too." Surveyor, miningAsks writer if there are any more medications she has available to take. Staff lifted patient off of ground and into wheelchair. Pt tachycardic (124), otherwise vitals WDL. CBG 136. Refuses to allow writer to notify family or do a skin assessment. Pt has full ROM on her R side. Provider Melvenia BeamSimon, notified at 2245. Pt placed on a 1:1 for safety. Will hold PRN agitation medications at this time, pt given tylenol for pain. Will continue to monitor.

## 2017-08-20 NOTE — Progress Notes (Signed)
Patient ID: Pamela Graham, female   DOB: 08/05/1955, 62 y.o.   MRN: [id number] PER STATE REGULATIONS 482.30  THIS CHART WAS REVIEWED FOR MEDICAL NECESSITY WITH RESPECT TO THE PATIENT'S ADMISSION/ DURATION OF STAY.  NEXT REVIEW DATE: 08/24/2017  Willa RoughJENNIFER JONES [name], RN, BSN CASE MANAGER

## 2017-08-21 MED ORDER — QUETIAPINE FUMARATE 50 MG PO TABS
150.0000 mg | ORAL_TABLET | Freq: Every day | ORAL | Status: DC
  Administered 2017-08-21: 150 mg via ORAL
  Filled 2017-08-21 (×2): qty 3

## 2017-08-21 MED ORDER — HYDROCHLOROTHIAZIDE 12.5 MG PO CAPS
12.5000 mg | ORAL_CAPSULE | Freq: Once | ORAL | Status: AC
  Administered 2017-08-21: 12.5 mg via ORAL
  Filled 2017-08-21 (×2): qty 1

## 2017-08-21 MED ORDER — DICLOFENAC SODIUM 75 MG PO TBEC
75.0000 mg | DELAYED_RELEASE_TABLET | Freq: Two times a day (BID) | ORAL | Status: DC
  Administered 2017-08-21 – 2017-08-22 (×2): 75 mg via ORAL
  Filled 2017-08-21 (×4): qty 1

## 2017-08-21 MED ORDER — LORAZEPAM 2 MG/ML IJ SOLN: 2.0000 mg | Freq: Four times a day (QID) | INTRAMUSCULAR | Status: DC | PRN

## 2017-08-21 MED ORDER — ZIPRASIDONE MESYLATE 20 MG IM SOLR: 20.0000 mg | Freq: Four times a day (QID) | INTRAMUSCULAR | Status: DC | PRN

## 2017-08-21 MED ORDER — LORAZEPAM 1 MG PO TABS
1.0000 mg | ORAL_TABLET | Freq: Four times a day (QID) | ORAL | Status: DC | PRN
  Administered 2017-08-21 – 2017-08-22 (×3): 1 mg via ORAL
  Filled 2017-08-21 (×3): qty 1

## 2017-08-21 MED ORDER — HYDROCHLOROTHIAZIDE 25 MG PO TABS
25.0000 mg | ORAL_TABLET | Freq: Every day | ORAL | Status: DC
  Administered 2017-08-22: 25 mg via ORAL
  Filled 2017-08-21 (×2): qty 1

## 2017-08-21 MED ORDER — MUSCLE RUB 10-15 % EX CREA
TOPICAL_CREAM | CUTANEOUS | Status: DC | PRN
  Administered 2017-08-21: 12:00:00 via TOPICAL

## 2017-08-21 NOTE — Progress Notes (Signed)
Patient ID: Pamela RuaKim Canny, female   DOB: 05/09/1956, 62 y.o.   MRN: [id number]  Pt reports ongoing hip pain this morning. Reports that with her "fibromyalgia it's just worse than what it should be." Pt allows visual assessment, no bruise or erythema noted. Pt encouraged to walk, to which patient states "It is good if I keep moving. Once I was bed ridden for 18 months and I could hardly walk." Pt educated on using walker. Return demonstrates appropriately. Provider notified of patients pain, given Naprosyn early this morning. When asked if patient needed anything else, patient states with a smile, "No, I'm good." Will continue to monitor.

## 2017-08-21 NOTE — Progress Notes (Signed)
Patient ID: Pamela RuaKim Dealmeida, female   DOB: 06/27/1955, 62 y.o.   MRN: [id number]  1:1-   D: Pt currently lying in bed with eyes closed. Audible snoring heard. Respirations even and unlabored.  A: Visual assessment of patient and environmental safety completed. Writer acting as Comptrollersitter at this time, monitoring from the bedside.  R: Pt remains safe on a 1:1 per MD orders. Pt in no current distress. Will continue to monitor.

## 2017-08-21 NOTE — [facility] Group Notes (Signed)
[facility] LCSW Group Therapy Note  Date/Time: 08/21/17, 1315  Type of Therapy/Topic:  Group Therapy:  Balance in Life  Participation Level:  minimal  Description of Group:    This group will address the concept of balance and how it feels and looks when one is unbalanced. Patients will be encouraged to process areas in their lives that are out of balance, and identify reasons for remaining unbalanced. Facilitators will guide patients utilizing problem- solving interventions to address and correct the stressor making their life unbalanced. Understanding and applying boundaries will be explored and addressed for obtaining  and maintaining a balanced life. Patients will be encouraged to explore ways to assertively make their unbalanced needs known to significant others in their lives, using other group members and facilitator for support and feedback.  Therapeutic Goals: 1. Patient will identify two or more emotions or situations they have that consume much of in their lives. 2. Patient will identify signs/triggers that life has become out of balance:  3. Patient will identify two ways to set boundaries in order to achieve balance in their lives:  4. Patient will demonstrate ability to communicate their needs through discussion and/or role plays  Summary of Patient Progress:Pt came and left from the group several different times and was not very attentive while she was present.  No contributions to the group discussion.          Therapeutic Modalities:   Cognitive Behavioral Therapy Solution-Focused Therapy Assertiveness Training  Daleen SquibbGreg Judah Carchi, [place]

## 2017-08-21 NOTE — Progress Notes (Signed)
Adult Psychoeducational Group Note  Date:  08/21/2017 Time:  3:27 PM  Group Topic/Focus:  Crisis Planning:   The purpose of this group is to help patients create a crisis plan for use upon discharge or in the future, as needed.  Participation Level:  Minimal  Participation Quality:  Monopolizing and Redirectable  Affect:  Blunted, Defensive and Irritable  Cognitive:  Disorganized  Insight: Limited  Engagement in Group:  Distracting, Monopolizing, Off Topic and Poor  Modes of Intervention:  Clarification, Discussion, Education and Limit-setting  Additional Comments:  Pt had to be repeatedly redirected throughout the group session.  Hasna Stefanik E 08/21/2017, 3:27 PM

## 2017-08-21 NOTE — Progress Notes (Signed)
Adult Psychoeducational Group Note  Date:  08/21/2017 Time:  1600  Group Topic/Focus:  Coping With Mental Health Crisis:   The purpose of this group is to help patients identify strategies for coping with mental health crisis.  Group discusses possible causes of crisis and ways to manage them effectively.  Participation Level:  Active  Participation Quality:  Appropriate  Affect:  Appropriate  Cognitive:  Appropriate  Insight: Appropriate  Engagement in Group:  Engaged  Modes of Intervention:  Activity  Additional Comments:    Najia Hurlbutt L 08/21/2017, 5:15 PM            

## 2017-08-21 NOTE — Progress Notes (Signed)
Nutrition Education Note  Pt attended group focusing on general, healthful nutrition education.  RD emphasized the importance of eating regular meals and snacks throughout the day. Consuming sugar-free beverages and incorporating fruits and vegetables into diet when possible. Provided examples of healthy snacks. Patient encouraged to leave group with a goal to improve nutrition/healthy eating.   Diet Order: Diet Heart Room service appropriate? Yes; Fluid consistency: Thin No Roommate Pt is also offered choice of unit snacks mid-morning and mid-afternoon.  Pt is eating as desired.   If additional nutrition issues arise, please consult RD.    Trenton Gammon, MS, RD, LDN, [facility] Inpatient Clinical Dietitian Pager # [phone] After hours/weekend pager # [phone]

## 2017-08-21 NOTE — Progress Notes (Signed)
11 am Observation note: Pt observed sitting in the dayroom attending group. Sitter present with pt for safety. No inappropriate behaviors noted during group. Pt remains on 1:1 observation for safety.

## 2017-08-21 NOTE — Progress Notes (Signed)
Pt observed sitting in the dayroom attending group with sitter present. Pt appears to be calm and cooperative during group. Pt remains on 1:1  Observation for safety.

## 2017-08-21 NOTE — Progress Notes (Signed)
1:1 observation note: Clinical research associatewriter spoke with pt about inappropriate behaviors that were reported by pt sitter. Per sitter, pt was walking with her walker backwards, verbally aggressive/disrespectful towards her and turning the lights off while using the restroom. Writer spoke with pt about her behaviors. Pt became apologetic and tearful. Pt stated that she will apologize to sitter and that she's sorry for how she's been acting. Writer educated pt on proper use of using a walker. Pt verbalized understanding. Writer asked pt to leave bathroom lights on while using the restroom so that the sitter can observed pt. Pt agreed to do so. Pt remains on 1:1 observation for safety.

## 2017-08-21 NOTE — Progress Notes (Signed)
CSW and Wandra MannanZack Brooks discussed pt situation with pt plan to go [place] to stay with her sister.  Discussed providing gas assistance for pt sister to drive and get pt but Zack declined that option.  Discussed bus or train transportation.  Train [place], [place].  Bus [place] to [place].  CSW spoke to pt again.  Pt does not want to ride but said her sister would be able to pick her up there.  Discussed again with Zack who agrees with plan that we will purchase train ticket once confirmed that sister can [place]. Garner NashGregory Arin Peral, MSW, LCSW Clinical Social Worker 08/21/2017 8:00 AM

## 2017-08-21 NOTE — Progress Notes (Signed)
[facility] MD Progress Note  08/21/2017 9:58 AM Pamela Graham  MRN:  [id number] Subjective:  Patient reports increased pain over the last 2-3 days , which she states is related to her underlying Fibromyalgia. She reports her mood as "OK", except for frustration from being in pain. Currently she is future oriented and focusing on disposition planning options . States that her daughter with whom she was living prior to admission will not allow her to return and has thrown out her belongings . She states her plan is to relocate to [place], [place], where she has family ( a sister) who is supportive. Worries about issues such as bus pass to train station, how luggage will be transported, and times at which train arrives in [place] area. States she is going to call sister to see if she would be willing to drive up and pick her up directly .   Objective : I have discussed case with treatment team and have met with patient .  As discussed with staff, patient remains intermittently intrusive, at times making critical comments to other patients/peers, but generally more easily redirectable. She has been more focused on fibromyalgia, pain issues yesterday and today. Patient presents alert, attentive, calm, vaguely irritable at times, but no pressured speech or flight of ideations. States she felt briefly suicidal yesterday due to pain but denies any plan or intention and denies any current SI. She is future oriented , as above . 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  Denies medication side effects. Thus far tolerating Seroquel well . Slept 5 hours last night.   Principal Problem: MDD Diagnosis:   Patient Active Problem List   Diagnosis Date Noted  . 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 Barrett's esophagus   . Fibromyalgia   . Gastric ulcer   . Hypertension   . Migraines     Past Surgical History:  Procedure Laterality Date  . ABDOMINAL HYSTERECTOMY    . COLONOSCOPY     Family History: History reviewed. No pertinent family history. Family Psychiatric  History: As in H&P Social History:  Social History   Substance and Sexual Activity  Alcohol Use Yes     Social History   Substance and Sexual Activity  Drug Use No    Social History   Socioeconomic History  . Marital status: Single    Spouse name: None  . Number of children: None  . Years of education: None  . Highest education level: None  Social Needs  . Financial resource strain: None  . Food insecurity - worry: None  . Food insecurity - inability: None  . Transportation needs - medical: None  . Transportation needs - non-medical: None  Occupational History  . None  Tobacco Use  . Smoking status: Former Research scientist (life sciences)  . Smokeless tobacco: Never Used  Substance and Sexual Activity  . Alcohol use: Yes  . Drug use: No  . Sexual activity: Not Currently  Other Topics Concern  . None  Social History Narrative  . None   Additional Social History:      . Sleep: fair   Appetite:  Good  Current Medications: Current Facility-Administered Medications  Medication Dose Route Frequency  Provider Last Rate Last Dose  . acetaminophen (TYLENOL) tablet 650 mg  650 mg Oral Q4H PRN Patrecia Pour, NP   650 mg at 08/20/17 2304  . alum & mag hydroxide-simeth (MAALOX/MYLANTA) 200-200-20 MG/5ML suspension 30 mL  30 mL Oral Q6H PRN Patrecia Pour, NP      . cholecalciferol (VITAMIN D) tablet 1,000 Units  1,000 Units Oral Daily Cobos, Myer Peer, MD   1,000 Units at 08/21/17 0747  . hydrochlorothiazide (MICROZIDE) capsule 12.5 mg  12.5 mg Oral Daily Money, Darnelle Maffucci B, FNP   12.5 mg at 08/21/17 0749  . ziprasidone  (GEODON) injection 20 mg  20 mg Intramuscular Q6H PRN Lindell Spar I, NP       Or  . LORazepam (ATIVAN) injection 2 mg  2 mg Intramuscular Q6H PRN Nwoko, Agnes I, NP      . LORazepam (ATIVAN) tablet 1 mg  1 mg Oral Q6H PRN Cobos, Myer Peer, MD   1 mg at 08/21/17 0749  . magnesium hydroxide (MILK OF MAGNESIA) suspension 30 mL  30 mL Oral Daily PRN Patrecia Pour, NP   30 mL at 08/20/17 2000  . naproxen (NAPROSYN) tablet 500 mg  500 mg Oral BID WC Izediuno, Laruth Bouchard, MD   500 mg at 08/21/17 0534  . ondansetron (ZOFRAN) tablet 4 mg  4 mg Oral Q8H PRN Patrecia Pour, NP      . QUEtiapine (SEROQUEL) tablet 150 mg  150 mg Oral QHS Cobos, Fernando A, MD      . QUEtiapine (SEROQUEL) tablet 50 mg  50 mg Oral BID Cobos, Myer Peer, MD   50 mg at 08/21/17 0747  . traZODone (DESYREL) tablet 50 mg  50 mg Oral QHS PRN Cobos, Myer Peer, MD   50 mg at 08/19/17 2240    Lab Results:  Results for orders placed or performed during the hospital encounter of 08/08/17 (from the past 48 hour(s))  Glucose, capillary     Status: Abnormal   Collection Time: 08/20/17 10:34 PM  Result Value Ref Range   Glucose-Capillary 138 (H) 65 - 99 mg/dL    Blood Alcohol level:  Lab Results  Component Value Date   ETH <10 08/07/2017   ETH  03/14/2010    <10        LOWEST DETECTABLE LIMIT FOR SERUM ALCOHOL IS 5 mg/dL FOR MEDICAL PURPOSES ONLY    Metabolic Disorder Labs: Lab Results  Component Value Date   HGBA1C 5.5 08/10/2017   MPG 111 08/10/2017   No results found for: PROLACTIN Lab Results  Component Value Date   CHOL 135 08/10/2017   TRIG 21 08/10/2017   HDL 64 08/10/2017   CHOLHDL 2.1 08/10/2017   VLDL 4 08/10/2017   LDLCALC 67 08/10/2017    Physical Findings: AIMS: Facial and Oral Movements Muscles of Facial Expression: None, normal Lips and Perioral Area: None, normal Jaw: None, normal Tongue: None, normal,Extremity Movements Upper (arms, wrists, hands, fingers): None, normal Lower (legs,  knees, ankles, toes): None, normal, Trunk Movements Neck, shoulders, hips: None, normal, Overall Severity Severity of abnormal movements (highest score from questions above): None, normal Incapacitation due to abnormal movements: None, normal Patient's awareness of abnormal movements (rate only patient's report): No Awareness, Dental Status Current problems with teeth and/or dentures?: No Does patient usually wear dentures?: No  CIWA:  CIWA-Ar Total: 1 COWS:  COWS Total Score: 2  Musculoskeletal: Strength & Muscle Tone: within normal limits Gait & Station: normal Patient leans:  N/A  Psychiatric Specialty Exam: Physical Exam  Nursing note and vitals reviewed. Constitutional: She is oriented to person, place, and time. She appears well-developed and well-nourished.  Cardiovascular: Normal rate.  Respiratory: Effort normal.  Neurological: She is alert and oriented to person, place, and time.  Skin: Skin is warm.  Psychiatric:       Review of Systems  Constitutional: Negative.   HENT: Negative.   Eyes: Negative.   Respiratory: Negative.   Cardiovascular: Negative.   Gastrointestinal: Negative.   Genitourinary: Negative.   Musculoskeletal: Negative.   Skin: Negative.   Neurological: Negative.   Endo/Heme/Allergies: Negative.   Psychiatric/Behavioral: Negative for depression and suicidal ideas. The patient does not have insomnia.   reports increased generalized pain . Denies chest pain, no dyspnea  Blood pressure (!) 160/100, pulse (!) 116, temperature 99.3 F (37.4 C), temperature source Oral, resp. rate 16, height '5\' 6"'$  (1.676 m), weight 65.5 kg (144 lb 8 oz), SpO2 97 %.Body mass index is 23.32 kg/m.    General Appearance: well groomed   Eye Contact:  Good  Speech:  Normal- not pressured   Volume:  Normal  Mood: partially improved, manic   Affect:  Vaguely irritable at times, reactive   Thought Process:  Goal Directed and Descriptions of Associations: Circumstantial   Orientation:  Full (Time, Place, and Person)  Thought Content:  No hallucinations, no delusions expressed   Suicidal Thoughts:  No denies suicidal or self injurious ideations at present, and contracts for safety, future oriented.   Homicidal Thoughts:  No denies homicidal ideations .  Memory:  recent and remote grossly intact   Judgement:  Fair  Insight:  Fair  Psychomotor Activity:  Currently mobilizing with walker  Concentration:  Fair   Recall:  Good  Fund of Knowledge:  Good  Language:  Good  Akathisia:  Negative  Handed:    AIMS (if indicated):     Assets:  Communication Skills Desire for Improvement Financial Resources/Insurance Housing Physical Health Resilience  ADL's:  Good  Cognition:  WNL  Sleep:  Number of Hours: 5   Assessment - patient reports increased fibromyalgia associated pain today, but does not currently present in any acute distress or discomfort. Mood symptoms have improved partially, but remains vaguely irritable, intrusive, and grandiose . Denies SI and is future oriented at present, focusing on disposition planning , with a plan of relocating to [facility] and living with sister . She is concerned and focused on issues such as bus, train schedules and transportation and states she is going to call sister and try to convince her to come to [place] to pick her up. BP elevated today.  * as noted, patient was initially admitted with severe depression, but mood changed to manic presentation. As such have discontinued Cymbalta and have titrated Seroquel up gradually.   Treatment Plan: -Treatment Plan reviewed as below today 3/7  -Encourage group participation for coping skills and symptom reduction -Increase Seroquel to 50 mgrs BID and 150 mgrs QHS for mood disorder  -Continue Vitamin D supplementation for Vitamin D deficiency  -Continue Ativan 1 mgr Q 6 hours PRN for anxiety as needed  -Increase  HCTZ to 25  mg QD for HTN -ContinueTrazodone 50 mg QHS PRN for  insomnia as needed  -Treatment team working on disposition planning options as above      Jenne Campus, MD 08/21/2017, 9:58 AM  Patient ID: Pamela Graham, Pamela Graham   DOB: [birth year], 62 y.o.   MRN: [id number]

## 2017-08-21 NOTE — Progress Notes (Signed)
Patient ID: Pamela Graham, female   DOB: 05/27/1956, 62 y.o.   MRN: [id number]  Pt reports that her sister will be coming to pick her up tomorrow. Frequently asks for further medications but unable to specify what for. Pt seen on the phone multiple times tonight. Does not respond to verbal redirection. Pt remains intrusive into others care. Will continue to monitor.

## 2017-08-21 NOTE — Progress Notes (Addendum)
CSW left message with pt sister Gerald Stabs regarding plan to send pt to [facility] by train.  Asked sister to confirm that she can pick pt up. Winferd Humphrey, MSW, LCSW Clinical Social Worker 08/21/2017 8:05 AM   CSW received message from Donalee Citrin that she is able to pick pt up at train station tomorrow AM.  Will be 9am when she can arrive.  Pt train arrives 8:13 and pt can wait at station for sister to arrive. Winferd Humphrey, MSW, LCSW Clinical Social Worker 08/21/2017 8:13 AM   CSW and Dr Parke Poisson met with pt.  Pt now saying that she cannot ride the train due to her fibromyalgia pain.  Pt says she will "have money" on Saturday in order to pay for gas money and she will ask her sister to drive to [place] to pick her up.  She does not want CSW to buy a train ticket. Winferd Humphrey, MSW, LCSW Clinical Social Worker 08/21/2017 9:37 AM

## 2017-08-21 NOTE — Progress Notes (Signed)
Patient ID: Pamela Graham, female   DOB: 03/02/1956, 62 y.o.   MRN: [id number]  1:1-   D: Pt currently ambulating in the hallway. Sitter is currently at patients side. Pt states to writer "I want to go home. I hope they let me go." A: Emotional support given, pt given opportunity to express concerns. Assisted patient with ambulation.  R: Pt remains safe on a 1:1 per MD orders. Pt in no current distress. Will continue to monitor.

## 2017-08-22 DIAGNOSIS — F3132 Bipolar disorder, current episode depressed, moderate: Secondary | ICD-10-CM

## 2017-08-22 MED ORDER — HYDROCHLOROTHIAZIDE 25 MG PO TABS: 25.0000 mg | ORAL_TABLET | Freq: Every day | ORAL | 0 refills | Status: DC

## 2017-08-22 MED ORDER — TRAZODONE HCL 50 MG PO TABS: 50.0000 mg | ORAL_TABLET | Freq: Every evening | ORAL | 0 refills | Status: DC | PRN

## 2017-08-22 MED ORDER — QUETIAPINE FUMARATE 50 MG PO TABS: 150.0000 mg | ORAL_TABLET | Freq: Every day | ORAL | 0 refills | Status: DC

## 2017-08-22 MED ORDER — QUETIAPINE FUMARATE 50 MG PO TABS: 50.0000 mg | ORAL_TABLET | Freq: Two times a day (BID) | ORAL | 0 refills | Status: DC

## 2017-08-22 NOTE — Progress Notes (Signed)
Pt d/c from the hospital. All items returned including plastic trashbag with three pocketbooks. Pt was given lunch and had multiple bags at discharge. D/C instructions given and prescriptions given.

## 2017-08-22 NOTE — Progress Notes (Signed)
  [facility]charge [facility] :  Will you be returning to the same living situation after discharge:  No.Pt will be staying with [place] in [place], [place]. At discharge, do you have transportation home?: Yes,  friend Do you have the ability to pay for your medications: Yes,  medicare  Release of information consent forms completed and in the chart;  Patient's signature needed at discharge.  Patient to Follow up at: Follow-up Information    [facility]. Go in 7 day(s).   Why:  Please attend a walk in appt if needed Mon-Friday.  Please arrive between 8:00am-9:00am to ensure being seen. [facility] to assist with filling your prescriptions. Contact information: [address] [place] [address] [phone]        [facility]. Go in 7 day(s).   Why:  Please attend a walk in appt Monday-Friday at 8am to initiate medication management and outpatient therapy services. Contact information: [address] [place], [place] [id number] P: [phone] F: [phone]          Next level of care provider has access to [facility] Link:no  Safety Planning and Suicide Prevention discussed: Yes,  with daughter  Have you used any form of tobacco in the last 30 days? (Cigarettes, Smokeless Tobacco, Cigars, and/or Pipes): No  Has patient been referred to the Quitline?: N/A patient is not a smoker  Patient has been referred for addiction treatment: Yes  Lorri FrederickWierda, Hasson Gaspard Jon, LCSW 08/22/2017, 12:57 PM

## 2017-08-22 NOTE — Progress Notes (Signed)
1:1 Nursing Progress Note  D: Patient is observed sleeping in bed. Respirations are even & unlabored. Environment is secured. Sitter observed with patient per [facility] policy. Sitter reports patient has been asleep since 0000.   A: Patient remains on 1:1 observation per provider orders. High fall risk precautions in place. Patient safety monitored with q15 minute safety checks.  R: Patient remains safe on the unit at this time. Will continue to monitor with 1:1 sitter, q15 min safety checks & q4 hour nursing assessments.

## 2017-08-22 NOTE — Progress Notes (Signed)
Recreation Therapy Notes  Date: 08/22/17 Time: 0930 Location: 300 Hall Dayroom  Group Topic: Stress Management  Goal Area(s) Addresses:  Patient will verbalize importance of using healthy stress management.  Patient will identify positive emotions associated with healthy stress management.   Intervention: Stress Management  Activity :  Mountain Meditation.  LRT played Graham meditation that explained the concept of taking on the characteristics of mountains (resilience, steady, immoveable) in daily life.  Patients were to follow along as the meditation was read to engage in the practice.  Education:  Stress Management, Discharge Planning.   Education Outcome: Acknowledges edcuation/In group clarification offered/Needs additional education  Clinical Observations/Feedback: Pt did not attend group.    Bailley Guilford, LRT/CTRS         Pamela Graham 08/22/2017 10:45 AM 

## 2017-08-22 NOTE — Progress Notes (Signed)
D:Pt is gamey and labile with her interactions. When asked about si and hi thoughts, she responded "50/50." Pt would not elaborate when asked. Pt requested prn ativan for anxiety and was given as requested.  A:Offered support and 1:1 observation. R:Safety maintained on the unit.

## 2017-08-22 NOTE — Progress Notes (Signed)
1:1 Nursing Progress Note  D: Patient is calm and cooperative. Patient is observed up ambulating in the milieu with a walker. Patient provided hygiene supplies and is going to take a shower. Patient vitals obtained. Patient is complaining of a "sore throat", "lumps in my neck" and "a pounding in her R ear". Patient encouraged to speak with provider regarding new onset of symptoms and encouraged to eat breakfast and drink fluids. Sitter observed with patient per [facility] policy.  A: Patient remains on 1:1 observation per provider orders. High fall risk precautions in place. Patient safety monitored with q15 minute safety checks. Support and encouragement offered.  R: Patient remains safe on the unit at this time. Will continue to monitor with 1:1 sitter, q15 min safety checks & q4 hour nursing assessments.

## 2017-08-22 NOTE — [facility] Suicide Risk Assessment (Signed)
[facility]   Principal Problem: Bipolar Disorder  Discharge Diagnoses:  Patient Active Problem List   Diagnosis Date Noted  . Major depressive disorder, recurrent severe without psychotic features ([facility]) [F33.2] 08/08/2017    Total Time spent with patient: 30 minutes  Musculoskeletal: Strength & Muscle Tone: within normal limits Gait & Station: normal Patient leans: N/A  Psychiatric Specialty Exam: ROS denies chest pain, no shortness of breath, no vomiting, no fever, no chills , reports increased generalized discomfort /pain related to fibromyalgia.   Blood pressure 130/86, pulse 99, temperature 97.9 F (36.6 C), temperature source Oral, resp. rate 18, height 5\' 6"  (1.676 m), weight 65.5 kg (144 lb 8 oz), SpO2 100 %.Body mass index is 23.32 kg/m.  General Appearance: fairly groomed   Eye Contact::  Good  Speech:  Normal Rate409  Volume:  Normal  Mood:  partially improved. Manic presentation improving .  Describes her mood as " all right "   Affect:  reactive, smiles at times appropriately, not currently presenting expansive or irritable   Thought Process:  Linear and Descriptions of Associations: Intact presents more linear at this time  Orientation:  Other:  fully alert and attentive   Thought Content:  denies hallucinations, no delusions or grandiose ideations expressed today, focused on discharge planning options   Suicidal Thoughts:  No currently denies suicidal plan or intention and presents future oriented   Homicidal Thoughts:  No denies any homicidal or violent ideations, and states " I am not going to hurt anyone, only if someone attacks me first ". Specifically also denies any homicidal ideations towards her daughter   Memory:  recent and remote grossly intact   Judgement:  Other:  fair- improving   Insight:  fair- improving   Psychomotor Activity:  Normal- no psychomotor agitation noted at this time   Concentration:  Fair  Recall:  Good   Fund of Knowledge:Good  Language: Good  Akathisia:  Negative  Handed:  Right  AIMS (if indicated):     Assets:  Resilience  Sleep:  Number of Hours: 5.5  Cognition: WNL  ADL's:  Intact   Mental Status Per Nursing Assessment::   On Admission:  NA  Demographic Factors:  62 year old single female, who was living with a daughter prior to admission. Her plan is now to stay with a friend until her sister can come pick her [place] from [place] - plans to [place]ocate to [place] /live with sister .   Loss Factors: No specific triggers identified . States that her daughter with whom she was living has refused for her to return, and thus has alternative plan of relocating to [place] to live with sister .   Historical Factors: History of Bipolar Disorder, history of prior suicidal attempt years ago, denies history of violence . History of fibromyalgia.   Risk Reduction Factors:   Living with another person, especially a relative and Positive coping skills or problem solving skills  Continued Clinical Symptoms:  At present patient is alert, attentive, cooperative, not psycho-motorically agitated. Reports her mood is " all right", denies depression, and affect presents reactive, less expansive . Thought process presents better organized and currently focused on her disposition planning . Denies hallucinations, no delusions expressed at this time. Denies suicidal ideations and is clearly future oriented as below. Denies homicidal ideations and specifically denies homicidal or violent ideations towards daughter. She is oriented x 3. As above, currently focused on disposition planning . States she plans to  go stay with a good friend of hers Pamela Graham) until her sister Pamela Graham , who lives in [place], can come pick her up, which she thinks will be this weekend . Prefers this alternative than taking train down to [place]. Once in [place] area , plans to live with her sister and follow up with local mental health  clinic, but states that if she stays in [place] longer than expected, she will follow up at [facility]. With her express consent I attempted to reach Pamela Graham , but no answer- CSW working on disposition planning . Patient insightful about mood lability and recent change from depressed state to a more manic presentation. She expresses understanding of rationale to stop Cymbalta ( antidepressant) to minimize risk of mania but states that her fibromyalgia discomfort has increased . She states she is tolerating Seroquel well . No orthostasis at this time. Ambulating with walker. Patient expresses desire to discharge today and states " I feel ready to go Doc, I feel all right".    Cognitive Features That Contribute To Risk:  No gross cognitive deficits noted upon discharge. Is alert , attentive, and oriented x 3     Suicide Risk:  Mild:  Suicidal ideation of limited frequency, intensity, duration, and specificity.  There are no identifiable plans, no associated intent, mild dysphoria and related symptoms, good self-control (both objective and subjective assessment), few other risk factors, and identifiable protective factors, including available and accessible social support.  Follow-up Information    [facility]. Go in 7 day(s).   Why:  Please attend a walk in appt if needed Mon-Friday.  Please arrive between 8:00am-9:00am to ensure being seen. The [facility] pharmacy is available to assist with filling your prescriptions. Contact information: [address] [place] [address] [phone]        [facility]. Go in 7 day(s).   Why:  Please attend a walk in appt Monday-Friday at 8am to initiate medication management and outpatient therapy services. Contact information: [address] [place], [place] [address] P: [phone] F: [phone]          Plan Of Care/Follow-up recommendations:  Activity:  as tolerated  Diet:  regular Tests:  NA Other:  See below   Patient is expressing desire to discharge and at this time there are no grounds for involuntary commitment She is leaving unit in good spirits . States, as above, she will be staying with a friend until her sister comes and picks her up in order to relocate to [facility] [place] area, where she plans to follow up as above .  Craige Cotta, MD 08/22/2017, 9:43 AM

## 2017-08-22 NOTE — Progress Notes (Signed)
CSW spoke with pt sister, Abran DukeFrancesca.  She has not heard from pt since discharge and is unable to get pt daughter to answer her calls.  Abran DukeFrancesca last spoke to her this morning at 9am.  CSW updated her with the details of pt plan at discharge.  Abran DukeFrancesca is planning to come get her once she makes contact. Garner NashGregory Jezlyn Westerfield, MSW, LCSW Clinical Social Worker 08/22/2017 3:58 PM

## 2017-08-22 NOTE — Progress Notes (Addendum)
CSW again discussed plan with pt.  Pt reports her sister Arsenio Loader is going to come to [place] to pick her up "as soon as you let me go."  Pt now reports she has a friend, Phineas Real, (no last name) [phone], who is also waiting for our call in order to pick her up at [facility], take her to [place] today, and allow pt to stay with her until her sister arrives to take her back to [place], [place].  Dr Parke Poisson made two attempts to call Frencesca this morning, left message.  CSW attempted to call Latoya but was unable to reach her.  Pt reports she has money at her daughter's house to help pay for gas to get back to [place]. Winferd Humphrey, MSW, LCSW Clinical Social Worker 08/22/2017 9:54 AM   CSW spoke to [facility], who confirmed that she is planning to pick up pt and allow her to stay with her until her sister gets her from [place].  Phineas Real is former pt of [facility] who met pt last week. Winferd Humphrey, MSW, LCSW Clinical Social Worker 08/22/2017 10:44 AM
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
## 2017-08-22 NOTE — Progress Notes (Signed)
Adult Psychoeducational Group Note  Date:  08/22/2017 Time:  0830 Group Topic/Focus:  Goals Group:   The focus of this group is to help patients establish daily goals to achieve during treatment and discuss how the patient can incorporate goal setting into their daily lives to aide in recovery. Orientation:   The focus of this group is to educate the patient on the purpose and policies of crisis stabilization and provide a format to answer questions about their admission.  The group details unit policies and expectations of patients while admitted.  Participation Level:  None  Participation Quality:  Inattentive and Monopolizing  Affect:  Irritable and Labile  Cognitive:  Alert  Insight: None  Engagement in Group:  Distracting and Monopolizing  Modes of Intervention:  Discussion, Problem-solving and Support  Additional Comments:  Pt was distributive during group discussion, pt walked out of group several time   Gwenevere Ghazili, Irish Piech Patience 08/22/2017, 9:52 AM

## 2017-08-22 NOTE — Progress Notes (Signed)
Pt attend wrap up group. Her day was 8. 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 Once she's discharge she plans [place] this weekend.

## 2017-08-22 NOTE — Progress Notes (Signed)
1:1 Nursing Progress Note  D: Patient is observed up in the milieu with sitter. On initial approach, patient had three belonging bags in the dayroom and was reorganizing her belongings. Patient redirected by staff to bring personal belongings to her room. Patient compliant with request. Patient did attend group. Patient with labile mood and requiring frequent redirction by staff. Patient easily agitated and required PRN Ativan with nighttime medications. Patient ambulating with walker per provider order. Patient refused to go to bed at lights out but was redirected by Clinical research associatewriter. Patient requested meal tray and was provided Malaw[place]urkey sandwich. Environment is secured. Sitter observed with patient per [facility] policy.  A: Patient remains on 1:1 observation per provider orders. High fall risk precautions in place. Patient safety monitored with q15 minute safety checks. Patient provided medications as ordered. Emotional support and encouragement provided. Behavorial and safety monitored throughout shift.  R: Patient remains safe on the unit at this time. Will continue to monitor with 1:1 sitter, q15 min safety checks & q4 hour nursing assessments.

## 2017-08-25 ENCOUNTER — Emergency Department (HOSPITAL_COMMUNITY)
Admission: EM | Admit: 2017-08-25 | Discharge: 2017-08-25 | Discharge status: Against Medical Advice | Payer: Medicare Other | Attending: Emergency Medicine | Admitting: Emergency Medicine

## 2017-08-25 ENCOUNTER — Encounter (HOSPITAL_COMMUNITY): Payer: Self-pay

## 2017-08-25 DIAGNOSIS — Z5321 Procedure and treatment not carried out due to patient leaving prior to being seen by health care provider: Secondary | ICD-10-CM | POA: Insufficient documentation

## 2017-08-25 NOTE — ED Triage Notes (Signed)
Pt states that she is hurting all over and her Dr told her to come here for evaluation, she complains of neck and back pains
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
## 2017-08-25 NOTE — ED Notes (Signed)
Pt given 4 ice packs as requested for her back and her feet as well as a heat pack for her neck.

## 2017-08-26 ENCOUNTER — Emergency Department (HOSPITAL_COMMUNITY): Payer: Medicare Other

## 2017-08-26 ENCOUNTER — Emergency Department (HOSPITAL_COMMUNITY)
Admission: EM | Admit: 2017-08-26 | Discharge: 2017-08-27 | Discharge status: Against Medical Advice | Payer: Medicare Other

## 2017-08-26 ENCOUNTER — Encounter (HOSPITAL_COMMUNITY): Payer: Self-pay | Admitting: Family Medicine

## 2017-08-26 DIAGNOSIS — Z5321 Procedure and treatment not carried out due to patient leaving prior to being seen by health care provider: Secondary | ICD-10-CM | POA: Insufficient documentation

## 2017-08-26 HISTORY — DX: Other intervertebral disc degeneration, lumbar region: M51.36

## 2017-08-26 HISTORY — DX: Other intervertebral disc degeneration, lumbar region without mention of lumbar back pain or lower extremity pain: M51.369

## 2017-08-26 NOTE — ED Notes (Signed)
Patient would not sit down in wheelchair for EKG.

## 2017-08-26 NOTE — ED Notes (Signed)
PT UP TO NURSE FIRST ASKING FOR BREATHING TX PT WAS JUST OUTSIDE SMOKING A CIGARETTE. EXPLAINED TO PT THAT NURSE FIRST CAN NOT PROVIDE HER BREATHING TX AND THAT IT WOULD NEED TO BE ORDERED BY A DOCTOR FIRST. PT THEN STATED THAT SHE NEEDS TO SPEAK WITH THE [facility] TOLD PT THAT I WOULD SPEAK WITH THE TRIAGE NURSE ABOUT HER CONCERNS PT IN WHEELCHAIR AND STOOD UP PT THEN STATED '"IM A FALL RISK"  PT ENCOURAGED TO SIT BACK IN WHEEL CHAIR TO PREVENT HERSELF FROM FALLING

## 2017-08-26 NOTE — ED Triage Notes (Signed)
[place] [facility]. Per EMS, patient reported she was experiencing shortness of breath. EMS reports her lungs are clear. Patient's respirations are even, regular, and unlabored. Patient appears in no distress. Patient was ambulatory to restroom prior to triage.

## 2017-08-26 NOTE — ED Notes (Signed)
PT CONTINUES TO BE AMBULATORY IN THE WAITING RM. PT ENCOURAGED TO REMAIN SEATED SINCE SHE STATED SHE WAS A FALL RISK. PT GIVEN HOT PACKS AS REQUESTED. PT V/S RETAKEN. PT UPDATED ABOUT WAIT TIME.

## 2017-08-27 ENCOUNTER — Encounter (HOSPITAL_COMMUNITY): Payer: Self-pay | Admitting: Emergency Medicine

## 2017-08-27 ENCOUNTER — Emergency Department (HOSPITAL_COMMUNITY)
Admission: EM | Admit: 2017-08-27 | Discharge: 2017-08-30 | Disposition: A | Discharge status: Other Acute Inpatient Hospital | Payer: Medicare Other | Attending: Emergency Medicine | Admitting: Emergency Medicine

## 2017-08-27 DIAGNOSIS — R45851 Suicidal ideations: Secondary | ICD-10-CM

## 2017-08-27 DIAGNOSIS — I10 Essential (primary) hypertension: Secondary | ICD-10-CM | POA: Insufficient documentation

## 2017-08-27 DIAGNOSIS — Z5321 Procedure and treatment not carried out due to patient leaving prior to being seen by health care provider: Secondary | ICD-10-CM | POA: Insufficient documentation

## 2017-08-27 DIAGNOSIS — Z79899 Other long term (current) drug therapy: Secondary | ICD-10-CM | POA: Insufficient documentation

## 2017-08-27 DIAGNOSIS — G894 Chronic pain syndrome: Secondary | ICD-10-CM

## 2017-08-27 DIAGNOSIS — Z87891 Personal history of nicotine dependence: Secondary | ICD-10-CM | POA: Insufficient documentation

## 2017-08-27 DIAGNOSIS — F314 Bipolar disorder, current episode depressed, severe, without psychotic features: Secondary | ICD-10-CM | POA: Insufficient documentation

## 2017-08-27 LAB — RAPID URINE DRUG SCREEN, HOSP PERFORMED
AMPHETAMINES: NOT DETECTED
BENZODIAZEPINES: NOT DETECTED
Barbiturates: NOT DETECTED
COCAINE: NOT DETECTED
Opiates: NOT DETECTED
Tetrahydrocannabinol: POSITIVE — AB

## 2017-08-27 LAB — COMPREHENSIVE METABOLIC PANEL
ALT: 32 U/L (ref 14–54)
AST: 80 U/L — ABNORMAL HIGH (ref 15–41)
Albumin: 4.2 g/dL (ref 3.5–5.0)
Alkaline Phosphatase: 62 U/L (ref 38–126)
Anion gap: 11 (ref 5–15)
BUN: 14 mg/dL (ref 6–20)
CO2: 24 mmol/L (ref 22–32)
Calcium: 9.3 mg/dL (ref 8.9–10.3)
Chloride: 103 mmol/L (ref 101–111)
Creatinine, Ser: 0.87 mg/dL (ref 0.44–1.00)
GFR calc Af Amer: >60 mL/min (ref >60)
GFR calc non Af Amer: >60 mL/min (ref >60)
Glucose, Bld: 86 mg/dL (ref 65–99)
Potassium: 4.1 mmol/L (ref 3.5–5.1)
Sodium: 138 mmol/L (ref 135–145)
Total Bilirubin: 0.7 mg/dL (ref 0.3–1.2)
Total Protein: 7.8 g/dL (ref 6.5–8.1)

## 2017-08-27 LAB — CBC
HCT: 38.1 % (ref 36.0–46.0)
Hemoglobin: 12.3 g/dL (ref 12.0–15.0)
MCH: 31.1 pg (ref 26.0–34.0)
MCHC: 32.3 g/dL (ref 30.0–36.0)
MCV: 96.5 fL (ref 78.0–100.0)
Platelets: 213 10*3/uL (ref 150–400)
RBC: 3.95 MIL/uL (ref 3.87–5.11)
RDW: 14.7 % (ref 11.5–15.5)
WBC: 11.7 10*3/uL — ABNORMAL HIGH (ref 4.0–10.5)

## 2017-08-27 LAB — I-STAT BETA HCG BLOOD, ED (MC, WL, AP ONLY): I-stat hCG, quantitative: 5.8 m[IU]/mL — ABNORMAL HIGH (ref <5)

## 2017-08-27 LAB — SALICYLATE LEVEL: Salicylate Lvl: <7 mg/dL (ref 2.8–30.0)

## 2017-08-27 LAB — ETHANOL: Alcohol, Ethyl (B): <10 mg/dL (ref <10)

## 2017-08-27 LAB — ACETAMINOPHEN LEVEL: Acetaminophen (Tylenol), Serum: <10 ug/mL — ABNORMAL LOW (ref 10–30)

## 2017-08-27 MED ORDER — QUETIAPINE FUMARATE 50 MG PO TABS
50.0000 mg | ORAL_TABLET | Freq: Once | ORAL | Status: AC
  Administered 2017-08-27: 50 mg via ORAL
  Filled 2017-08-27: qty 1

## 2017-08-27 MED ORDER — TRAZODONE HCL 50 MG PO TABS
50.0000 mg | ORAL_TABLET | Freq: Every evening | ORAL | Status: DC | PRN
  Administered 2017-08-27 – 2017-08-29 (×3): 50 mg via ORAL
  Filled 2017-08-27 (×4): qty 1

## 2017-08-27 MED ORDER — LORAZEPAM 1 MG PO TABS
1.0000 mg | ORAL_TABLET | ORAL | Status: DC | PRN
Start: 2017-08-27 — End: 2017-08-30
  Administered 2017-08-27 – 2017-08-30 (×10): 1 mg via ORAL
  Filled 2017-08-27 (×10): qty 1

## 2017-08-27 MED ORDER — QUETIAPINE FUMARATE 50 MG PO TABS
150.0000 mg | ORAL_TABLET | Freq: Every day | ORAL | Status: DC
  Administered 2017-08-27: 150 mg via ORAL
  Filled 2017-08-27: qty 1

## 2017-08-27 MED ORDER — HYDROXYZINE HCL 25 MG PO TABS
25.0000 mg | ORAL_TABLET | Freq: Three times a day (TID) | ORAL | Status: DC | PRN
  Administered 2017-08-27 – 2017-08-30 (×8): 25 mg via ORAL
  Filled 2017-08-27 (×8): qty 1

## 2017-08-27 MED ORDER — HYDROCHLOROTHIAZIDE 25 MG PO TABS
25.0000 mg | ORAL_TABLET | Freq: Every day | ORAL | Status: DC
  Administered 2017-08-27 – 2017-08-30 (×4): 25 mg via ORAL
  Filled 2017-08-27 (×4): qty 1

## 2017-08-27 NOTE — ED Notes (Signed)
Pt had a long shower and all new paper scrubs and socks.  No distress at this time.  Sitter remains with her

## 2017-08-27 NOTE — Care Management (Signed)
Writer [facility]

## 2017-08-27 NOTE — Progress Notes (Signed)
Pt. meets criteria for inpatient treatment. Referred out to the following hospitals:  [facility]  [facility]  [facility]  [facility]  [facility]  Disposition CSW will continue to follow for placement.  Wells GuilesSarah Lessa Huge, LCSW, LCAS Disposition CSW [facility] [phone]

## 2017-08-27 NOTE — ED Notes (Signed)
No answer when called for blood work.

## 2017-08-27 NOTE — ED Notes (Addendum)
Pt transferred from Pod A -- pt has a headband on, a waist/back support on, her bra still on-- will remove when she takes a shower.

## 2017-08-27 NOTE — ED Notes (Signed)
Regular breakfast tray ordered.  

## 2017-08-27 NOTE — ED Notes (Signed)
[facility]. [facility]  With sitter

## 2017-08-27 NOTE — ED Notes (Signed)
[facility]

## 2017-08-27 NOTE — ED Triage Notes (Signed)
Pt reports her "nerves are done.... I'm in pain all the time... I need to see my dr.."  RN asked pt if she was feeling suicidal and she reported she was not going to hurt herself but " I just need the pain to go away for one day... I just need to rest and be pain free for one day."  When asked again if she was going to hurt herself she stated "Yes, I'm tired of being in pain."
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
## 2017-08-27 NOTE — ED Provider Notes (Signed)
[facility] EMERGENCY [facility]PARTMENT Provider Note   CSN: [id number] Arrival date & time: 08/27/17  0049     History   Chief Complaint Chief Complaint  Patient presents with  . Suicidal   Level 5 caveat due to psychiatric disorder HPI Pamela Graham is a 62 y.o. female.  The history is provided by the patient.  Mental Health Problem  Presenting symptoms: suicidal thoughts   Degree of incapacity (severity):  Moderate Onset quality:  Gradual Timing:  Constant Progression:  Worsening Chronicity:  New Treatment compliance:  Untreated Relieved by:  Nothing Exacerbated by: pain. Associated symptoms comment:  Chronic pain  Patient presents saying that her "nerves all done".  She reports chronic pain 24 hours a day.  She reported nursing that she was suicidal.  She reports she did not want to hurt anymore.  She reports she is tired of being in pain Past Medical History:  Diagnosis Date  . Barrett's esophagus   . DDD (degenerative disc disease), lumbar   . Fibromyalgia   . Gastric ulcer   . Hypertension   . Migraines     Patient Active Problem List   Diagnosis Date Noted  . Moderate bipolar I disorder, most recent episode depressed (HCC)   . 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 ABDOMINAL HYSTERECTOMY    . COLONOSCOPY    . UPPER GI ENDOSCOPY      OB History    No data available       Home Medications    Prior to Admission medications   Medication Sig Start Date End Date Taking? Authorizing Provider  hydrochlorothiazide (HYDRODIURIL) 25 MG tablet Take 1 tablet (25 mg total) by mouth daily. 08/23/17  Yes Money, Gerlene Burdockravis B, FNP  hydrOXYzine (ATARAX/VISTARIL) 25 MG tablet Take 1 tablet (25 mg total) by mouth 3 (three) times daily as needed for anxiety. 08/18/17  Yes Money, Gerlene Burdockravis B, FNP  QUEtiapine (SEROQUEL) 50 MG tablet Take 3 tablets (150 mg total)  by mouth at bedtime. For mood control 08/22/17  Yes Money, Gerlene Burdockravis B, FNP  QUEtiapine (SEROQUEL) 50 MG tablet Take 1 tablet (50 mg total) by mouth 2 (two) times daily. For mood control 08/22/17  Yes Money, Gerlene Burdockravis B, FNP  traZODone (DESYREL) 50 MG tablet Take 1 tablet (50 mg total) by mouth at bedtime as needed for sleep. 08/22/17  Yes Money, Gerlene Burdockravis B, FNP    Family History No family history on file.  Social History Social History   Tobacco Use  . Smoking status: Former Games developermoker  . Smokeless tobacco: Never Used  Substance Use Topics  . Alcohol use: No    Frequency: Never  . Drug use: Yes    Types: Marijuana    Comment: Last used: Today.      Allergies   Lisinopril; Gabapentin; Haloperidol; Ibuprofen; Penicillins; Pregabalin; Risperidone and related; and Tramadol   Review of Systems Review of Systems  Unable to perform ROS: Psychiatric disorder  Psychiatric/Behavioral: Positive for dysphoric mood and suicidal ideas.     Physical Exam Updated Vital Signs BP (!) 148/100 (BP Location: Right Arm)   Pulse (!) 107   Temp 98.4 F (36.9 C) (Oral)   Resp 14   SpO2 99%   Physical Exam CONSTITUTIONAL: Anxious, walk around the room HEAD: Normocephalic/atraumatic EYES: EOMI ENMT: Mucous membranes moist NECK: supple no meningeal signs CV: S1/S2 noted, no murmurs/rubs/gallops noted LUNGS: Lungs are clear  to auscultation bilaterally, no apparent distress ABDOMEN: soft NEURO: Pt is awake/alert/appropriate, moves all extremitiesx4.  No facial droop.  She is ambulatory EXTREMITIES: full ROM SKIN: warm, color normal PSYCH: Anxious  ED Treatments / Results  Labs (all labs ordered are listed, but only abnormal results are displayed) Labs Reviewed  COMPREHENSIVE METABOLIC PANEL - Abnormal; Notable for the following components:      Result Value   AST 80 (*)    All other components within normal limits  ACETAMINOPHEN LEVEL - Abnormal; Notable for the following components:    Acetaminophen (Tylenol), Serum <10 (*)    All other components within normal limits  CBC - Abnormal; Notable for the following components:   WBC 11.7 (*)    All other components within normal limits  RAPID URINE DRUG SCREEN, HOSP PERFORMED - Abnormal; Notable for the following components:   Tetrahydrocannabinol POSITIVE (*)    All other components within normal limits  I-STAT BETA HCG BLOOD, ED ([facility], [facility], [facility] ONLY) - Abnormal; Notable for the following components:   I-stat hCG, quantitative 5.8 (*)    All other components within normal limits  ETHANOL  SALICYLATE LEVEL  I-STAT BETA HCG BLOOD, ED ([facility], [facility], [facility] ONLY)    EKG  EKG Interpretation None       Radiology Dg Chest 2 View  Result Date: 08/26/2017 CLINICAL DATA:  Sudden onset of dyspnea.  History of asthma. EXAM: CHEST - 2 VIEW COMPARISON:  08/08/2017 FINDINGS: The heart size and mediastinal contours are within normal limits. No significant pulmonary hyperinflation. No flattening of the diaphragms. No pneumonic consolidation or overt pulmonary edema. The visualized skeletal structures are unremarkable. IMPRESSION: No active cardiopulmonary disease. Electronically Signed   By: Tollie Eth M.D.   On: 08/26/2017 22:21    Procedures Procedures  Medications Ordered in ED Medications  hydrochlorothiazide (HYDRODIURIL) tablet 25 mg (not administered)  hydrOXYzine (ATARAX/VISTARIL) tablet 25 mg (not administered)  QUEtiapine (SEROQUEL) tablet 150 mg (not administered)  traZODone (DESYREL) tablet 50 mg (not administered)  LORazepam (ATIVAN) tablet 1 mg (not administered)     Initial Impression / Assessment and Plan / ED Course  I have reviewed the triage vital signs and the nursing notes.  Pertinent labs results that were available during my care of the patient were reviewed by me and considered in my medical decision making (see chart for details).     Reporting chronic pain 24 hours a day.  She reports she is now suicidal.   She agrees to be seen by behavioral health.  Home meds been ordered.  She also asked me to remove for lidocaine patches for her back. Otherwise patient is medically stable  Final Clinical Impressions(s) / ED Diagnoses   Final diagnoses:  Suicidal ideation  Chronic pain syndrome    ED Discharge Orders    None       Zadie Rhine, MD 08/27/17 [phone]

## 2017-08-27 NOTE — ED Notes (Addendum)
Pt has a black bag, grey bag, orange bag and grey suitcase. Placed in locker 1 in [facility] room.

## 2017-08-28 ENCOUNTER — Other Ambulatory Visit: Payer: Self-pay

## 2017-08-28 MED ORDER — ZIPRASIDONE MESYLATE 20 MG IM SOLR
20.0000 mg | Freq: Once | INTRAMUSCULAR | Status: AC
  Administered 2017-08-28: 20 mg via INTRAMUSCULAR
  Filled 2017-08-28: qty 20

## 2017-08-28 MED ORDER — ACETAMINOPHEN 325 MG PO TABS
650.0000 mg | ORAL_TABLET | Freq: Four times a day (QID) | ORAL | Status: DC | PRN
  Administered 2017-08-29 – 2017-08-30 (×4): 650 mg via ORAL
  Filled 2017-08-28 (×4): qty 2

## 2017-08-28 MED ORDER — STERILE WATER FOR INJECTION IJ SOLN
INTRAMUSCULAR | Status: AC
  Administered 2017-08-28: 10:00:00
  Filled 2017-08-28: qty 10

## 2017-08-28 MED ORDER — OLANZAPINE 5 MG PO TBDP: 5.0000 mg | ORAL_TABLET | Freq: Every day | ORAL | Status: DC

## 2017-08-28 MED ORDER — LORAZEPAM 1 MG PO TABS
2.0000 mg | ORAL_TABLET | Freq: Once | ORAL | Status: AC
  Administered 2017-08-28: 2 mg via ORAL
  Filled 2017-08-28: qty 2

## 2017-08-28 MED ORDER — LORAZEPAM 1 MG PO TABS
1.0000 mg | ORAL_TABLET | Freq: Once | ORAL | Status: AC
  Administered 2017-08-28: 1 mg via ORAL
  Filled 2017-08-28: qty 1

## 2017-08-28 MED ORDER — CHLORPROMAZINE HCL 25 MG PO TABS
25.0000 mg | ORAL_TABLET | Freq: Three times a day (TID) | ORAL | Status: DC | PRN
  Administered 2017-08-28 – 2017-08-30 (×4): 25 mg via ORAL
  Filled 2017-08-28 (×4): qty 1

## 2017-08-28 MED ORDER — ACETAMINOPHEN 325 MG PO TABS
650.0000 mg | ORAL_TABLET | Freq: Once | ORAL | Status: AC
  Administered 2017-08-28: 650 mg via ORAL
  Filled 2017-08-28: qty 2

## 2017-08-28 MED ORDER — DIPHENHYDRAMINE HCL 25 MG PO CAPS
50.0000 mg | ORAL_CAPSULE | Freq: Once | ORAL | Status: AC
Start: 2017-08-28 — End: 2017-08-28
  Administered 2017-08-28: 50 mg via ORAL
  Filled 2017-08-28: qty 2

## 2017-08-28 NOTE — ED Notes (Signed)
Pt standing in doorway. Screaming she wants to use the restroom. She states she will not use the restroom because "S**t" is on the toilet. Environmental has cleaned the restroom. RN spoke to the Pt and was able to convince pt to use the restroom. Security called.

## 2017-08-28 NOTE — ED Notes (Signed)
Pt at nurses station asking for more food after eating all of her breakfast. Pt also requesting to go to the store. I explained to the patient she was not able to go to the store at this time d/t being hospitalized for treatment. PT understanding and returned to room to eat [place] sand.

## 2017-08-28 NOTE — ED Notes (Signed)
Pt being confrontational with staff at this time and refusing to stay in room. When asked to return to room pt screamed out "I got to shit" in hall way. I explained to pt she could either go to her room or the restroom closest to her room. Pt states "im not using that toilet it has shit on it". This rn checked the toilet and it was clean. Pt proceeded to use restroom.

## 2017-08-28 NOTE — ED Notes (Signed)
Pt screaming telling the sitters "you shut the fuck up". Pt yelling at security at this time. Asked the patient to return to her bed. PT not cooperating at this time.

## 2017-08-28 NOTE — ED Notes (Signed)
Pt asking to use phone again. This RN and the sitters explained to the pt that she has made her phone calls for today and we have explained this to the patient multiple times. PT became agitated and approached this RN screaming "so you saying fuck you to my momma? You saying fuck you to my momma cause you wont let me call her in the nursing home?" Pt asked to return to room multiple times

## 2017-08-28 NOTE — ED Notes (Signed)
Pt at desk yelling, "where are my $400 shears? I know somebody has them. And yu should be looking for them." RN explained to pt that she can not have shears here and that she did not have them with her when she arrived. Pt turned the trash cans in the hallway over, throwing trash on the floor. Pt states, "I'm looking for my things." Pt asked to stop and return to her room. Security on standby. Pt returned to her room.

## 2017-08-28 NOTE — ED Notes (Signed)
Pt willingly took medication for anxiety

## 2017-08-28 NOTE — ED Notes (Signed)
Pt screaming and knocking books off nurses station, knocking scanner off wow, and cursing in hall. When asked to return to room pt stood up on bed and stated "im going to jump on this bed if I want to". Pt sitting on the top of head of bed

## 2017-08-28 NOTE — Consult Note (Addendum)
  Medication Consult  Medication reviewed  Patient discharged from [facility] 08/08/17 on following medications Vistaril 25 mg Tid prn anxiety Seroquel 50 mg Bid and 150 mg Q hs for mood stability Trazodone 50 mg Q hs for insomnia  First EDP need EKG to assess QTC if QTC is okay can ad the Haldol/Benadryl combination listed below; but need to assess QTC prior to adding multiple antipsychotics on board.  Recommendation:   Stop Seroquel add Zyprexa Zydis 5 mg Q hs for major depression/mood stability/agitation. Add Haldol 10 mg with Benadryl 50 mg Q 8 hours for agitation >  (only if QTC okay).  Spoke to [employer] related to medication consult states that Dr. Juleen China is the attending EDP at this time and he will make MD aware of medication consult.    Shuvon B. Rankin, NP    Addendum:  Patient is allergic to Haldol.  Will still need to do EKG to assess QTC but instead of Haldol start Thorazine 20 mg Q 8 hours prn agitation   >  (only if QTC okay).    Shuvon B. Rankin, NP

## 2017-08-28 NOTE — ED Notes (Signed)
Pt made two five minute phone calls at nurse's desk
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
## 2017-08-28 NOTE — ED Notes (Signed)
Pt trying to walk to other side of Pod F. Pt asked to go back to her room. Security called to standby. Pt at desk asking for pens and pencils. Pt informed she can not have these items.

## 2017-08-28 NOTE — ED Notes (Signed)
Regular Diet Ordered for Dinner. 
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
## 2017-08-28 NOTE — ED Notes (Signed)
Pt at desk asking Erskine SquibbJane to print out a "big rainbow for my daughters project. I need like 9"

## 2017-08-28 NOTE — ED Notes (Signed)
Pt restraints removed at this time for pt to use restroom.

## 2017-08-28 NOTE — ED Notes (Signed)
Pt lunch at bedside 

## 2017-08-28 NOTE — ED Notes (Signed)
Pt continues to pace hallway in unit with sitter; pt is calm at this time-Monique,RN

## 2017-08-28 NOTE — ED Notes (Signed)
Pt calm and cooperative at this time and allowing vs to be obtained. This rn explained to the patient if she is able to remain calm and nonaggressive with the staff the restraints can remain off. Pt verbalized understanding at this time

## 2017-08-28 NOTE — ED Notes (Signed)
Pt in 4 point soft restraints and calm at this time

## 2017-08-28 NOTE — ED Provider Notes (Signed)
Called to bedside because patient acting out. On my arrival, a tech was mopping the floor where patient had thrown things down. She was in the bed with security around her and restraints being applied. Things had seemed to have deescalated at that point and I spoke with patient briefly. A couple minutes later nursing called back and I could hear patient screaming in the background. Geodon ordered.    Raeford RazorKohut, Breydan Shillingburg, MD 08/28/17 [phone]
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
## 2017-08-28 NOTE — ED Notes (Signed)
Pt standing at desk, yelling at nurse, "Why can't I have any books or magazines?" RN explained to pt that she was throwing items earlier and she can not have anything at this time. Pt states, "You shouldn't have let your parents raise you up to be racist." RN asked pt to return to her room. Pt returned to room. Security on stand by.

## 2017-08-28 NOTE — Progress Notes (Signed)
Disposition CSW called [facility] to review status. They are checking and will call back.  Timmothy EulerJean T. Kaylyn LimSutter, MSW, LCSWA Disposition Clinical Social Work [phone] (cell) [phone] (office)
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
## 2017-08-28 NOTE — Progress Notes (Addendum)
Pt accepted to  [facility] [place].  Dr.  Jennelle HumanBelinda Veser is the attending provider.  Call report to [phone]  [facility]  Pt is [facility]C.  Pt may be transported by [employer]   Addendum: Pt needed to be given  geodone and put in soft restraints earlier today.   [facility] after she has been out of restraints for 24 hours.   [facility]17 AM.    Disposition CSW will continue to follow for placement.  Timmothy EulerJean T. Kaylyn LimSutter, MSW, LCSWA Disposition Clinical Social Work [phone] (cell) [phone] (office)

## 2017-08-28 NOTE — ED Notes (Signed)
Pt asking to use the phone for the second time this morning and reports "I need to call my bank because they keep charging my card" I explained to the pt this is her second and last phone call today if she chooses to use it now that is up to her. Pt on phone at nurses station.

## 2017-08-29 MED ORDER — QUETIAPINE FUMARATE 50 MG PO TABS
50.0000 mg | ORAL_TABLET | Freq: Two times a day (BID) | ORAL | Status: DC
  Administered 2017-08-29 – 2017-08-30 (×3): 50 mg via ORAL
  Filled 2017-08-29 (×3): qty 1

## 2017-08-29 NOTE — ED Notes (Signed)
Patient advised  That she must remain in her room.Patient became irritate [employer] office advised patient to return to her room. Patient states, that white man is not going to make me do anything. Patient then begin to walk back to the room. Patient started yelling and screaming from room. Engaging other patients. Patient redirected patient now in room at this time standing in the doorway.

## 2017-08-29 NOTE — ED Notes (Signed)
Patient redirected to room. Patient became loud. Yelling about her belongings and how she wanted to leave. Patient reports, " I dont care about no IVC" I will still leave.

## 2017-08-29 NOTE — ED Notes (Signed)
Md spoke with patient. 

## 2017-08-29 NOTE — ED Notes (Signed)
Patient ambulated to restroom.

## 2017-08-29 NOTE — ED Notes (Signed)
Patient breakfast at bedside.  

## 2017-08-29 NOTE — Progress Notes (Signed)
CSW [facility] at [facility]. CSW will notify ED RN and Jamesetta SoPhyllis, admission's staff, [facility].   Wells GuilesSarah Tache Bobst, LCSW, LCAS Disposition CSW [facility] [phone]

## 2017-08-29 NOTE — ED Notes (Signed)
[facility]

## 2017-08-29 NOTE — ED Notes (Signed)
Lunch at bedside 

## 2017-08-29 NOTE — Progress Notes (Signed)
[facility] - admissions) and assessed that, due to Pts level of acuity, they will not be able to accept her into services. Jesse SansJanee, [facility] was notified. CSW will [facility] to [facility].   Wells GuilesSarah Aidan Moten, LCSW, LCAS Disposition CSW [facility] [phone]

## 2017-08-29 NOTE — ED Notes (Signed)
C/o pain from head to toe states its her fibromyalgia. Sitting in chair talking to sitter

## 2017-08-29 NOTE — ED Notes (Signed)
MD aware patient would like to speak with a provider.

## 2017-08-29 NOTE — ED Notes (Signed)
Spoke with SW about patient BED at inpatient facility. Reports will call the facility.

## 2017-08-29 NOTE — ED Provider Notes (Signed)
Patient awake alert, ambulatory. She has questions about why she is involuntarily committed, as well as while she is at this facility rather than it [facility]. We tried to address the latter concern in particular with the patient, but she was not satisfied. Patient is awaiting inpatient bed placement. She was provided appropriate medication, PRN Ativan, which she requested.   Gerhard MunchLockwood, Daritza Brees, MD 08/29/17 1259

## 2017-08-29 NOTE — ED Notes (Signed)
Patient continues to need redirection./

## 2017-08-30 ENCOUNTER — Other Ambulatory Visit: Payer: Self-pay

## 2017-08-30 MED ORDER — ACETAMINOPHEN 325 MG PO TABS
650.0000 mg | ORAL_TABLET | Freq: Once | ORAL | Status: DC
  Filled 2017-08-30: qty 2

## 2017-08-30 MED ORDER — ZIPRASIDONE MESYLATE 20 MG IM SOLR
20.0000 mg | Freq: Once | INTRAMUSCULAR | Status: AC
  Administered 2017-08-30: 20 mg via INTRAMUSCULAR
  Filled 2017-08-30: qty 20

## 2017-08-30 MED ORDER — LORAZEPAM 2 MG/ML IJ SOLN
2.0000 mg | Freq: Once | INTRAMUSCULAR | Status: AC
  Administered 2017-08-30: 2 mg via INTRAMUSCULAR
  Filled 2017-08-30: qty 1

## 2017-08-30 NOTE — ED Notes (Signed)
This RN took over pt's care; security and [employer] at bedside. Pt very agitated, yelling at sitter and officers, attempting to slam the door in the room. Consulting civil engineerCharge RN at bedside. Dr.Rancour gave verbal orders for Geodon and Ativan IM. Pt willingly [facility] give injections. Pt now c/o nausea, is cooperative and redirectable.

## 2017-08-30 NOTE — ED Notes (Signed)
Deputy called and advised should be arriving in approx 30 min.

## 2017-08-30 NOTE — ED Notes (Signed)
Breakfast tray orderd

## 2017-08-30 NOTE — ED Notes (Signed)
Pt given Atarax as requested.

## 2017-08-30 NOTE — BH Assessment (Signed)
[facility] Assessment Progress Note     TTS Reassessment: Patient was seen today and states: "I feel the best that I have felt since I have been here. The medications that you have been giving me are really helping."  However, patient states that she continues to have some suicidal thoughts and states that she is hearing "loud thoughts."  Patient states that her chronic pain issues from her fibromyalgia are the trigger for her acting out behaviors and having negative thoughts.  Patient states that she is being treated with Tylenol for her pain and it is not helping.  Patient states that she still feels like she needs to be in the hospital for her mental health issues.  TTS will continue to seek placement for her.

## 2017-08-30 NOTE — ED Notes (Signed)
Pt noted to be standing in doorway of room. Pt wearing burgundy scrubs, hair band, and black back brace. Pt asked RN to come to her room. Pt sat on bed - states was taking antibiotic for UTI and now needs med for yeast infection. Pt also requesting Lidocaine patches and Voltaren Gel for chronic Fibromyalgia pain. Also pt requesting additional psych meds - states was given "all my meds last night soI slept well but I may become agitated today and I don't want to get agitated". Advised pt will advise EDP and NP, [facility]. Pt voiced appreciation. Pt continues to stand in doorway of room. Pt has crayons, paper, book, and pencil at bedside.

## 2017-08-30 NOTE — ED Notes (Signed)
Pt given 2nd cup of coffee as requested - advised may have water until snack time - voiced understanding.

## 2017-08-30 NOTE — ED Notes (Signed)
Pt continues to appear restless. Pt ambulates from room to nurses' desk and bathroom. Pt noted to be calm, cooperative. Pt given puzzle as requested.

## 2017-08-30 NOTE — ED Notes (Signed)
Re-TTS being performed.  

## 2017-08-30 NOTE — ED Notes (Signed)
Left 2nd message for deputy advising of need for transport.

## 2017-08-30 NOTE — Progress Notes (Signed)
Patient meets criteria for inpatient treatment.[facility] CSW faxed referrals to the following facilities for review. [facility], [facility], [facility], [facility], [facility], [facility], [facility], [address], [facility], [facility], [facility], [facility], [facility].   TTS will continue to seek bed placement.   Moss McKy-sha Sanyiah Kanzler, MSW, LCSW-A, LCAS-A 08/30/2017 10:18 AM

## 2017-08-30 NOTE — ED Notes (Signed)
Pt in the shower; new scrubs and underwear provided.

## 2017-08-30 NOTE — ED Notes (Signed)
Pt has been accepted [facility] - [phone]. Attempted to call report - was advised RN will return call.

## 2017-08-30 NOTE — ED Notes (Signed)
Pt requesting to journal; paper and a pencil provided. Pt calm and cooperative, also turning lights on by using her nails. Crayons provided per request.

## 2017-08-30 NOTE — ED Notes (Signed)
Pt on phone at nurses' desk talking w/her sister.  

## 2018-11-20 ENCOUNTER — Encounter (HOSPITAL_COMMUNITY): Payer: Self-pay

## 2018-11-20 ENCOUNTER — Inpatient Hospital Stay (HOSPITAL_COMMUNITY)
Admission: RE | Admit: 2018-11-20 | Discharge: 2018-11-25 | DRG: 885 | Disposition: A | Discharge status: Home / Self Care | Payer: Medicare HMO | Attending: Psychiatry | Admitting: Psychiatry

## 2018-11-20 ENCOUNTER — Other Ambulatory Visit: Payer: Self-pay

## 2018-11-20 DIAGNOSIS — Z87891 Personal history of nicotine dependence: Secondary | ICD-10-CM | POA: Diagnosis not present

## 2018-11-20 DIAGNOSIS — G8929 Other chronic pain: Secondary | ICD-10-CM | POA: Diagnosis not present

## 2018-11-20 DIAGNOSIS — I10 Essential (primary) hypertension: Secondary | ICD-10-CM | POA: Diagnosis present

## 2018-11-20 DIAGNOSIS — G4700 Insomnia, unspecified: Secondary | ICD-10-CM | POA: Diagnosis not present

## 2018-11-20 DIAGNOSIS — Z1159 Encounter for screening for other viral diseases: Secondary | ICD-10-CM

## 2018-11-20 DIAGNOSIS — F319 Bipolar disorder, unspecified: Secondary | ICD-10-CM | POA: Diagnosis present

## 2018-11-20 DIAGNOSIS — K219 Gastro-esophageal reflux disease without esophagitis: Secondary | ICD-10-CM | POA: Diagnosis not present

## 2018-11-20 DIAGNOSIS — R45850 Homicidal ideations: Secondary | ICD-10-CM | POA: Diagnosis not present

## 2018-11-20 DIAGNOSIS — F250 Schizoaffective disorder, bipolar type: Principal | ICD-10-CM | POA: Diagnosis present

## 2018-11-20 DIAGNOSIS — R45851 Suicidal ideations: Secondary | ICD-10-CM | POA: Diagnosis present

## 2018-11-20 DIAGNOSIS — M797 Fibromyalgia: Secondary | ICD-10-CM | POA: Diagnosis not present

## 2018-11-20 LAB — SARS CORONAVIRUS 2 BY RT PCR (HOSPITAL ORDER, PERFORMED IN ~~LOC~~ HOSPITAL LAB): SARS Coronavirus 2: NEGATIVE

## 2018-11-20 LAB — LIPID PANEL
Cholesterol: 150 mg/dL (ref 0–200)
HDL: 62 mg/dL (ref >40)
LDL Cholesterol: 78 mg/dL (ref 0–99)
Total CHOL/HDL Ratio: 2.4 RATIO
Triglycerides: 50 mg/dL (ref <150)
VLDL: 10 mg/dL (ref 0–40)

## 2018-11-20 LAB — CBC
HCT: 40.2 % (ref 36.0–46.0)
Hemoglobin: 12.7 g/dL (ref 12.0–15.0)
MCH: 30.2 pg (ref 26.0–34.0)
MCHC: 31.6 g/dL (ref 30.0–36.0)
MCV: 95.7 fL (ref 80.0–100.0)
Platelets: 150 10*3/uL (ref 150–400)
RBC: 4.2 MIL/uL (ref 3.87–5.11)
RDW: 14.6 % (ref 11.5–15.5)
WBC: 9 10*3/uL (ref 4.0–10.5)
nRBC: 0 % (ref 0.0–0.2)

## 2018-11-20 LAB — COMPREHENSIVE METABOLIC PANEL
ALT: 16 U/L (ref 0–44)
AST: 21 U/L (ref 15–41)
Albumin: 4.2 g/dL (ref 3.5–5.0)
Alkaline Phosphatase: 98 U/L (ref 38–126)
Anion gap: 10 (ref 5–15)
BUN: 18 mg/dL (ref 8–23)
CO2: 28 mmol/L (ref 22–32)
Calcium: 9.3 mg/dL (ref 8.9–10.3)
Chloride: 102 mmol/L (ref 98–111)
Creatinine, Ser: 1.07 mg/dL — ABNORMAL HIGH (ref 0.44–1.00)
GFR calc Af Amer: >60 mL/min (ref >60)
GFR calc non Af Amer: 56 mL/min — ABNORMAL LOW (ref >60)
Glucose, Bld: 78 mg/dL (ref 70–99)
Potassium: 4 mmol/L (ref 3.5–5.1)
Sodium: 140 mmol/L (ref 135–145)
Total Bilirubin: 0.3 mg/dL (ref 0.3–1.2)
Total Protein: 8 g/dL (ref 6.5–8.1)

## 2018-11-20 LAB — HEMOGLOBIN A1C
Hgb A1c MFr Bld: 5.8 % — ABNORMAL HIGH (ref 4.8–5.6)
Mean Plasma Glucose: 119.76 mg/dL

## 2018-11-20 LAB — TSH: TSH: 0.361 u[IU]/mL (ref 0.350–4.500)

## 2018-11-20 MED ORDER — HYDROXYZINE HCL 25 MG PO TABS
25.0000 mg | ORAL_TABLET | Freq: Three times a day (TID) | ORAL | Status: DC | PRN
  Administered 2018-11-20 – 2018-11-22 (×2): 25 mg via ORAL
  Filled 2018-11-20 (×2): qty 1

## 2018-11-20 MED ORDER — DIPHENHYDRAMINE HCL 50 MG/ML IJ SOLN: 50.0000 mg | Freq: Four times a day (QID) | INTRAMUSCULAR | Status: DC | PRN

## 2018-11-20 MED ORDER — MAGNESIUM HYDROXIDE 400 MG/5ML PO SUSP: 30.0000 mL | Freq: Every day | ORAL | Status: DC | PRN

## 2018-11-20 MED ORDER — DIPHENHYDRAMINE HCL 25 MG PO CAPS
50.0000 mg | ORAL_CAPSULE | Freq: Four times a day (QID) | ORAL | Status: DC | PRN
  Administered 2018-11-20: 50 mg via ORAL
  Filled 2018-11-20: qty 2

## 2018-11-20 MED ORDER — LORAZEPAM 2 MG/ML IJ SOLN: 1.0000 mg | Freq: Four times a day (QID) | INTRAMUSCULAR | Status: DC | PRN

## 2018-11-20 MED ORDER — ACETAMINOPHEN 325 MG PO TABS: 650.0000 mg | ORAL_TABLET | Freq: Four times a day (QID) | ORAL | Status: DC | PRN

## 2018-11-20 MED ORDER — ZIPRASIDONE HCL 20 MG PO CAPS
20.0000 mg | ORAL_CAPSULE | Freq: Two times a day (BID) | ORAL | Status: DC | PRN
  Administered 2018-11-22: 20 mg via ORAL
  Filled 2018-11-20: qty 1

## 2018-11-20 MED ORDER — ALUM & MAG HYDROXIDE-SIMETH 200-200-20 MG/5ML PO SUSP
30.0000 mL | ORAL | Status: DC | PRN
  Administered 2018-11-24 – 2018-11-25 (×2): 30 mL via ORAL
  Filled 2018-11-20 (×2): qty 30

## 2018-11-20 MED ORDER — LORAZEPAM 1 MG PO TABS
1.0000 mg | ORAL_TABLET | Freq: Four times a day (QID) | ORAL | Status: DC | PRN
  Administered 2018-11-20 – 2018-11-24 (×5): 1 mg via ORAL
  Filled 2018-11-20 (×5): qty 1

## 2018-11-20 MED ORDER — ZIPRASIDONE MESYLATE 20 MG IM SOLR: 20.0000 mg | Freq: Two times a day (BID) | INTRAMUSCULAR | Status: DC | PRN

## 2018-11-20 MED ORDER — TRAZODONE HCL 50 MG PO TABS
50.0000 mg | ORAL_TABLET | Freq: Every evening | ORAL | Status: DC | PRN
  Administered 2018-11-20: 50 mg via ORAL
  Filled 2018-11-20 (×2): qty 1
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
## 2018-11-20 NOTE — Progress Notes (Signed)
Patient ID: Pamela Graham, female   DOB: 1956/02/10, 63 y.o.   MRN: [id number] D: Patient sitting in dayroom with head down refusing to talk to writer. Pt upset about been on this hall. Medication given by previous shift. Later in the evening pt asked and was given a meal. Pt refused her sleep medication. Denies  SI/HI/AVH and pain. A: Support and encouragement offered as needed to express needs. Medications offered but refused.  R: Patient is safe and cooperative on unit now. Will continue to monitor  for safety and stability.

## 2018-11-20 NOTE — Progress Notes (Signed)
Patient ID: Pamela Graham, female   DOB: [birth year], 63 y.o.   MRN: [id number] Patient admitted to the unit due to increased agitation and HI toward family members.  Patient is irritable and agitated upon assessment.  Patient refused to sign any paperwork, but did engage in assessment.  Skin assessment complete and patient found to be free of injury and contraband.  Patient admitted to the unit and needed support to get her room due to agitation.
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
## 2018-11-21 LAB — URINALYSIS, ROUTINE W REFLEX MICROSCOPIC
Bilirubin Urine: NEGATIVE
Glucose, UA: NEGATIVE mg/dL
Hgb urine dipstick: NEGATIVE
Ketones, ur: NEGATIVE mg/dL
Leukocytes,Ua: NEGATIVE
Nitrite: NEGATIVE
Protein, ur: NEGATIVE mg/dL
Specific Gravity, Urine: 1.018 (ref 1.005–1.030)
pH: 6 (ref 5.0–8.0)

## 2018-11-21 LAB — RAPID URINE DRUG SCREEN, HOSP PERFORMED
Amphetamines: NOT DETECTED
Barbiturates: NOT DETECTED
Benzodiazepines: NOT DETECTED
Cocaine: NOT DETECTED
Opiates: NOT DETECTED
Tetrahydrocannabinol: POSITIVE — AB

## 2018-11-21 LAB — VALPROIC ACID LEVEL: Valproic Acid Lvl: 30 ug/mL — ABNORMAL LOW (ref 50.0–100.0)

## 2018-11-21 MED ORDER — HYDROCHLOROTHIAZIDE 25 MG PO TABS: 25.0000 mg | ORAL_TABLET | Freq: Every day | ORAL | Status: DC

## 2018-11-21 MED ORDER — QUETIAPINE FUMARATE 200 MG PO TABS
200.0000 mg | ORAL_TABLET | Freq: Every day | ORAL | Status: DC
  Administered 2018-11-21: 200 mg via ORAL
  Filled 2018-11-21 (×3): qty 1

## 2018-11-21 MED ORDER — QUETIAPINE FUMARATE 100 MG PO TABS
100.0000 mg | ORAL_TABLET | Freq: Every day | ORAL | Status: DC
  Filled 2018-11-21 (×2): qty 1

## 2018-11-21 MED ORDER — CLONAZEPAM 0.5 MG PO TABS
0.5000 mg | ORAL_TABLET | Freq: Two times a day (BID) | ORAL | Status: DC
  Administered 2018-11-21 – 2018-11-22 (×3): 0.5 mg via ORAL
  Filled 2018-11-21 (×3): qty 1

## 2018-11-21 MED ORDER — QUETIAPINE FUMARATE 50 MG PO TABS
50.0000 mg | ORAL_TABLET | Freq: Two times a day (BID) | ORAL | Status: DC
  Administered 2018-11-21 – 2018-11-22 (×3): 50 mg via ORAL
  Filled 2018-11-21 (×6): qty 1

## 2018-11-21 MED ORDER — OXCARBAZEPINE 150 MG PO TABS
150.0000 mg | ORAL_TABLET | Freq: Two times a day (BID) | ORAL | Status: DC
  Administered 2018-11-21 – 2018-11-22 (×3): 150 mg via ORAL
  Filled 2018-11-21 (×6): qty 1

## 2018-11-21 MED ORDER — HYDROCHLOROTHIAZIDE 25 MG PO TABS
25.0000 mg | ORAL_TABLET | Freq: Every day | ORAL | Status: DC
  Administered 2018-11-21 – 2018-11-25 (×5): 25 mg via ORAL
  Filled 2018-11-21 (×8): qty 1

## 2018-11-21 NOTE — Progress Notes (Signed)
Patient ID: Pamela Graham, female   DOB: July 21, 1955, 63 y.o.   MRN: [id number] Specimen cup placed in bathroom. Pt notified

## 2018-11-21 NOTE — [facility] Group Notes (Signed)
[place] LCSW Group Therapy Note  11/21/2018  11:30AM-12:00PM  Type of Therapy and Topic:  Group Therapy:  Being Your Own Hero  Participation Level:  Active   Description of Group:  Patients in this group were introduced to the concept that self-support is an essential part of recovery.   A song entitled "My Own Hero" was played and a group discussion ensued in which patients stated they could relate to the song and it inspired them to realize they have be willing to help themselves in order to succeed.  Therapeutic Goals: 1)  highlight the importance of taking care of one's own needs 2) discuss how people in our lives can disappoint [name], but as our own heroes we cannot get stuck focusing on just that disappointment 3) develop an attitude of hopefulness that we are already being our own hero in many ways    Summary of Patient Progress:  The patient expressed that she stands up for herself by reacting in anger toward other people.  She is quite angry about the circumstances that led to her hospitalization.  She told the group about the events in some detail.  They were supportive which she did seem to appreciate.   Therapeutic Modalities:   Motivational Interviewing Activity  Maretta Los, MSW, LCSW

## 2018-11-21 NOTE — Progress Notes (Signed)
Writer spoke with patient 1:12 and she reports that she has not had a good day d/t her fibromyalgia pain. She also reports not sleeping well last night. Writer informed her of the increase in her Seroquel so maybe she will rest tonight. She was given 3 heat packs for her back when she lays down. Support given and safety maintained on unit with 15 min checks.
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
## 2018-11-21 NOTE — Progress Notes (Signed)
[place] NOVEL CORONAVIRUS (COVID-19) DAILY CHECK-OFF SYMPTOMS - answer yes or no to each - every day NO YES  Have you had a fever in the past 24 hours?  . Fever (Temp > 37.80C / 100F) X   Have you had any of these symptoms in the past 24 hours? . New Cough .  Sore Throat  .  Shortness of Breath .  Difficulty Breathing .  Unexplained Body Aches   X   Have you had any one of these symptoms in the past 24 hours not related to allergies?   . Runny Nose .  Nasal Congestion .  Sneezing   X   If you have had runny nose, nasal congestion, sneezing in the past 24 hours, has it worsened?  X   EXPOSURES - check yes or no X   Have you traveled outside the state in the past 14 days?  X   Have you been in contact with someone with a confirmed diagnosis of COVID-19 or PUI in the past 14 days without wearing appropriate PPE?  X   Have you been living in the same home as a person with confirmed diagnosis of COVID-19 or a PUI (household contact)?    X   Have you been diagnosed with COVID-19?    X              What to do next: Answered NO to all: Answered YES to anything:   Proceed with unit schedule Follow the [facility] Inpatient Flowsheet.   
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
## 2018-11-21 NOTE — Progress Notes (Signed)
D. Pt presents with an anxious affect/ mood- calm, cooperative and mildly irritatble at times. Pt observed in the milieu, interacting appropriately with peers and staff.  Pt currently denies SI/HI and AVH  A. Labs and vitals monitored. Pt compliant withmedications. Pt supported emotionally and encouraged to express concerns and ask questions.  Pt provided with colored markers and paper per pt's request  R. Pt remains safe with 15 minute checks. Will continue POC.

## 2018-11-22 DIAGNOSIS — M797 Fibromyalgia: Secondary | ICD-10-CM

## 2018-11-22 DIAGNOSIS — F319 Bipolar disorder, unspecified: Secondary | ICD-10-CM

## 2018-11-22 DIAGNOSIS — G8929 Other chronic pain: Secondary | ICD-10-CM

## 2018-11-22 MED ORDER — QUETIAPINE FUMARATE 300 MG PO TABS
300.0000 mg | ORAL_TABLET | Freq: Every day | ORAL | Status: DC
  Administered 2018-11-22: 300 mg via ORAL
  Filled 2018-11-22 (×2): qty 1

## 2018-11-22 MED ORDER — CYCLOBENZAPRINE HCL 5 MG PO TABS
5.0000 mg | ORAL_TABLET | Freq: Three times a day (TID) | ORAL | Status: DC
  Administered 2018-11-22 – 2018-11-25 (×10): 5 mg via ORAL
  Filled 2018-11-22 (×17): qty 1

## 2018-11-22 MED ORDER — AMLODIPINE BESYLATE 5 MG PO TABS
5.0000 mg | ORAL_TABLET | Freq: Every day | ORAL | Status: DC
  Administered 2018-11-22 – 2018-11-23 (×2): 5 mg via ORAL
  Filled 2018-11-22 (×4): qty 1

## 2018-11-22 MED ORDER — OXCARBAZEPINE 150 MG PO TABS
150.0000 mg | ORAL_TABLET | Freq: Every day | ORAL | Status: DC
  Filled 2018-11-22: qty 1

## 2018-11-22 MED ORDER — OXCARBAZEPINE 150 MG PO TABS
75.0000 mg | ORAL_TABLET | Freq: Every day | ORAL | Status: DC
  Administered 2018-11-23: 75 mg via ORAL
  Filled 2018-11-22 (×3): qty 0.5

## 2018-11-22 NOTE — Progress Notes (Signed)
[place] NOVEL CORONAVIRUS (COVID-19) DAILY CHECK-OFF SYMPTOMS - answer yes or no to each - every day NO YES  Have you had a fever in the past 24 hours?  . Fever (Temp > 37.80C / 100F) X   Have you had any of these symptoms in the past 24 hours? . New Cough .  Sore Throat  .  Shortness of Breath .  Difficulty Breathing .  Unexplained Body Aches   X   Have you had any one of these symptoms in the past 24 hours not related to allergies?   . Runny Nose .  Nasal Congestion .  Sneezing   X   If you have had runny nose, nasal congestion, sneezing in the past 24 hours, has it worsened?  X   EXPOSURES - check yes or no X   Have you traveled outside the state in the past 14 days?  X   Have you been in contact with someone with a confirmed diagnosis of COVID-19 or PUI in the past 14 days without wearing appropriate PPE?  X   Have you been living in the same home as a person with confirmed diagnosis of COVID-19 or a PUI (household contact)?    X   Have you been diagnosed with COVID-19?    X              What to do next: Answered NO to all: Answered YES to anything:   Proceed with unit schedule Follow the [facility] Inpatient Flowsheet.   

## 2018-11-22 NOTE — Progress Notes (Signed)
D. Pt presents with a flat affect/ anxious mood- calm and cooperative behavior, with no behavioral issues noted - observed OOB in the milieu for much of the shift. Pt does report relief from her musculoskeletal pain since having had Flexeril earlier today .Pt currently denies SI/HI and AVH  A. Labs and vitals monitored. Pt compliant with medications. Pt supported emotionally and encouraged to express concerns and ask questions.   R. Pt remains safe with 15 minute checks. Will continue POC.
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
## 2018-11-22 NOTE — [facility] Group Notes (Signed)
[facility] LCSW Group Therapy Note  Date/Time:  11/22/2018  11:00AM-12:00PM  Type of Therapy and Topic:  Group Therapy:  Music and Mood  Participation Level:  Active   Description of Group: In this process group, members listened to a variety of genres of music and identified that different types of music evoke different responses.  Patients were encouraged to identify music that was soothing for them and music that was energizing for them.  Patients discussed how this knowledge can help with wellness and recovery in various ways including managing depression and anxiety as well as encouraging healthy sleep habits.    Therapeutic Goals: 1. Patients will explore the impact of different varieties of music on mood 2. Patients will verbalize the thoughts they have when listening to different types of music 3. Patients will identify music that is soothing to them as well as music that is energizing to them 4. Patients will discuss how to use this knowledge to assist in maintaining wellness and recovery 5. Patients will explore the use of music as a coping skill  Summary of Patient Progress:  At the beginning of group, patient expressed that she felt "defeated, overwhelmed, in misery, shocked."  When CSW said this sounded like she was depressed, she agreed and rated her depression at a 10 out of 10.  At the end of group she stated she felt a lot less depressed, even encouraged, but did not put a number on it.  Therapeutic Modalities: Solution Focused Brief Therapy Activity   Selmer Dominion, LCSW

## 2018-11-23 MED ORDER — PERPHENAZINE 8 MG PO TABS
8.0000 mg | ORAL_TABLET | Freq: Every day | ORAL | Status: DC
  Administered 2018-11-23 – 2018-11-24 (×2): 8 mg via ORAL
  Filled 2018-11-23 (×4): qty 1

## 2018-11-23 MED ORDER — CLONAZEPAM 0.5 MG PO TABS
0.2500 mg | ORAL_TABLET | Freq: Two times a day (BID) | ORAL | Status: DC
  Administered 2018-11-23 – 2018-11-25 (×4): 0.25 mg via ORAL
  Filled 2018-11-23 (×3): qty 1

## 2018-11-23 MED ORDER — CARBAMAZEPINE 100 MG PO CHEW
100.0000 mg | CHEWABLE_TABLET | Freq: Two times a day (BID) | ORAL | Status: DC
  Administered 2018-11-23 – 2018-11-25 (×5): 100 mg via ORAL
  Filled 2018-11-23 (×10): qty 1

## 2018-11-23 MED ORDER — PERPHENAZINE 4 MG PO TABS
4.0000 mg | ORAL_TABLET | Freq: Every day | ORAL | Status: DC
  Administered 2018-11-23 – 2018-11-25 (×3): 4 mg via ORAL
  Filled 2018-11-23 (×5): qty 1

## 2018-11-23 MED ORDER — TEMAZEPAM 30 MG PO CAPS
30.0000 mg | ORAL_CAPSULE | Freq: Every day | ORAL | Status: DC
  Administered 2018-11-23 – 2018-11-24 (×2): 30 mg via ORAL
  Filled 2018-11-23: qty 5
  Filled 2018-11-23: qty 4

## 2018-11-23 MED ORDER — AMLODIPINE BESYLATE 10 MG PO TABS
10.0000 mg | ORAL_TABLET | Freq: Every day | ORAL | Status: DC
  Administered 2018-11-24 – 2018-11-25 (×2): 10 mg via ORAL
  Filled 2018-11-23 (×4): qty 1

## 2018-11-23 NOTE — Progress Notes (Signed)
Nursing Progress Note: 7p-7a D: Pt currently presents with a sad/flat/depressed affect and behavior. Pt states "My day was fine. I wanted to get better understanding of the meds I've recently been put on." Interacting appropriately with the milieu. Pt reports good sleep during the previous night with current medication regimen.  A: Pt provided with medications per providers orders. Pt's labs and vitals were monitored throughout the night. Pt supported emotionally and encouraged to express concerns and questions. Pt educated on medications.  R: Pt's safety ensured with 15 minute and environmental checks. Pt currently denies SI, HI, and AVH. Pt verbally contracts to seek staff if SI,HI, or AVH occurs and to consult with staff before acting on any harmful thoughts. Will continue to monitor.   [place] NOVEL CORONAVIRUS (COVID-19) DAILY CHECK-OFF SYMPTOMS - answer yes or no to each - every day NO YES  Have you had a fever in the past 24 hours?  . Fever (Temp > 37.80C / 100F) X   Have you had any of these symptoms in the past 24 hours? . New Cough .  Sore Throat  .  Shortness of Breath .  Difficulty Breathing .  Unexplained Body Aches   X   Have you had any one of these symptoms in the past 24 hours not related to allergies?   . Runny Nose .  Nasal Congestion .  Sneezing   X   If you have had runny nose, nasal congestion, sneezing in the past 24 hours, has it worsened?  X   EXPOSURES - check yes or no X   Have you traveled outside the state in the past 14 days?  X   Have you been in contact with someone with a confirmed diagnosis of COVID-19 or PUI in the past 14 days without wearing appropriate PPE?  X   Have you been living in the same home as a person with confirmed diagnosis of COVID-19 or a PUI (household contact)?    X   Have you been diagnosed with COVID-19?    X              What to do next: Answered NO to all: Answered YES to anything:   Proceed with unit schedule Follow  the [facility] Inpatient Flowsheet.

## 2018-11-23 NOTE — Progress Notes (Signed)
Writer entered patient room and observed her lying in bed asleep. She woke when name was called. She reports having had a good day and writer encouraged her not to sleep so early in order to sleep tonight. She later came to dayroom and watched tv and ate snack. Safety maintained on unit with 15 min checks.

## 2018-11-23 NOTE — Progress Notes (Signed)
DAR NOTE: Patient presents with anxious affect and mood.  Denies suicidal thoughts, auditory and visual hallucinations.  Reports symptoms of cravings, nausea and agitation on self inventory form.  Rates depression at 7, hopelessness at 5, and anxiety at 5.  Maintained on routine safety checks.  Medications given as prescribed.  Support and encouragement offered as needed.  Attended group and participated.  Patient observed socializing with peers in the dayroom.  Offered no complaint.
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
## 2018-11-23 NOTE — Progress Notes (Signed)
Recreation Therapy Notes  Date: 11/23/2018 Time: 10:00 am Location: [address] hall   Group Topic: Stress Exploration  Goal Area(s) Addresses:  Patient will work on worksheet on Stress Exploration. Patient will follow directions on first prompt.  Behavioral Response: Appropriate  Intervention: Worksheet  Activity:  Staff on [address] hall were provided with a worksheet on Stress Exploration. Staff was instructed to give it to the patients and have them work on it in place of Recreation Therapy Group. Staff was also given 3 coloring sheets and were given the option to give them out.  Education:  Ability to follow Directions, Change of thought processes Discharge Planning.   Education Outcome: Acknowledges education/In group clarification offered  Clinical Observations/Feedback: . Due to COVID-19, guidelines group was not held. Group members were provided a learning activity worksheet to work on the topic and above-stated goals. LRT is available to answer any questions patient may have regarding the worksheet.  Raedyn Klinck L Neville Walston, LRT/CTRS         Miyu Fenderson L Leaira Fullam 11/23/2018 1:10 PM 

## 2018-11-23 NOTE — BHH Group Notes (Addendum)
[facility] LCSW Group Therapy Note  Date/Time: 11/23/2018 @ 1:30pm  Type of Therapy and Topic:  Group Therapy:  Overcoming Obstacles  Participation Level:  Active  Description of Group:    In this group patients will be encouraged to explore what they see as obstacles to their own wellness and recovery. They will be guided to discuss their thoughts, feelings, and behaviors related to these obstacles. The group will process together ways to cope with barriers, with attention given to specific choices patients can make. Each patient will be challenged to identify changes they are motivated to make in order to overcome their obstacles. This group will be process-oriented, with patients participating in exploration of their own experiences as well as giving and receiving support and challenge from other group members.  Therapeutic Goals: 1. Patient will identify personal and current obstacles as they relate to admission. 2. Patient will identify barriers that currently interfere with their wellness or overcoming obstacles.  3. Patient will identify feelings, thought process and behaviors related to these barriers. 4. Patient will identify two changes they are willing to make to overcome these obstacles:    Summary of Patient Progress   Pt was active and engaged in group therapy today. Pt was able to explore her own obstacles that are in the way of her own wellness and recovery. Pt reports that her obstacles are being homeless and being physically/mentally sick. Pt brainstormed ways to overcome her obstacles. Pt had the awareness to know that she needs to fix her basic needs of finding housing first. Pt discussed some housing options that she has looked into and helped fellow group members with their obstacles.    Therapeutic Modalities:   Cognitive Behavioral Therapy Solution Focused Therapy Motivational Interviewing Relapse Prevention Therapy   Ardelle Anton, LCSW

## 2018-11-23 NOTE — Tx Team (Signed)
Interdisciplinary Treatment and Diagnostic Plan Update  11/23/2018 Time of Session: 9:12am Pamela Graham MRN: [id number]  Principal Diagnosis: <principal problem not specified>  Secondary Diagnoses: Active Problems:   Bipolar 1 disorder (HCC)   Current Medications:  Current Facility-Administered Medications  Medication Dose Route Frequency Provider Last Rate Last Dose  . acetaminophen (TYLENOL) tablet 650 mg  650 mg Oral Q6H PRN Laveda AbbeParks, Laurie Britton, NP      . alum & mag hydroxide-simeth (MAALOX/MYLANTA) 200-200-20 MG/5ML suspension 30 mL  30 mL Oral Q4H PRN Laveda AbbeParks, Laurie Britton, NP      . Melene Muller[START ON 11/24/2018] amLODipine (NORVASC) tablet 10 mg  10 mg Oral Daily Malvin JohnsFarah, Brian, MD      . carbamazepine (TEGRETOL) chewable tablet 100 mg  100 mg Oral BID Malvin JohnsFarah, Brian, MD   100 mg at 11/23/18 [id number]  . clonazePAM (KLONOPIN) tablet 0.25 mg  0.25 mg Oral BID Malvin JohnsFarah, Brian, MD      . cyclobenzaprine (FLEXERIL) tablet 5 mg  5 mg Oral TID Antonieta Pertlary, Greg Lawson, MD   5 mg at 11/23/18 1155  . diphenhydrAMINE (BENADRYL) capsule 50 mg  50 mg Oral Q6H PRN Money, Gerlene Burdockravis B, FNP   50 mg at 11/20/18 1920   Or  . diphenhydrAMINE (BENADRYL) injection 50 mg  50 mg Intramuscular Q6H PRN Money, Feliz Beamravis B, FNP      . hydrochlorothiazide (HYDRODIURIL) tablet 25 mg  25 mg Oral Daily Antonieta Pertlary, Greg Lawson, MD   25 mg at 11/23/18 0827  . hydrOXYzine (ATARAX/VISTARIL) tablet 25 mg  25 mg Oral TID PRN Laveda AbbeParks, Laurie Britton, NP   25 mg at 11/22/18 0101  . LORazepam (ATIVAN) tablet 1 mg  1 mg Oral Q6H PRN Money, Gerlene Burdockravis B, FNP   1 mg at 11/22/18 2101   Or  . LORazepam (ATIVAN) injection 1 mg  1 mg Intramuscular Q6H PRN Money, Feliz Beamravis B, FNP      . magnesium hydroxide (MILK OF MAGNESIA) suspension 30 mL  30 mL Oral Daily PRN Laveda AbbeParks, Laurie Britton, NP      . perphenazine (TRILAFON) tablet 4 mg  4 mg Oral Daily Malvin JohnsFarah, Brian, MD   4 mg at 11/23/18 1155  . perphenazine (TRILAFON) tablet 8 mg  8 mg Oral QHS Malvin JohnsFarah, Brian, MD      .  temazepam (RESTORIL) capsule 30 mg  30 mg Oral QHS Malvin JohnsFarah, Brian, MD      . traZODone (DESYREL) tablet 50 mg  50 mg Oral QHS PRN Laveda AbbeParks, Laurie Britton, NP   50 mg at 11/20/18 2253  . ziprasidone (GEODON) capsule 20 mg  20 mg Oral Q12H PRN Money, Gerlene Burdockravis B, FNP   20 mg at 11/22/18 0100  . ziprasidone (GEODON) injection 20 mg  20 mg Intramuscular Q12H PRN Money, Gerlene Burdockravis B, FNP       PTA Medications: Medications Prior to Admission  Medication Sig Dispense Refill Last Dose  . clonazePAM (KLONOPIN) 0.5 MG tablet Take 1 tablet by mouth 3 (three) times daily.     . divalproex (DEPAKOTE ER) 500 MG 24 hr tablet Take 2 tablets by mouth at bedtime.     . DULoxetine (CYMBALTA) 60 MG capsule Take 1 capsule by mouth daily.     Marland Kitchen. gabapentin (NEURONTIN) 300 MG capsule Take 1 capsule by mouth 2 (two) times a day.     . hydrochlorothiazide (HYDRODIURIL) 12.5 MG tablet Take 1 tablet by mouth daily.     . hydrochlorothiazide (HYDRODIURIL) 25 MG tablet Take 1 tablet (25  mg total) by mouth daily. 30 tablet 0 08/26/2017 at Unknown time  . omeprazole (PRILOSEC) 40 MG capsule Take 1 capsule by mouth daily.     . QUEtiapine (SEROQUEL) 300 MG tablet Take 1 tablet by mouth at bedtime.       Patient Stressors:    Patient Strengths:    Treatment Modalities: Medication Management, Group therapy, Case management,  1 to 1 session with clinician, Psychoeducation, Recreational therapy.   Physician Treatment Plan for Primary Diagnosis: <principal problem not specified> Long Term Goal(s): Improvement in symptoms so as ready for discharge Improvement in symptoms so as ready for discharge   Short Term Goals: Ability to identify changes in lifestyle to reduce recurrence of condition will improve Ability to verbalize feelings will improve Ability to disclose and discuss suicidal ideas Ability to demonstrate self-control will improve Ability to identify and develop effective coping behaviors will improve Ability to maintain  clinical measurements within normal limits will improve Compliance with prescribed medications will improve Ability to identify changes in lifestyle to reduce recurrence of condition will improve Ability to verbalize feelings will improve Ability to disclose and discuss suicidal ideas Ability to demonstrate self-control will improve Ability to identify and develop effective coping behaviors will improve Ability to maintain clinical measurements within normal limits will improve Compliance with prescribed medications will improve  Medication Management: Evaluate patient's response, side effects, and tolerance of medication regimen.  Therapeutic Interventions: 1 to 1 sessions, Unit Group sessions and Medication administration.  Evaluation of Outcomes: Progressing  Physician Treatment Plan for Secondary Diagnosis: Active Problems:   Bipolar 1 disorder ([place])  Long Term Goal(s): Improvement in symptoms so as ready for discharge Improvement in symptoms so as ready for discharge   Short Term Goals: Ability to identify changes in lifestyle to reduce recurrence of condition will improve Ability to verbalize feelings will improve Ability to disclose and discuss suicidal ideas Ability to demonstrate self-control will improve Ability to identify and develop effective coping behaviors will improve Ability to maintain clinical measurements within normal limits will improve Compliance with prescribed medications will improve Ability to identify changes in lifestyle to reduce recurrence of condition will improve Ability to verbalize feelings will improve Ability to disclose and discuss suicidal ideas Ability to demonstrate self-control will improve Ability to identify and develop effective coping behaviors will improve Ability to maintain clinical measurements within normal limits will improve Compliance with prescribed medications will improve     Medication Management: Evaluate patient's  response, side effects, and tolerance of medication regimen.  Therapeutic Interventions: 1 to 1 sessions, Unit Group sessions and Medication administration.  Evaluation of Outcomes: Progressing   RN Treatment Plan for Primary Diagnosis: <principal problem not specified> Long Term Goal(s): Knowledge of disease and therapeutic regimen to maintain health will improve  Short Term Goals: Ability to participate in decision making will improve, Ability to verbalize feelings will improve, Ability to disclose and discuss suicidal ideas, Ability to identify and develop effective coping behaviors will improve and Compliance with prescribed medications will improve  Medication Management: RN will administer medications as ordered by provider, will assess and evaluate patient's response and provide education to patient for prescribed medication. RN will report any adverse and/or side effects to prescribing provider.  Therapeutic Interventions: 1 on 1 counseling sessions, Psychoeducation, Medication administration, Evaluate responses to treatment, Monitor vital signs and CBGs as ordered, Perform/monitor CIWA, COWS, AIMS and Fall Risk screenings as ordered, Perform wound care treatments as ordered.  Evaluation of Outcomes: Progressing  LCSW Treatment Plan for Primary Diagnosis: <principal problem not specified> Long Term Goal(s): Safe transition to appropriate next level of care at discharge, Engage patient in therapeutic group addressing interpersonal concerns.  Short Term Goals: Engage patient in aftercare planning with referrals and resources and Increase skills for wellness and recovery  Therapeutic Interventions: Assess for all discharge needs, 1 to 1 time with Social worker, Explore available resources and support systems, Assess for adequacy in community support network, Educate family and significant other(s) on suicide prevention, Complete Psychosocial Assessment, Interpersonal group  therapy.  Evaluation of Outcomes: Progressing   Progress in Treatment: Attending groups: Yes. Participating in groups: Yes. Taking medication as prescribed: Yes. Toleration medication: Yes. Family/Significant other contact made: No, will contact:  will contact pt's granddaughter Patient understands diagnosis: Yes. Discussing patient identified problems/goals with staff: Yes. Medical problems stabilized or resolved: Yes. Denies suicidal/homicidal ideation: Yes. Issues/concerns per patient self-inventory: No. Other:   New problem(s) identified: No, Describe:  None  New Short Term/Long Term Goal(s):  Patient Goals:    Discharge Plan or Barriers:   Reason for Continuation of Hospitalization: Aggression Medical Issues Medication stabilization  Estimated Length of Stay: 3-5 days  Attendees: Patient: 11/23/2018   Physician: Dr. Malvin JohnsBrian Farah, MD 11/23/2018   Nursing: Lanora ManisElizabeth, RN 11/23/2018  RN Care Manager: 11/23/2018  Social Worker: Stephannie PetersJasmine Jona Zappone, LCSW 11/23/2018   Recreational Therapist:  11/23/2018   Other:  11/23/2018  Other:  11/23/2018   Other: 11/23/2018     Scribe for Treatment Team: Delphia GratesJasmine M Galilea Quito, LCSW 11/23/2018 2:40 PM

## 2018-11-24 MED ORDER — PRAZOSIN HCL 2 MG PO CAPS
4.0000 mg | ORAL_CAPSULE | Freq: Every day | ORAL | Status: DC
  Administered 2018-11-24: 4 mg via ORAL
  Filled 2018-11-24 (×3): qty 2

## 2018-11-24 NOTE — Progress Notes (Signed)
[facility]  11/24/2018 9:44 AM Darolyn RuaKim Riehle  MRN:  [id number] Subjective:   Patient is seen for cognitive therapy and med eval, she is alert and oriented fully there have been no disruptive behaviors, granddaughter confirms at the daughter the patient does indeed fabricate issues for commitment the patient self is making sure her meds are correct and making sure her Cymbalta dose is correct so forth probable discharge tomorrow on current medications However patient reports night terrors and we will try prazosin for that  Principal Problem: Bipolar type condition Diagnosis: Active Problems:   Bipolar 1 disorder (HCC)  Total Time spent with patient: 20 minutes    Past Medical History:  Past Medical History:  Diagnosis Date  . Barrett's esophagus   . DDD (degenerative disc disease), lumbar   . Fibromyalgia   . Gastric ulcer   . Hypertension   . Migraines     Past Surgical History:  Procedure Laterality Date  . ABDOMINAL HYSTERECTOMY    . COLONOSCOPY    . UPPER GI ENDOSCOPY     Family History: History reviewed. No pertinent family history. Family Psychiatric  History: no new data Social History:  Social History   Substance and Sexual Activity  Alcohol Use No  . Frequency: Never     Social History   Substance and Sexual Activity  Drug Use Yes  . Types: Marijuana   Comment: Last used: Today.     Social History   Socioeconomic History  . Marital status: Single    Spouse name: Not on file  . Number of children: Not on file  . Years of education: Not on file  . Highest education level: Not on file  Occupational History  . Not on file  Social Needs  . Financial resource strain: Not on file  . Food insecurity:    Worry: Not on file    Inability: Not on file  . Transportation needs:    Medical: Not on file    Non-medical: Not on file  Tobacco Use  . Smoking status: Former Games developermoker  . Smokeless tobacco: Never Used  Substance and Sexual Activity  .  Alcohol use: No    Frequency: Never  . Drug use: Yes    Types: Marijuana    Comment: Last used: Today.   Marland Kitchen. Sexual activity: Not Currently  Lifestyle  . Physical activity:    Days per week: Not on file    Minutes per session: Not on file  . Stress: Not on file  Relationships  . Social connections:    Talks on phone: Not on file    Gets together: Not on file    Attends religious service: Not on file    Active member of club or organization: Not on file    Attends meetings of clubs or organizations: Not on file    Relationship status: Not on file  Other Topics Concern  . Not on file  Social History Narrative  . Not on file   Additional Social History:    Pain Medications: see MAR Prescriptions: see MAR Over the Counter: see MAR History of alcohol / drug use?: No history of alcohol / drug abuse                    Sleep: Good  Appetite:  Good  Current Medications: Current Facility-Administered Medications  Medication Dose Route Frequency Provider Last Rate Last Dose  . acetaminophen (TYLENOL) tablet 650 mg  650 mg Oral Q6H  PRN Ethelene Hal, NP      . alum & mag hydroxide-simeth (MAALOX/MYLANTA) 200-200-20 MG/5ML suspension 30 mL  30 mL Oral Q4H PRN Ethelene Hal, NP      . amLODipine (NORVASC) tablet 10 mg  10 mg Oral Daily Johnn Hai, MD   10 mg at 11/24/18 0839  . carbamazepine (TEGRETOL) chewable tablet 100 mg  100 mg Oral BID Johnn Hai, MD   100 mg at 11/24/18 0839  . clonazePAM (KLONOPIN) tablet 0.25 mg  0.25 mg Oral BID Johnn Hai, MD   0.25 mg at 11/24/18 0839  . cyclobenzaprine (FLEXERIL) tablet 5 mg  5 mg Oral TID Sharma Covert, MD   5 mg at 11/24/18 0839  . diphenhydrAMINE (BENADRYL) capsule 50 mg  50 mg Oral Q6H PRN Money, Lowry Ram, FNP   50 mg at 11/20/18 1920   Or  . diphenhydrAMINE (BENADRYL) injection 50 mg  50 mg Intramuscular Q6H PRN Money, Darnelle Maffucci B, FNP      . hydrochlorothiazide (HYDRODIURIL) tablet 25 mg  25 mg Oral  Daily Sharma Covert, MD   25 mg at 11/24/18 0840  . hydrOXYzine (ATARAX/VISTARIL) tablet 25 mg  25 mg Oral TID PRN Ethelene Hal, NP   25 mg at 11/22/18 0101  . LORazepam (ATIVAN) tablet 1 mg  1 mg Oral Q6H PRN Money, Lowry Ram, FNP   1 mg at 11/23/18 2115   Or  . LORazepam (ATIVAN) injection 1 mg  1 mg Intramuscular Q6H PRN Money, Darnelle Maffucci B, FNP      . magnesium hydroxide (MILK OF MAGNESIA) suspension 30 mL  30 mL Oral Daily PRN Ethelene Hal, NP      . perphenazine (TRILAFON) tablet 4 mg  4 mg Oral Daily Johnn Hai, MD   4 mg at 11/24/18 0839  . perphenazine (TRILAFON) tablet 8 mg  8 mg Oral QHS Johnn Hai, MD   8 mg at 11/23/18 2115  . temazepam (RESTORIL) capsule 30 mg  30 mg Oral QHS Johnn Hai, MD   30 mg at 11/23/18 2114  . traZODone (DESYREL) tablet 50 mg  50 mg Oral QHS PRN Ethelene Hal, NP   50 mg at 11/20/18 2253  . ziprasidone (GEODON) capsule 20 mg  20 mg Oral Q12H PRN Money, Lowry Ram, FNP   20 mg at 11/22/18 0100  . ziprasidone (GEODON) injection 20 mg  20 mg Intramuscular Q12H PRN Money, Lowry Ram, FNP        Lab Results: No results found for this or any previous visit (from the past 48 hour(s)).  Blood Alcohol level:  Lab Results  Component Value Date   ETH <10 08/27/2017   ETH <10 95/62/1308    Metabolic Disorder Labs: Lab Results  Component Value Date   HGBA1C 5.8 (H) 11/20/2018   MPG 119.76 11/20/2018   MPG 111 08/10/2017   No results found for: PROLACTIN Lab Results  Component Value Date   CHOL 150 11/20/2018   TRIG 50 11/20/2018   HDL 62 11/20/2018   CHOLHDL 2.4 11/20/2018   VLDL 10 11/20/2018   LDLCALC 78 11/20/2018   LDLCALC 67 08/10/2017    Physical Findings: AIMS:  , ,  ,  ,    CIWA:    COWS:     Musculoskeletal: Strength & Muscle Tone: within normal limits Gait & Station: normal Patient leans: N/A  Psychiatric Specialty Exam: Physical Exam  ROS  Blood pressure (!) 127/94, pulse 92, temperature  97.7 F  (36.5 C), temperature source Oral, resp. rate 18, height 5\' 7"  (1.702 m), weight 115.2 kg, SpO2 100 %.Body mass index is 39.78 kg/m.  General Appearance: Casual  Eye Contact:  Good  Speech:  Clear and Coherent  Volume:  Decreased  Mood:  Euthymic  Affect:  Congruent  Thought Process:  Coherent and Descriptions of Associations: Intact  Orientation:  Full (Time, Place, and Person)  Thought Content:  Rumination  Suicidal Thoughts:  No  Homicidal Thoughts:  No  Memory:  Immediate;   Fair  Judgement:  Fair  Insight:  Fair  Psychomotor Activity:  Normal  Concentration:  Concentration: Fair  Recall:  FiservFair  Fund of Knowledge:  Fair  Language:  Fair  Akathisia:  Negative  Handed:  Right  AIMS (if indicated):     Assets:  Physical Health Resilience  ADL's:  Intact  Cognition:  WNL  Sleep:  Number of Hours: 6.25     Treatment Plan Summary: Daily contact with patient to assess and evaluate symptoms and progress in treatment, Medication management and Plan Continue Cymbalta and prazosin continue cognitive therapy prepare for discharge tomorrow  Malvin JohnsFARAH,Nyomi Howser, MD 11/24/2018, 9:44 AM

## 2018-11-24 NOTE — Progress Notes (Signed)
Patient ID: Pamela Graham, female   DOB: Dec 21, 1955, 63 y.o.   MRN: [id number]    CSW met with pt and gave her resources on available housing in [place] through the [employer]. CSW discussed housing options with the pt and discussed a possible discharge of the following day. Pt reports that her daughter called her stating that she can come back home and discussed if they would get along. Pt reports her daughter trying to play games. Pt reports wanting to get her money back from her daughter and if she has to go back to [place] then she will. CSW let the pt's ACT team know of her discharge date.

## 2018-11-24 NOTE — Progress Notes (Signed)
Nursing Progress Note: 7p-7a D: Pt currently presents with a sad/flat/depressed affect and behavior. Interacting appropriately with the milieu. Pt reports good sleep during the previous night with current medication regimen.  A: Pt provided with medications per providers orders. Pt's labs and vitals were monitored throughout the night. Pt supported emotionally and encouraged to express concerns and questions. Pt educated on medications.  R: Pt's safety ensured with 15 minute and environmental checks. Pt currently denies SI, HI, and AVH. Pt verbally contracts to seek staff if SI,HI, or AVH occurs and to consult with staff before acting on any harmful thoughts. Will continue to monitor.  [place] NOVEL CORONAVIRUS (COVID-19) DAILY CHECK-OFF SYMPTOMS - answer yes or no to each - every day NO YES  Have you had a fever in the past 24 hours?  . Fever (Temp > 37.80C / 100F) X   Have you had any of these symptoms in the past 24 hours? . New Cough .  Sore Throat  .  Shortness of Breath .  Difficulty Breathing .  Unexplained Body Aches   X   Have you had any one of these symptoms in the past 24 hours not related to allergies?   . Runny Nose .  Nasal Congestion .  Sneezing   X   If you have had runny nose, nasal congestion, sneezing in the past 24 hours, has it worsened?  X   EXPOSURES - check yes or no X   Have you traveled outside the state in the past 14 days?  X   Have you been in contact with someone with a confirmed diagnosis of COVID-19 or PUI in the past 14 days without wearing appropriate PPE?  X   Have you been living in the same home as a person with confirmed diagnosis of COVID-19 or a PUI (household contact)?    X   Have you been diagnosed with COVID-19?    X              What to do next: Answered NO to all: Answered YES to anything:   Proceed with unit schedule Follow the [facility] Inpatient Flowsheet.

## 2018-11-24 NOTE — Progress Notes (Signed)
Recreation Therapy Notes     Date: 11/24/2018 Time: 10:00 am Location: 500 hall   Group Topic: Life Goal Planning  Goal Area(s) Addresses:  Patient will work on worksheet on  Life Goal Planning. Patient will follow directions on first prompt.  Behavioral Response: Appropriate  Intervention: Worksheet  Activity:  Staff on [address] hall were provided with a worksheet on Life Goal Planning . Staff was instructed to give it to the patients and have them work on it in place of Recreation Therapy Group. Staff was also given 2 coloring sheets and 2 word searches and were given the option to give them out.  Education: Ability to follow Directions, Change of thought processes Discharge Planning.   Education Outcome: Acknowledges education/In group clarification offered  Clinical Observations/Feedback: . Due to COVID-19,guidelines group was not held.Group members were provided a learning activity worksheet to work on the topic and above-stated goals. LRT is available to answer any questions patient may have regarding the worksheet.  Colandra Ohanian L Jamyah Folk, LRT/CTRS      Nasire Reali L Lyndel Dancel 11/24/2018 2:43 PM 

## 2018-11-25 MED ORDER — PRAZOSIN HCL 2 MG PO CAPS: 4.0000 mg | ORAL_CAPSULE | Freq: Every day | ORAL | 2 refills | Status: DC

## 2018-11-25 MED ORDER — TEMAZEPAM 30 MG PO CAPS: 30.0000 mg | ORAL_CAPSULE | Freq: Every day | ORAL | 0 refills | Status: DC

## 2018-11-25 MED ORDER — CARBAMAZEPINE 100 MG PO CHEW: 100.0000 mg | CHEWABLE_TABLET | Freq: Two times a day (BID) | ORAL | 2 refills | Status: DC

## 2018-11-25 MED ORDER — CYCLOBENZAPRINE HCL 5 MG PO TABS: 5.0000 mg | ORAL_TABLET | Freq: Three times a day (TID) | ORAL | 2 refills | Status: DC

## 2018-11-25 MED ORDER — CLONAZEPAM 0.5 MG PO TABS: 0.2500 mg | ORAL_TABLET | Freq: Two times a day (BID) | ORAL | 0 refills | Status: DC

## 2018-11-25 MED ORDER — PERPHENAZINE 4 MG PO TABS: ORAL_TABLET | ORAL | 2 refills | Status: DC

## 2018-11-25 NOTE — Progress Notes (Signed)
Recreation Therapy Notes  INPATIENT RECREATION TR PLAN  Patient Details Name: Pamela Graham MRN: [id number] DOB: 1955/07/10 Today's Date: 11/25/2018  Rec Therapy Plan Is patient appropriate for Therapeutic Recreation?: Yes Treatment times per week: 3-5 times per week Estimated Length of Stay: 5-7 days  TR Treatment/Interventions: Group participation (Comment)  Discharge Criteria Pt will be discharged from therapy if:: Discharged Treatment plan/goals/alternatives discussed and agreed upon by:: Patient/family  Discharge Summary Short term goals set: see patient care plan Short term goals met: Adequate for discharge Progress toward goals comments: Groups attended Which groups?: Stress management, Goal setting, Other (Comment)(Labelling Triggers, Stress Exploration) Reason goals not met: n/a Therapeutic equipment acquired: none Reason patient discharged from therapy: Discharge from hospital Pt/family agrees with progress & goals achieved: Yes Date patient discharged from therapy: 11/25/18  Tomi Likens, LRT/CTRS  [place] 11/25/2018, 10:22 AM

## 2018-11-25 NOTE — Progress Notes (Signed)
D:  Patient's self inventory sheet, patient sleeps good, no sleep medication.  Good appetite, low energy level, good concentration.  Rated depression, anxiety, hopeless #6.  Denied withdrawals. Denied SI.  Physical problems, pain, worst pain #8 in past 24 hours.  Goal for [employer] groups.  Problems her mom in [place] has coronavirus 3 days.  No discharge plans.  Home to stay after discharge hotel. A:  Medications administered per MD orders.  Emotional support and encouragement given patient. R:   Denied SI and HI, contracts for safety.  Denied A/V hallucinations.  Safety maintained with 15 minute checks. Patient wants to stay at [facility].  Stated she does not want to leave [facility].

## 2018-11-25 NOTE — Progress Notes (Signed)
Patient ID: Pamela Graham, female   DOB: 10/04/1955, 63 y.o.   MRN: [id number]   CSW received a phone call from pt's daughter, Ovidio Hanger. Pt gave permission to speak to her daughter this morning during treatment team. CSW let pt's daughter know that pt is discharging today. Pt's daughter stated that she should not be discharged because she is violent. She stated, "she has stated that she is going to come hurt me and my family". Pt's daughter asked where she plans to go when she is discharged. CSW let her know that she does have an ACT team and that she is making plans for housing. Pt's daughter demanded to speak to the doctor because she stated, "she is a threat to others and will get violent if she gets out". CSW transfer the pt to the doctor's phone.

## 2018-11-25 NOTE — BHH Suicide Risk Assessment (Signed)
[facility] Discharge Suicide Risk Assessment   Principal Problem: Presumed exacerbation and underlying bipolar condition Discharge Diagnoses: Active Problems:   Bipolar 1 disorder (HCC)   Total Time spent with patient: 45 minutes  Musculoskeletal: Strength & Muscle Tone: within normal limits Gait & Station: normal Patient leans: N/A  Psychiatric Specialty Exam: ROS  Blood pressure 108/76, pulse (!) 119, temperature 97.7 F (36.5 C), temperature source Oral, resp. rate 20, height 5\' 7"  (1.702 m), weight 115.2 kg, SpO2 100 %.Body mass index is 39.78 kg/m.  General Appearance: Casual  Eye Contact::  Good  Speech:  Clear and Coherent409  Volume:  Normal  Mood:  Euthymic  Affect:  Full Range  Thought Process:  Coherent and Descriptions of Associations: Circumstantial  Orientation:  Full (Time, Place, and Person)  Thought Content:  Rumination and Tangential  Suicidal Thoughts:  No  Homicidal Thoughts:  No  Memory:  Immediate;   Fair  Judgement:  Fair  Insight:  Fair  Psychomotor Activity:  Normal  Concentration:  Fair  Recall:  [employer] of Knowledge:Fair  Language: Fair  Akathisia:  Negative  Handed:  Right  AIMS (if indicated):     Assets:  Communication Skills Housing  Sleep:  Number of Hours: 5.25  Cognition: WNL  ADL's:  Intact   Mental Status Per Nursing Assessment::   On Admission:  NA  Demographic Factors:  Unemployed  Loss Factors: NA  Historical Factors: NA  Risk Reduction Factors:   Sense of responsibility to family  Continued Clinical Symptoms:  Bipolar Disorder:   Mixed State  Cognitive Features That Contribute To Risk:  None    Suicide Risk:  Minimal: No identifiable suicidal ideation.  Patients presenting with no risk factors but with morbid ruminations; may be classified as minimal risk based on the severity of the depressive symptoms  Follow-up Information    [facility] Follow up.   Contact information: [place] [address]  [address] [place] [address] [phone]        [facility] Follow up on 12/03/2018.   Why:  Primary care appointment is Thursday, 6/18 at 2:00p.  Please bring your photo ID, current medications and any discharge paperwork from this hospitalization.  Contact information: [address], [address], [place], [place] [address] P: [phone] F: [phone]          Plan Of Care/Follow-up recommendations:  Activity:  full  Suly Vukelich, MD 11/25/2018, 8:10 AM

## 2018-11-25 NOTE — Progress Notes (Signed)
Recreation Therapy Notes     Date:11/25/2018 Time:10:00 am Location:[address] hall   Group Topic:Labeling Triggers  Goal Area(s) Addresses: Patient will work on worksheet on Labeling Triggers . Patient will follow directions on first prompt.  Behavioral Response:Appropriate  Intervention:Worksheet  Activity: Staff on 500 hall were provided with a worksheet on Labeling Triggers. Staff was instructed to give it to the patients and have them work on it in place of Recreation Therapy Group. Staff was also given 2 coloring sheets and 2 word searches and were given the option to give them out.  Education:Ability to follow Directions, Change of thought processes Discharge Planning.   Education Outcome:Acknowledges education/In group clarification offered  Clinical Observations/Feedback:. Due to COVID-19,guidelines group was not held.Group members were provided a learning activity worksheet to work on the topic and above-stated goals. LRT is available to answer any questions patient may have regarding the worksheet.  Lekeisha Arenas L Storm Sovine, LRT/CTRS      Ryleah Miramontes L Idalis Hoelting 11/25/2018 9:59 AM 

## 2018-11-25 NOTE — BHH Suicide Risk Assessment (Addendum)
[facility] Discharge Suicide Risk Assessment   Principal Problem: Bipolar 1 disorder [facility]) Discharge Diagnoses: Principal Problem:   Bipolar 1 disorder ([place])  Patient was seen for a second evaluation.  Her daughter had phoned and was patched through to my office, on the phone the daughter was yelling at me insisting the patient was not stable to be discharged that the patient herself has been phoning and threatening to kill family members.  I told the daughter all I could do was go back and do another mental status exam on the patient which is reflected below.  The patient is alert and oriented and cooperative denying suicidal and homicidal thoughts her mood is stable.  Further, it is noted that the patient phone is outside my office door this patient, nor any other patient had made any phone calls to family members this morning so the patient's daughter seems to be fabricating the situation.  We also met with a member of her act team who confirmed that they had the same some suspicion, that the patient's daughter does not want her in the home and makes up symptoms to indicate the patient is ill..  The patient herself acknowledges this saying "she just lies about me because she does not want me here.  But the mental status exam below reflects the patient's alert oriented cooperative without thoughts of harming self or others and mood is stable on current meds   Total Time spent with patient: 45 minutes  Musculoskeletal: Strength & Muscle Tone: within normal limits Gait & Station: normal Patient leans: N/A  Psychiatric Specialty Exam: ROS  Blood pressure 108/76, pulse (!) 119, temperature 97.7 F (36.5 C), temperature source Oral, resp. rate 20, height '5\' 7"'$  (1.702 m), weight 115.2 kg, SpO2 100 %.Body mass index is 39.78 kg/m.  General Appearance: Casual  Eye Contact::  Good  Speech:  Clear and Coherent409  Volume:  Normal  Mood:  Euthymic  Affect:  Congruent  Thought Process:  Coherent and  Descriptions of Associations: Intact  Orientation:  Full (Time, Place, and Person)  Thought Content:  Logical and Tangential  Suicidal Thoughts:  No  Homicidal Thoughts:  No  Memory:  Immediate;   Good  Judgement:  Good  Insight:  Good  Psychomotor Activity:  Normal  Concentration:  Good  Recall:  Good  Fund of Knowledge:Good  Language: Good  Akathisia:  Negative  Handed:  Right  AIMS (if indicated):     Assets:  Communication Skills Desire for Improvement  Sleep:  Number of Hours: 5.25  Cognition: WNL  ADL's:  Intact   Mental Status Per Nursing Assessment::   On Admission:  NA  Demographic Factors:  Unemployed  Loss Factors: NA  Historical Factors: NA  Risk Reduction Factors:   Sense of responsibility to family, Religious beliefs about death, Living with another person, especially a relative, Positive therapeutic relationship and Positive coping skills or problem solving skills  Continued Clinical Symptoms:  Bipolar Disorder:   Mixed State  Cognitive Features That Contribute To Risk:  None    Suicide Risk:  Minimal: No identifiable suicidal ideation.  Patients presenting with no risk factors but with morbid ruminations; may be classified as minimal risk based on the severity of the depressive symptoms  Follow-up Information    [facility] Follow up.   Contact information: [place] [address] [place] [address] [phone]        [facility] Follow  up on 12/03/2018.   Why:  Primary care appointment is Thursday, 6/18 at 2:00p.  Please bring your photo ID, current medications and any discharge paperwork from this hospitalization.  Contact information: [address], [address], [place], [place] [address] P: [phone] F: [phone]          Plan Of Care/Follow-up recommendations:  Activity:  full  Hazell Siwik, MD 11/25/2018, 9:50 AM
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
## 2018-11-25 NOTE — Progress Notes (Signed)
Patient ID: Pamela Graham, female   DOB: 1955/07/05, 63 y.o.   MRN: [id number]   CSW was contacted by pt's ACT team about pt's discharge and the plan that she has for leaving. Pt's ACT team stated that he is going to talk to his supervisor and get back with the CSW.

## 2018-11-25 NOTE — Progress Notes (Signed)
Discharge Note:  Patient discharged, plans to go to hotel with friend.  Patient denied SI and HI,.  Denied A/V hallucinations.  Patient stated she received all her belongings, clothing, toiletries, misc items, money;, etc.  Suicide prevention information given and discussed with patient who stated she understood and had no questions.  Patient stated she appreciated all assistance received from [facility] staff.  All required discharge information given to patient at discharge.

## 2018-11-25 NOTE — Progress Notes (Signed)
  [facility] Adult Case Management Discharge Plan :  Will you be returning to the same living situation after discharge:  No.; pt reports she is going to go stay at an extended stay At discharge, do you have transportation home?: Yes,  a bus or ACT team Do you have the ability to pay for your medications: Yes,  Medicaid  Release of information consent forms completed and in the chart;  Patient's signature needed at discharge.  Patient to Follow up at: Follow-up Information    [facility] Follow up.   Contact information: [place] [address] [place] [address] [phone]        [facility] Follow up on 12/03/2018.   Why:  Primary care appointment is Thursday, 6/18 at 2:00p.  Please bring your photo ID, current medications and any discharge paperwork from this hospitalization.  Contact information: [address], [address], [place], [place] [address] P: [phone] F: [phone]          Next level of care provider has access to [place] and Suicide Prevention discussed: Yes,  with pt's granddaughter  Have you used any form of tobacco in the last 30 days? (Cigarettes, Smokeless Tobacco, Cigars, and/or Pipes): No  Has patient been referred to the Quitline?: N/A patient is not a smoker  Patient has been referred for addiction treatment: [place], LCSW 11/25/2018, 10:11 AM

## 2018-11-25 NOTE — Plan of Care (Signed)
Patient was admitted on 6/5 and offered group materials daily since.

## 2018-12-07 DIAGNOSIS — I10 Essential (primary) hypertension: Secondary | ICD-10-CM | POA: Diagnosis not present

## 2018-12-07 DIAGNOSIS — Z124 Encounter for screening for malignant neoplasm of cervix: Secondary | ICD-10-CM | POA: Diagnosis not present

## 2018-12-07 DIAGNOSIS — Z131 Encounter for screening for diabetes mellitus: Secondary | ICD-10-CM | POA: Diagnosis not present

## 2018-12-07 DIAGNOSIS — Z1211 Encounter for screening for malignant neoplasm of colon: Secondary | ICD-10-CM | POA: Diagnosis not present

## 2018-12-07 DIAGNOSIS — F319 Bipolar disorder, unspecified: Secondary | ICD-10-CM | POA: Diagnosis not present

## 2018-12-07 DIAGNOSIS — Z1239 Encounter for other screening for malignant neoplasm of breast: Secondary | ICD-10-CM | POA: Diagnosis not present

## 2018-12-07 DIAGNOSIS — Z13220 Encounter for screening for lipoid disorders: Secondary | ICD-10-CM | POA: Diagnosis not present

## 2018-12-07 DIAGNOSIS — Z23 Encounter for immunization: Secondary | ICD-10-CM | POA: Diagnosis not present

## 2018-12-07 DIAGNOSIS — F17210 Nicotine dependence, cigarettes, uncomplicated: Secondary | ICD-10-CM | POA: Diagnosis not present

## 2018-12-07 DIAGNOSIS — R6889 Other general symptoms and signs: Secondary | ICD-10-CM | POA: Diagnosis not present

## 2018-12-31 DIAGNOSIS — R6889 Other general symptoms and signs: Secondary | ICD-10-CM | POA: Diagnosis not present

## 2018-12-31 DIAGNOSIS — R296 Repeated falls: Secondary | ICD-10-CM | POA: Diagnosis not present

## 2018-12-31 DIAGNOSIS — N289 Disorder of kidney and ureter, unspecified: Secondary | ICD-10-CM | POA: Diagnosis not present

## 2018-12-31 DIAGNOSIS — H547 Unspecified visual loss: Secondary | ICD-10-CM | POA: Diagnosis not present

## 2018-12-31 DIAGNOSIS — I10 Essential (primary) hypertension: Secondary | ICD-10-CM | POA: Diagnosis not present

## 2019-01-01 DIAGNOSIS — F4310 Post-traumatic stress disorder, unspecified: Secondary | ICD-10-CM | POA: Diagnosis not present

## 2019-01-01 DIAGNOSIS — F319 Bipolar disorder, unspecified: Secondary | ICD-10-CM | POA: Diagnosis not present

## 2019-01-01 DIAGNOSIS — F5105 Insomnia due to other mental disorder: Secondary | ICD-10-CM | POA: Diagnosis not present

## 2019-01-01 DIAGNOSIS — R6889 Other general symptoms and signs: Secondary | ICD-10-CM | POA: Diagnosis not present

## 2019-01-01 DIAGNOSIS — F1220 Cannabis dependence, uncomplicated: Secondary | ICD-10-CM | POA: Diagnosis not present

## 2019-01-01 DIAGNOSIS — F411 Generalized anxiety disorder: Secondary | ICD-10-CM | POA: Diagnosis not present

## 2019-01-07 DIAGNOSIS — R944 Abnormal results of kidney function studies: Secondary | ICD-10-CM | POA: Diagnosis not present

## 2019-01-07 DIAGNOSIS — I10 Essential (primary) hypertension: Secondary | ICD-10-CM | POA: Diagnosis not present

## 2019-01-07 DIAGNOSIS — R6889 Other general symptoms and signs: Secondary | ICD-10-CM | POA: Diagnosis not present

## 2019-01-20 ENCOUNTER — Ambulatory Visit (HOSPITAL_COMMUNITY)
Admission: EM | Admit: 2019-01-20 | Discharge: 2019-01-20 | Disposition: A | Discharge status: Home / Self Care | Payer: Medicare HMO | Attending: Emergency Medicine | Admitting: Emergency Medicine

## 2019-01-20 ENCOUNTER — Other Ambulatory Visit: Payer: Self-pay

## 2019-01-20 ENCOUNTER — Encounter (HOSPITAL_COMMUNITY): Payer: Self-pay

## 2019-01-20 DIAGNOSIS — K0889 Other specified disorders of teeth and supporting structures: Secondary | ICD-10-CM

## 2019-01-20 MED ORDER — CLINDAMYCIN HCL 300 MG PO CAPS: 300.0000 mg | ORAL_CAPSULE | Freq: Three times a day (TID) | ORAL | 0 refills | Status: DC

## 2019-01-20 MED ORDER — HYDROCODONE-ACETAMINOPHEN 5-325 MG PO TABS: 2.0000 | ORAL_TABLET | ORAL | 0 refills | Status: DC | PRN

## 2019-01-20 NOTE — ED Provider Notes (Signed)
[facility]    CSN: [id number] Arrival date & time: 01/20/19  [id number]     History   Chief Complaint Chief Complaint  Patient presents with  . Dental Pain    HPI Pamela Graham is a 63 y.o. female.   Patient presents with right upper tooth pain x4 days.  She states she broke this tooth in January 2020 but did not start to have pain until 4 days ago.  She states the pain is worse with chewing; no alleviating factors.  She has taken Tylenol at home without relief.  She has an appointment with her dentist on 02/16/2019.  She denies fever, difficulty swallowing, difficulty breathing, or other symptoms.     The history is provided by the patient.    Past Medical History:  Diagnosis Date  . Barrett's esophagus   . DDD (degenerative disc disease), lumbar   . Fibromyalgia   . Gastric ulcer   . Hypertension   . Migraines     Patient Active Problem List   Diagnosis Date Noted  . Bipolar 1 disorder (HCC) 11/20/2018  . Moderate bipolar I disorder, most recent episode depressed (HCC)   . 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 ABDOMINAL HYSTERECTOMY    . COLONOSCOPY    . UPPER GI ENDOSCOPY      OB History   No obstetric history on file.      Home Medications    Prior to Admission medications   Medication Sig Start Date End Date Taking? Authorizing Provider  carbamazepine (TEGRETOL) 100 MG chewable tablet Chew 1 tablet (100 mg total) by mouth 2 (two) times a day. 11/25/18   Malvin JohnsFarah, Brian, MD  clindamycin (CLEOCIN) 300 MG capsule Take 1 capsule (300 mg total) by mouth 3 (three) times daily. 01/20/19   Mickie Bailate, Lissa Rowles H, NP  clonazePAM (KLONOPIN) 0.5 MG tablet Take 0.5 tablets (0.25 mg total) by mouth 2 (two) times daily. 11/25/18   Malvin JohnsFarah, Brian, MD  cyclobenzaprine (FLEXERIL) 5 MG tablet Take 1 tablet (5 mg total) by mouth 3 (three) times daily. 11/25/18   Malvin JohnsFarah, Brian, MD   DULoxetine (CYMBALTA) 60 MG capsule Take 1 capsule by mouth daily. 11/20/18   [provider]  hydrochlorothiazide (HYDRODIURIL) 25 MG tablet Take 1 tablet (25 mg total) by mouth daily. 08/23/17   Money, Gerlene Burdockravis B, FNP  HYDROcodone-acetaminophen (NORCO/VICODIN) 5-325 MG tablet Take 2 tablets by mouth every 4 (four) hours as needed. 01/20/19   Mickie Bailate, Hiliary Osorto H, NP  omeprazole (PRILOSEC) 40 MG capsule Take 1 capsule by mouth daily. 11/20/18   [provider]  perphenazine (TRILAFON) 4 MG tablet 1 in am 2 at h s 11/25/18   Malvin JohnsFarah, Brian, MD  prazosin (MINIPRESS) 2 MG capsule Take 2 capsules (4 mg total) by mouth at bedtime. 11/25/18   Malvin JohnsFarah, Brian, MD  temazepam (RESTORIL) 30 MG capsule Take 1 capsule (30 mg total) by mouth at bedtime. 11/25/18   Malvin JohnsFarah, Brian, MD    Family History No family history on file.  Social History Social History   Tobacco Use  . Smoking status: Former Games developermoker  . Smokeless tobacco: Never Used  Substance Use Topics  . Alcohol use: No    Frequency: Never  . Drug use: Yes    Types: Marijuana    Comment: Last used: Today.      Allergies   Lisinopril, Gabapentin, Haloperidol, Ibuprofen, Penicillins, Pregabalin, Risperidone and  related, and Tramadol   Review of Systems Review of Systems  Constitutional: Negative for chills and fever.  HENT: Positive for dental problem. Negative for ear pain, sore throat and trouble swallowing.   Eyes: Negative for pain and visual disturbance.  Respiratory: Negative for cough and shortness of breath.   Cardiovascular: Negative for chest pain and palpitations.  Gastrointestinal: Negative for abdominal pain and vomiting.  Genitourinary: Negative for dysuria and hematuria.  Musculoskeletal: Negative for arthralgias and back pain.  Skin: Negative for color change and rash.  Neurological: Negative for seizures and syncope.  All other systems reviewed and are negative.    Physical Exam Triage Vital Signs ED Triage Vitals   Enc Vitals Group     BP 01/20/19 0907 110/80     Pulse Rate 01/20/19 0907 100     Resp 01/20/19 0907 18     Temp 01/20/19 0907 97.9 F (36.6 C)     Temp Source 01/20/19 0907 Temporal     SpO2 01/20/19 0907 100 %     Weight 01/20/19 0908 260 lb (117.9 kg)     Height --      Head Circumference --      Peak Flow --      Pain Score 01/20/19 0908 10     Pain Loc --      Pain Edu? --      Excl. in [place]? --    No data found.  Updated Vital Signs BP 110/80 (BP Location: Right Arm)   Pulse 100   Temp 97.9 F (36.6 C) (Temporal)   Resp 18   Wt 260 lb (117.9 kg)   SpO2 100%   BMI 40.72 kg/m   Visual Acuity Right Eye Distance:   Left Eye Distance:   Bilateral Distance:    Right Eye Near:   Left Eye Near:    Bilateral Near:     Physical Exam Vitals signs and nursing note reviewed.  Constitutional:      General: She is not in acute distress.    Appearance: She is well-developed.  HENT:     Head: Normocephalic and atraumatic.     Right Ear: Tympanic membrane normal.     Left Ear: Tympanic membrane normal.     Mouth/Throat:     Mouth: Mucous membranes are moist.     Dentition: Dental tenderness and dental caries present.     Pharynx: Oropharynx is clear. Uvula midline.   Eyes:     Conjunctiva/sclera: Conjunctivae normal.  Neck:     Musculoskeletal: Neck supple.  Cardiovascular:     Rate and Rhythm: Normal rate and regular rhythm.     Heart sounds: No murmur.  Pulmonary:     Effort: Pulmonary effort is normal. No respiratory distress.     Breath sounds: Normal breath sounds.  Abdominal:     Palpations: Abdomen is soft.     Tenderness: There is no abdominal tenderness.  Skin:    General: Skin is warm and dry.  Neurological:     Mental Status: She is alert and oriented to person, place, and time.  Psychiatric:        Mood and Affect: Mood normal.        Behavior: Behavior normal.      UC Treatments / Results  Labs (all labs ordered are listed, but only  abnormal results are displayed) Labs Reviewed - No data to display  EKG   Radiology No results found.  Procedures Procedures (including critical care  time)  Medications Ordered in UC Medications - No data to display  Initial Impression / Assessment and Plan / UC Course  I have reviewed the triage vital signs and the nursing notes.  Pertinent labs & imaging results that were available during my care of the patient were reviewed by me and considered in my medical decision making (see chart for details).   Dental pain.  Treating with clindamycin as patient is allergic to penicillin.  Treating pain with short course of Norco; precautions given to patient not to drive, operate machinery, drink alcohol as this medication may make her drowsy.  Dental resource guide given.  Instructed patient to follow-up with her dentist as scheduled on 02/16/2019.     Final Clinical Impressions(s) / UC Diagnoses   Final diagnoses:  Pain, dental     Discharge Instructions     Take the antibiotic clindamycin as prescribed.    You can take the prescribed pain medication Norco as needed for severe pain.  This medication will make you drowsy.  Do not drive, operate machinery, or drink alcohol while taking this medication.    Follow-up with your dentist as scheduled.  If you need it, a list of dental resources is attached.        ED Prescriptions    Medication Sig Dispense Auth. Provider   clindamycin (CLEOCIN) 300 MG capsule Take 1 capsule (300 mg total) by mouth 3 (three) times daily. 21 capsule Wendee Beaversate, Riyansh Gerstner H, NP   HYDROcodone-acetaminophen (NORCO/VICODIN) 5-325 MG tablet Take 2 tablets by mouth every 4 (four) hours as needed. 6 tablet Mickie Bailate, Regginald Pask H, NP     Controlled Substance Prescriptions [place] Controlled Substance Registry consulted? Yes, I have consulted the [place] Controlled Substances Registry for this patient, and feel the risk/benefit ratio today is favorable for proceeding with this  prescription for a controlled substance.   Mickie Bailate, Nakiesha Rumsey H, NP 01/20/19 [phone]

## 2019-01-20 NOTE — ED Triage Notes (Signed)
Pt states she has a broken wisdom tooth . Pt states she has tooth pain.

## 2019-01-20 NOTE — Discharge Instructions (Signed)
Take the antibiotic clindamycin as prescribed.    You can take the prescribed pain medication Norco as needed for severe pain.  This medication will make you drowsy.  Do not drive, operate machinery, or drink alcohol while taking this medication.    Follow-up with your dentist as scheduled.  If you need it, a list of dental resources is attached.

## 2019-02-24 DIAGNOSIS — F411 Generalized anxiety disorder: Secondary | ICD-10-CM | POA: Diagnosis not present

## 2019-02-24 DIAGNOSIS — R6889 Other general symptoms and signs: Secondary | ICD-10-CM | POA: Diagnosis not present

## 2019-02-24 DIAGNOSIS — I10 Essential (primary) hypertension: Secondary | ICD-10-CM | POA: Diagnosis not present

## 2019-02-24 DIAGNOSIS — F319 Bipolar disorder, unspecified: Secondary | ICD-10-CM | POA: Diagnosis not present

## 2019-02-24 DIAGNOSIS — F5105 Insomnia due to other mental disorder: Secondary | ICD-10-CM | POA: Diagnosis not present

## 2019-02-24 DIAGNOSIS — F4310 Post-traumatic stress disorder, unspecified: Secondary | ICD-10-CM | POA: Diagnosis not present

## 2019-03-01 DIAGNOSIS — R1310 Dysphagia, unspecified: Secondary | ICD-10-CM | POA: Diagnosis not present

## 2019-03-01 DIAGNOSIS — R6889 Other general symptoms and signs: Secondary | ICD-10-CM | POA: Diagnosis not present

## 2019-03-01 DIAGNOSIS — Z86010 Personal history of colonic polyps: Secondary | ICD-10-CM | POA: Diagnosis not present

## 2019-03-01 DIAGNOSIS — K2270 Barrett's esophagus without dysplasia: Secondary | ICD-10-CM | POA: Diagnosis not present

## 2019-03-01 DIAGNOSIS — Z6841 Body Mass Index (BMI) 40.0 and over, adult: Secondary | ICD-10-CM | POA: Diagnosis not present

## 2019-03-02 DIAGNOSIS — H5213 Myopia, bilateral: Secondary | ICD-10-CM | POA: Diagnosis not present

## 2019-03-02 DIAGNOSIS — H04123 Dry eye syndrome of bilateral lacrimal glands: Secondary | ICD-10-CM | POA: Diagnosis not present

## 2019-03-02 DIAGNOSIS — H02535 Eyelid retraction left lower eyelid: Secondary | ICD-10-CM | POA: Diagnosis not present

## 2019-03-02 DIAGNOSIS — H0288A Meibomian gland dysfunction right eye, upper and lower eyelids: Secondary | ICD-10-CM | POA: Diagnosis not present

## 2019-03-02 DIAGNOSIS — H02532 Eyelid retraction right lower eyelid: Secondary | ICD-10-CM | POA: Diagnosis not present

## 2019-03-02 DIAGNOSIS — H0288B Meibomian gland dysfunction left eye, upper and lower eyelids: Secondary | ICD-10-CM | POA: Diagnosis not present

## 2019-03-02 DIAGNOSIS — R6889 Other general symptoms and signs: Secondary | ICD-10-CM | POA: Diagnosis not present

## 2019-03-02 DIAGNOSIS — H25813 Combined forms of age-related cataract, bilateral: Secondary | ICD-10-CM | POA: Diagnosis not present

## 2019-03-02 DIAGNOSIS — H35033 Hypertensive retinopathy, bilateral: Secondary | ICD-10-CM | POA: Diagnosis not present

## 2019-03-02 DIAGNOSIS — E119 Type 2 diabetes mellitus without complications: Secondary | ICD-10-CM | POA: Diagnosis not present

## 2019-03-25 DIAGNOSIS — R6889 Other general symptoms and signs: Secondary | ICD-10-CM | POA: Diagnosis not present

## 2019-03-26 DIAGNOSIS — M797 Fibromyalgia: Secondary | ICD-10-CM | POA: Diagnosis not present

## 2019-03-26 DIAGNOSIS — F319 Bipolar disorder, unspecified: Secondary | ICD-10-CM | POA: Diagnosis not present

## 2019-03-26 DIAGNOSIS — I10 Essential (primary) hypertension: Secondary | ICD-10-CM | POA: Diagnosis not present

## 2019-03-26 DIAGNOSIS — R6889 Other general symptoms and signs: Secondary | ICD-10-CM | POA: Diagnosis not present

## 2019-03-26 DIAGNOSIS — R944 Abnormal results of kidney function studies: Secondary | ICD-10-CM | POA: Diagnosis not present

## 2019-03-31 DIAGNOSIS — Z86010 Personal history of colonic polyps: Secondary | ICD-10-CM | POA: Diagnosis not present

## 2019-03-31 DIAGNOSIS — Z9119 Patient's noncompliance with other medical treatment and regimen: Secondary | ICD-10-CM | POA: Diagnosis not present

## 2019-03-31 DIAGNOSIS — K2270 Barrett's esophagus without dysplasia: Secondary | ICD-10-CM | POA: Diagnosis not present

## 2019-03-31 DIAGNOSIS — K449 Diaphragmatic hernia without obstruction or gangrene: Secondary | ICD-10-CM | POA: Diagnosis not present

## 2019-03-31 DIAGNOSIS — Z538 Procedure and treatment not carried out for other reasons: Secondary | ICD-10-CM | POA: Diagnosis not present

## 2019-03-31 DIAGNOSIS — R1310 Dysphagia, unspecified: Secondary | ICD-10-CM | POA: Diagnosis not present

## 2019-04-02 DIAGNOSIS — R6889 Other general symptoms and signs: Secondary | ICD-10-CM | POA: Diagnosis not present

## 2019-05-18 DIAGNOSIS — M797 Fibromyalgia: Secondary | ICD-10-CM | POA: Diagnosis not present

## 2019-05-18 DIAGNOSIS — G8929 Other chronic pain: Secondary | ICD-10-CM | POA: Diagnosis not present

## 2019-05-18 DIAGNOSIS — M7918 Myalgia, other site: Secondary | ICD-10-CM | POA: Diagnosis not present

## 2019-05-18 DIAGNOSIS — R6889 Other general symptoms and signs: Secondary | ICD-10-CM | POA: Diagnosis not present

## 2019-05-18 DIAGNOSIS — I10 Essential (primary) hypertension: Secondary | ICD-10-CM | POA: Diagnosis not present

## 2019-05-18 DIAGNOSIS — M25562 Pain in left knee: Secondary | ICD-10-CM | POA: Diagnosis not present

## 2019-05-18 DIAGNOSIS — F1210 Cannabis abuse, uncomplicated: Secondary | ICD-10-CM | POA: Diagnosis not present

## 2020-03-27 ENCOUNTER — Other Ambulatory Visit: Payer: Self-pay

## 2020-03-27 ENCOUNTER — Ambulatory Visit (HOSPITAL_COMMUNITY)
Admission: EM | Admit: 2020-03-27 | Discharge: 2020-03-27 | Discharge status: Against Medical Advice | Payer: Medicare HMO

## 2020-06-12 ENCOUNTER — Emergency Department (HOSPITAL_COMMUNITY)
Admission: EM | Admit: 2020-06-12 | Discharge: 2020-06-14 | Disposition: A | Discharge status: Home / Self Care | Payer: Medicare HMO | Attending: Emergency Medicine | Admitting: Emergency Medicine

## 2020-06-12 ENCOUNTER — Other Ambulatory Visit: Payer: Self-pay

## 2020-06-12 ENCOUNTER — Encounter (HOSPITAL_COMMUNITY): Payer: Self-pay | Admitting: Emergency Medicine

## 2020-06-12 DIAGNOSIS — Z20822 Contact with and (suspected) exposure to covid-19: Secondary | ICD-10-CM | POA: Insufficient documentation

## 2020-06-12 DIAGNOSIS — Z87891 Personal history of nicotine dependence: Secondary | ICD-10-CM | POA: Diagnosis not present

## 2020-06-12 DIAGNOSIS — R456 Violent behavior: Secondary | ICD-10-CM | POA: Insufficient documentation

## 2020-06-12 DIAGNOSIS — F3113 Bipolar disorder, current episode manic without psychotic features, severe: Secondary | ICD-10-CM | POA: Insufficient documentation

## 2020-06-12 DIAGNOSIS — Z046 Encounter for general psychiatric examination, requested by authority: Secondary | ICD-10-CM | POA: Diagnosis not present

## 2020-06-12 DIAGNOSIS — I10 Essential (primary) hypertension: Secondary | ICD-10-CM | POA: Diagnosis not present

## 2020-06-12 DIAGNOSIS — Z79899 Other long term (current) drug therapy: Secondary | ICD-10-CM | POA: Diagnosis not present

## 2020-06-12 DIAGNOSIS — F3160 Bipolar disorder, current episode mixed, unspecified: Secondary | ICD-10-CM

## 2020-06-12 DIAGNOSIS — R4689 Other symptoms and signs involving appearance and behavior: Secondary | ICD-10-CM

## 2020-06-12 LAB — COMPREHENSIVE METABOLIC PANEL
ALT: 23 U/L (ref 0–44)
AST: 43 U/L — ABNORMAL HIGH (ref 15–41)
Albumin: 3.8 g/dL (ref 3.5–5.0)
Alkaline Phosphatase: 102 U/L (ref 38–126)
Anion gap: 11 (ref 5–15)
BUN: 8 mg/dL (ref 8–23)
CO2: 22 mmol/L (ref 22–32)
Calcium: 9 mg/dL (ref 8.9–10.3)
Chloride: 107 mmol/L (ref 98–111)
Creatinine, Ser: 1.14 mg/dL — ABNORMAL HIGH (ref 0.44–1.00)
GFR, Estimated: 54 mL/min — ABNORMAL LOW (ref >60)
Glucose, Bld: 129 mg/dL — ABNORMAL HIGH (ref 70–99)
Potassium: 3.7 mmol/L (ref 3.5–5.1)
Sodium: 140 mmol/L (ref 135–145)
Total Bilirubin: 0.2 mg/dL — ABNORMAL LOW (ref 0.3–1.2)
Total Protein: 7.1 g/dL (ref 6.5–8.1)

## 2020-06-12 LAB — CBC
HCT: 37 % (ref 36.0–46.0)
Hemoglobin: 12.3 g/dL (ref 12.0–15.0)
MCH: 32.3 pg (ref 26.0–34.0)
MCHC: 33.2 g/dL (ref 30.0–36.0)
MCV: 97.1 fL (ref 80.0–100.0)
Platelets: 194 10*3/uL (ref 150–400)
RBC: 3.81 MIL/uL — ABNORMAL LOW (ref 3.87–5.11)
RDW: 14.1 % (ref 11.5–15.5)
WBC: 8.5 10*3/uL (ref 4.0–10.5)
nRBC: 0 % (ref 0.0–0.2)

## 2020-06-12 LAB — SALICYLATE LEVEL: Salicylate Lvl: <7 mg/dL — ABNORMAL LOW (ref 7.0–30.0)

## 2020-06-12 LAB — RESP PANEL BY RT-PCR (FLU A&B, COVID) ARPGX2
Influenza A by PCR: NEGATIVE
Influenza B by PCR: NEGATIVE
SARS Coronavirus 2 by RT PCR: NEGATIVE

## 2020-06-12 LAB — CBG MONITORING, ED: Glucose-Capillary: 134 mg/dL — ABNORMAL HIGH (ref 70–99)

## 2020-06-12 LAB — ACETAMINOPHEN LEVEL: Acetaminophen (Tylenol), Serum: <10 ug/mL — ABNORMAL LOW (ref 10–30)

## 2020-06-12 LAB — ETHANOL: Alcohol, Ethyl (B): <10 mg/dL (ref <10)

## 2020-06-12 MED ORDER — ONDANSETRON HCL 4 MG PO TABS: 4.0000 mg | ORAL_TABLET | Freq: Three times a day (TID) | ORAL | Status: DC | PRN

## 2020-06-12 MED ORDER — ACETAMINOPHEN 325 MG PO TABS
650.0000 mg | ORAL_TABLET | ORAL | Status: DC | PRN
  Administered 2020-06-13: 650 mg via ORAL
  Filled 2020-06-12 (×2): qty 2

## 2020-06-12 NOTE — ED Notes (Addendum)
Belongings inventoried and placed in locker 2. 
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
## 2020-06-12 NOTE — ED Notes (Signed)
Pt being TTS'd 
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
## 2020-06-13 DIAGNOSIS — Z046 Encounter for general psychiatric examination, requested by authority: Secondary | ICD-10-CM | POA: Diagnosis not present

## 2020-06-13 LAB — RAPID URINE DRUG SCREEN, HOSP PERFORMED
Amphetamines: NOT DETECTED
Barbiturates: NOT DETECTED
Benzodiazepines: POSITIVE — AB
Cocaine: NOT DETECTED
Opiates: NOT DETECTED
Tetrahydrocannabinol: POSITIVE — AB

## 2020-06-13 MED ORDER — ZIPRASIDONE MESYLATE 20 MG IM SOLR: 20.0000 mg | Freq: Once | INTRAMUSCULAR | Status: AC

## 2020-06-13 MED ORDER — DIPHENHYDRAMINE HCL 50 MG/ML IJ SOLN
50.0000 mg | Freq: Once | INTRAMUSCULAR | Status: AC
  Administered 2020-06-13: 01:00:00 50 mg via INTRAVENOUS
  Filled 2020-06-13: qty 1

## 2020-06-13 MED ORDER — ZIPRASIDONE MESYLATE 20 MG IM SOLR
20.0000 mg | Freq: Once | INTRAMUSCULAR | Status: AC
  Administered 2020-06-13: 20 mg via INTRAMUSCULAR
  Filled 2020-06-13: qty 20

## 2020-06-13 MED ORDER — NICOTINE 21 MG/24HR TD PT24
21.0000 mg | MEDICATED_PATCH | Freq: Once | TRANSDERMAL | Status: AC
  Administered 2020-06-13: 01:00:00 21 mg via TRANSDERMAL
  Filled 2020-06-13: qty 1

## 2020-06-13 MED ORDER — TEMAZEPAM 15 MG PO CAPS
30.0000 mg | ORAL_CAPSULE | Freq: Every day | ORAL | Status: DC
  Administered 2020-06-13 (×2): 30 mg via ORAL
  Filled 2020-06-13 (×2): qty 2

## 2020-06-13 MED ORDER — DULOXETINE HCL 60 MG PO CPEP: 60.0000 mg | ORAL_CAPSULE | Freq: Every day | ORAL | Status: DC

## 2020-06-13 MED ORDER — PERPHENAZINE 4 MG PO TABS: 4.0000 mg | ORAL_TABLET | Freq: Two times a day (BID) | ORAL | Status: DC

## 2020-06-13 MED ORDER — STERILE WATER FOR INJECTION IJ SOLN
INTRAMUSCULAR | Status: AC
  Administered 2020-06-13: 1.2 mL
  Filled 2020-06-13: qty 10

## 2020-06-13 MED ORDER — DULOXETINE HCL 30 MG PO CPEP
30.0000 mg | ORAL_CAPSULE | Freq: Every day | ORAL | Status: DC
  Administered 2020-06-13 (×2): 30 mg via ORAL
  Filled 2020-06-13 (×2): qty 1

## 2020-06-13 MED ORDER — PRAZOSIN HCL 2 MG PO CAPS: 4.0000 mg | ORAL_CAPSULE | Freq: Every day | ORAL | Status: DC

## 2020-06-13 MED ORDER — CARBAMAZEPINE 100 MG PO CHEW
100.0000 mg | CHEWABLE_TABLET | Freq: Two times a day (BID) | ORAL | Status: DC
  Filled 2020-06-13: qty 1

## 2020-06-13 MED ORDER — CARBAMAZEPINE ER 200 MG PO TB12
200.0000 mg | ORAL_TABLET | Freq: Two times a day (BID) | ORAL | Status: DC
  Administered 2020-06-13 (×3): 200 mg via ORAL
  Filled 2020-06-13 (×5): qty 1

## 2020-06-13 MED ORDER — ZIPRASIDONE MESYLATE 20 MG IM SOLR
INTRAMUSCULAR | Status: AC
  Administered 2020-06-13: 01:00:00 20 mg via INTRAMUSCULAR
  Filled 2020-06-13: qty 20

## 2020-06-13 MED ORDER — LORAZEPAM 2 MG/ML IJ SOLN
2.0000 mg | Freq: Once | INTRAMUSCULAR | Status: AC
  Administered 2020-06-13: 01:00:00 2 mg via INTRAMUSCULAR
  Filled 2020-06-13: qty 1

## 2020-06-13 MED ORDER — LORAZEPAM 2 MG/ML IJ SOLN
2.0000 mg | Freq: Once | INTRAMUSCULAR | Status: AC
  Administered 2020-06-14: 2 mg via INTRAMUSCULAR
  Filled 2020-06-13: qty 1

## 2020-06-13 MED ORDER — STERILE WATER FOR INJECTION IJ SOLN
INTRAMUSCULAR | Status: AC
  Filled 2020-06-13: qty 10

## 2020-06-13 MED ORDER — PANTOPRAZOLE SODIUM 40 MG PO TBEC
40.0000 mg | DELAYED_RELEASE_TABLET | Freq: Every day | ORAL | Status: DC
  Administered 2020-06-13: 13:00:00 40 mg via ORAL
  Filled 2020-06-13: qty 1

## 2020-06-13 MED ORDER — HYDROCHLOROTHIAZIDE 25 MG PO TABS: 25.0000 mg | ORAL_TABLET | Freq: Every day | ORAL | Status: DC

## 2020-06-13 NOTE — ED Provider Notes (Addendum)
I was asked to evaluate the patient by a charge RN at 00:30 as the patient was agitated.  On my evaluation, she was screaming and verbally assaulting staff and using profanity.  I attempted to verbally redirect the patient, but she was unable to be verbally redirected and became increasingly agitated.  00:40 -2 mg of  Ativan and 50 mg of Benadryl given  00:55- Patient continues to be agitated and screaming.  She has been attempted to be verbally redirected multiple times without improvement.  Will order Geodon.  00:25-patient reevaluation.  She is now sitting in bed comfortably eating a sandwich and requesting to watch TV.  She is no longer agitated and is engaging in conversation with staff.  EKG was obtained.  No prolonged QTC.  I have also ordered her a nicotine patch.   Physical Exam  BP 111/75   Pulse 90   Temp 98.2 F (36.8 C)   Resp 15   Ht 5\' 7"  (1.702 m)   Wt 118 kg   SpO2 100%   BMI 40.74 kg/m   Physical Exam Vitals and nursing note reviewed.  Constitutional:      Appearance: She is well-developed and well-nourished. She is obese.  HENT:     Head: Normocephalic and atraumatic.  Eyes:     Pupils: Pupils are equal, round, and reactive to light.  Cardiovascular:     Rate and Rhythm: Normal rate.  Pulmonary:     Effort: Pulmonary effort is normal.  Abdominal:     General: There is no distension.  Musculoskeletal:        General: Normal range of motion.     Cervical back: Normal range of motion.  Neurological:     Mental Status: She is alert and oriented to person, place, and time.  Psychiatric:        Mood and Affect: Affect is angry and inappropriate.        Speech: Speech is rapid and pressured.        Behavior: Behavior is aggressive.        Cognition and Memory: She exhibits impaired recent memory.        Judgment: Judgment is inappropriate.     Comments: Patient is standing in the doorway and screaming profanities at staff.     ED Course/Procedures      .Critical Care Performed by: , PA-C Authorized by: Barkley Boards, PA-C   Critical care provider statement:    Critical care time (minutes):  50   Critical care time was exclusive of:  Separately billable procedures and treating other patients and teaching time   Critical care was necessary to treat or prevent imminent or life-threatening deterioration of the following conditions: Psychosis.   Critical care was time spent personally by me on the following activities:  Ordering and review of laboratory studies, ordering and performing treatments and interventions, re-evaluation of patient's condition, examination of patient, evaluation of patient's response to treatment and development of treatment plan with patient or surrogate    MDM    64 year old female who presented to the emergency department under IVC for agitation.  First exam was previously completed by Dr. 68.  I was asked to evaluate the patient at bedside for increasing agitation.  Attempted multiple times to redirect the patient verbally without success.  Attempted Ativan and Benadryl for agitation without improvement. No previous EKG was on file, the patient did not have a history of prolonged QTC. Discussed with Dr. Fredderick Phenix, he  is agreeable to Geodon, which has been ordered.  Patient is now resting comfortably watching TV and eating a sandwich and chatting pleasantly with staff.  EKG was obtained without prolonged QTC.  Patient is stable and appropriate at this time.      Barkley Boards, PA-C 06/13/20 0128    Barkley Boards, PA-C 06/13/20 0129    Nira Conn, MD 06/13/20 [phone]

## 2020-06-13 NOTE — Progress Notes (Signed)
Per Nira Conn FNP this patient meets criteria for inpatient treatment.   Pt is under IVC  Patient was referred to the following facilities for admission:    [facility]        [facility]        [facility]        [facility]        [facility]        [facility]        [facility]        [facility]        [facility]        [facility]        [facility]        [facility]        [facility]        [facility]        [facility]        [facility]        [facility]   Disposition CSW will continue assist in seeking bed placement.    Jacinta Shoe, LCSW Clinical Social Worker - Disposition [phone]

## 2020-06-13 NOTE — ED Notes (Signed)
Dinner Tray Ordered @ 1724 

## 2020-06-13 NOTE — ED Notes (Addendum)
Pt given coloring pages, bagged lunch, a gingerale to drink

## 2020-06-13 NOTE — ED Notes (Signed)
Breakfast Ordered 

## 2020-06-13 NOTE — ED Notes (Signed)
Pt continuing to yell at staff. Pt unable to be redirected or calmed down. PA at bedside with pt.

## 2020-06-13 NOTE — Progress Notes (Signed)
Pt accepted to [facility] main [facility]   Dr. Estill Cotta is the accepting/attending provider.    Call report to [phone] or [phone]   [place] [facility] ED notified via secure chat    Pt is under IVC and will be transported by law enforcement  Pt is scheduled to arrive at [facility] on 12/29 after 8am.    Wells Guiles, MSW, LCSW, LCAS Clinical Social Worker II Disposition CSW [phone]
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
## 2020-06-13 NOTE — ED Provider Notes (Signed)
Emergency Medicine Observation Re-evaluation Note  Pamela Graham is a 64 y.o. female, seen on rounds today.  Pt initially presented to the ED for complaints of IVC and Aggressive Behavior Currently, the patient is calm and alert. Pt is awaiting BH placement.   Physical Exam  BP (!) 153/98 (BP Location: Left Arm)   Pulse 73   Temp (!) 97.5 F (36.4 C) (Axillary)   Resp 17   Ht 1.702 m (5\' 7" )   Wt 118 kg   SpO2 98%   BMI 40.74 kg/m  Physical Exam General: alert, content. Cardiac: normal rate. Lungs: no increased wob.  Psych: calm, alert.   ED Course / MDM  EKG:    I have reviewed the labs performed to date as well as medications administered while in observation.  Recent changes in the last 24 hours include on medication/stabilization.   Plan  Current plan is for inpatient psych  Treatment.  BH reassessment, and placement remains pending.      , MD 06/13/20 [phone]
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
## 2020-06-13 NOTE — ED Notes (Signed)
Pt up by room door growing increasingly agitated, yelling at staff and [employer]. PA made aware of situation.

## 2020-06-13 NOTE — ED Provider Notes (Signed)
Patient became markedly agitated, throwing items within her room.  She required sedation for her protection and protection of staff.  She is given ziprasidone.  11:48 PM Patient still very agitated. She is given Lorazepam.  12:09 AM Patient still awake, but calmly talking with sitter.  CRITICAL CARE Performed by: Dione Booze Total critical care time: 45 minutes Critical care time was exclusive of separately billable procedures and treating other patients. Critical care was necessary to treat or prevent imminent or life-threatening deterioration. Critical care was time spent personally by me on the following activities: development of treatment plan with patient and/or surrogate as well as nursing, discussions with consultants, evaluation of patient's response to treatment, examination of patient, obtaining history from patient or surrogate, ordering and performing treatments and interventions, ordering and review of laboratory studies, ordering and review of radiographic studies, pulse oximetry and re-evaluation of patient's condition.   Dione Booze, MD 06/14/20 [phone]

## 2020-06-13 NOTE — ED Notes (Signed)
Pt given a fresh blanket. Pt resting comfortably in bed.

## 2020-06-13 NOTE — ED Notes (Signed)
Lunch Tray Ordered @ 1029. 

## 2020-06-13 NOTE — BH Assessment (Signed)
TTS has requested nursing to place TTS machine in room for patient's TTS re-assessment.

## 2020-06-13 NOTE — ED Notes (Signed)
Lunch tray was given 

## 2020-06-13 NOTE — ED Notes (Signed)
ED Provider at bedside. 

## 2020-06-14 DIAGNOSIS — Z046 Encounter for general psychiatric examination, requested by authority: Secondary | ICD-10-CM | POA: Diagnosis not present

## 2020-06-14 NOTE — ED Notes (Signed)
ED Provider at bedside. 

## 2020-06-14 NOTE — Discharge Instructions (Addendum)
Transfer to [facility] via Patent examiner.

## 2020-06-14 NOTE — ED Notes (Signed)
Patient remains agitated somewhat and EDP at bedside and placed orders for more medication; pt became tearful and apologized for behavior and states she has a lot of stress on her regarding her parents; Pt states father killed self in [place] and she wanted to return to [place] to kill herself and join him; Comptroller and RN listened to patient and help to calm her-Monique,RN

## 2020-06-14 NOTE — ED Provider Notes (Signed)
Emergency Medicine Observation Re-evaluation Note  Pamela Graham is a 64 y.o. female, seen on rounds today.  Pt initially presented to the ED for complaints of IVC and Aggressive Behavior Currently, the patient is eating breakfast, is alert, conversant, and positive about going to [facility] this AM -  Pt accepted by Dr Loyola Mast with transport after 8 AM today.   Physical Exam  BP (!) 123/96 (BP Location: Left Arm)   Pulse 81   Temp 97.6 F (36.4 C) (Oral)   Resp 18   Ht 1.702 m (5\' 7" )   Wt 118 kg   SpO2 100%   BMI 40.74 kg/m  Physical Exam General: alert, content.  Cardiac: normal rate. Lungs: breathing comfortably. Psych: smiling, alert, cooperative.   ED Course / MDM  EKG:    I have reviewed the labs performed to date as well as medications administered while in observation.  Recent changes in the last 24 hours include reassessments, med adjustment. Pt with episode agitated behavior last pm - received meds for same. Has been calm, cooperative since.  Plan  Current plan is for transfer to [facility] this AM.   Pt currently appears stable for transport.    [facility], MD 06/14/20 [phone]

## 2020-06-14 NOTE — ED Notes (Signed)
Patient started to escalate and yell at staff because she wanted food; Staff explained snack hours and advised that cafe was not open at this time; Patient was offered water and snacks earlier and was advised one more snack would be given for the night; Pt started to call staff out of our names and slamming doors and throwing thing around in the room; Security called and EDP at bedside to assess; Emergency medication ordered and administered.-Monique,RN

## 2020-06-28 ENCOUNTER — Encounter (HOSPITAL_COMMUNITY): Payer: Self-pay

## 2020-06-28 ENCOUNTER — Emergency Department (HOSPITAL_COMMUNITY)
Admission: EM | Admit: 2020-06-28 | Discharge: 2020-06-29 | Disposition: A | Discharge status: Home / Self Care | Payer: Medicare HMO | Attending: Emergency Medicine | Admitting: Emergency Medicine

## 2020-06-28 DIAGNOSIS — J1282 Pneumonia due to coronavirus disease 2019: Secondary | ICD-10-CM | POA: Diagnosis not present

## 2020-06-28 DIAGNOSIS — Z79899 Other long term (current) drug therapy: Secondary | ICD-10-CM | POA: Diagnosis not present

## 2020-06-28 DIAGNOSIS — R55 Syncope and collapse: Secondary | ICD-10-CM | POA: Diagnosis not present

## 2020-06-28 DIAGNOSIS — I10 Essential (primary) hypertension: Secondary | ICD-10-CM | POA: Insufficient documentation

## 2020-06-28 DIAGNOSIS — U071 COVID-19: Secondary | ICD-10-CM | POA: Insufficient documentation

## 2020-06-28 DIAGNOSIS — Z87891 Personal history of nicotine dependence: Secondary | ICD-10-CM | POA: Diagnosis not present

## 2020-06-28 DIAGNOSIS — R059 Cough, unspecified: Secondary | ICD-10-CM | POA: Diagnosis present

## 2020-06-28 LAB — URINALYSIS, ROUTINE W REFLEX MICROSCOPIC
Bilirubin Urine: NEGATIVE
Glucose, UA: NEGATIVE mg/dL
Hgb urine dipstick: NEGATIVE
Ketones, ur: 5 mg/dL — AB
Nitrite: NEGATIVE
Protein, ur: 30 mg/dL — AB
Specific Gravity, Urine: 1.026 (ref 1.005–1.030)
pH: 5 (ref 5.0–8.0)

## 2020-06-28 LAB — BASIC METABOLIC PANEL
Anion gap: 11 (ref 5–15)
BUN: 11 mg/dL (ref 8–23)
CO2: 24 mmol/L (ref 22–32)
Calcium: 9.5 mg/dL (ref 8.9–10.3)
Chloride: 103 mmol/L (ref 98–111)
Creatinine, Ser: 0.95 mg/dL (ref 0.44–1.00)
GFR, Estimated: >60 mL/min (ref >60)
Glucose, Bld: 104 mg/dL — ABNORMAL HIGH (ref 70–99)
Potassium: 3.8 mmol/L (ref 3.5–5.1)
Sodium: 138 mmol/L (ref 135–145)

## 2020-06-28 LAB — RESP PANEL BY RT-PCR (FLU A&B, COVID) ARPGX2
Influenza A by PCR: NEGATIVE
Influenza B by PCR: NEGATIVE
SARS Coronavirus 2 by RT PCR: POSITIVE — AB

## 2020-06-28 LAB — CBC
HCT: 41.2 % (ref 36.0–46.0)
Hemoglobin: 13.7 g/dL (ref 12.0–15.0)
MCH: 31.5 pg (ref 26.0–34.0)
MCHC: 33.3 g/dL (ref 30.0–36.0)
MCV: 94.7 fL (ref 80.0–100.0)
Platelets: 239 10*3/uL (ref 150–400)
RBC: 4.35 MIL/uL (ref 3.87–5.11)
RDW: 13.5 % (ref 11.5–15.5)
WBC: 7.6 10*3/uL (ref 4.0–10.5)
nRBC: 0 % (ref 0.0–0.2)

## 2020-06-28 NOTE — ED Triage Notes (Signed)
Pt comes via [facility] for near syncopal episode while in the checkout line. Pt also having some CP. Pt states that she has also had a cough, sore throat sine Sunday

## 2020-06-29 ENCOUNTER — Emergency Department (HOSPITAL_COMMUNITY): Payer: Medicare HMO

## 2020-06-29 ENCOUNTER — Telehealth: Payer: Self-pay | Admitting: Adult Health

## 2020-06-29 ENCOUNTER — Other Ambulatory Visit (HOSPITAL_COMMUNITY): Payer: Self-pay | Admitting: Emergency Medicine

## 2020-06-29 DIAGNOSIS — U071 COVID-19: Secondary | ICD-10-CM

## 2020-06-29 MED ORDER — SODIUM CHLORIDE 0.9 % IV SOLN
200.0000 mg | Freq: Once | INTRAVENOUS | Status: AC
  Administered 2020-06-29: 200 mg via INTRAVENOUS
  Filled 2020-06-29: qty 40

## 2020-06-29 MED ORDER — SODIUM CHLORIDE 0.9 % IV SOLN: 100.0000 mg | Freq: Every day | INTRAVENOUS | Status: DC

## 2020-06-29 NOTE — ED Provider Notes (Signed)
Patient signed out to finish remdesivir infusion.  After infusion, vital signs reviewed and are reassuring.  She has been referred to outpatient infusions per Dr. Bebe Shaggy.  Will be discharged per his discharge instructions.  After history, exam, and medical workup I feel the patient has been appropriately medically screened and is safe for discharge home. Pertinent diagnoses were discussed with the patient. Patient was given return precautions.  Pamela Graham was evaluated in Emergency Department on 06/29/2020 for the symptoms described in the history of present illness. She was evaluated in the context of the global COVID-19 pandemic, which necessitated consideration that the patient might be at risk for infection with the SARS-CoV-2 virus that causes COVID-19. Institutional protocols and algorithms that pertain to the evaluation of patients at risk for COVID-19 are in a state of rapid change based on information released by regulatory bodies including the CDC and federal and state organizations. These policies and algorithms were followed during the patient's care in the ED.    Shon Baton, MD 06/29/20 [phone]

## 2020-06-29 NOTE — Telephone Encounter (Signed)
Called to discuss with patient about COVID-19 symptoms and the use of one of the available treatments for those with mild to moderate Covid symptoms and at a high risk of hospitalization.  Pt appears to qualify for outpatient treatment due to co-morbid conditions and/or a member of an at-risk group in accordance with the FDA Emergency Use Authorization.    Called to set up 2nd and 3rd Remdesivir Infusions . Received first one in the ER.  Unable to reach pt - no voicemail, no my chart.  Will attempt to call later.   Rubye Oaks NP

## 2020-06-29 NOTE — ED Provider Notes (Signed)
[facility] EMERGENCY DEPARTMENT Provider Note   CSN: [id number] Arrival date & time: 06/28/20  1912     History Chief Complaint  Patient presents with  . Near Syncope    Pamela Graham is a 65 y.o. female.  The history is provided by the patient.  Cough Severity:  Moderate Onset quality:  Gradual Duration:  5 days Timing:  Intermittent Progression:  Worsening Chronicity:  New Relieved by:  Nothing Worsened by:  Nothing Associated symptoms: chills    Patient with history of fibromyalgia, bipolar presents with cough and congestion.  She reports up to 5 days ago began having cough sore throat and congestion   She also reports headache.  She was at [facility] earlier in the day when she felt lightheaded and had a syncopal episode.  She denies any traumatic injury from the fall Past Medical History:  Diagnosis Date  . Barrett's esophagus   . DDD (degenerative disc disease), lumbar   . Fibromyalgia   . Gastric ulcer   . Hypertension   . Migraines     Patient Active Problem List   Diagnosis Date Noted  . Bipolar 1 disorder (HCC) 11/20/2018  . Moderate bipolar I disorder, most recent episode depressed (HCC)   . 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 ABDOMINAL HYSTERECTOMY    . COLONOSCOPY    . UPPER GI ENDOSCOPY       OB History   No obstetric history on file.     No family history on file.  Social History   Tobacco Use  . Smoking status: Former Games developer  . Smokeless tobacco: Never Used  Vaping Use  . Vaping Use: Never used  Substance Use Topics  . Alcohol use: No  . Drug use: Yes    Types: Marijuana    Comment: Last used: Today.     Home Medications Prior to Admission medications   Medication Sig Start Date End Date Taking? Authorizing Provider  carbamazepine (TEGRETOL XR) 200 MG 12 hr tablet Take 200 mg by mouth 2 (two) times  daily.    [provider]  carbamazepine (TEGRETOL) 100 MG chewable tablet Chew 1 tablet (100 mg total) by mouth 2 (two) times a day. Patient not taking: No sig reported 11/25/18   Malvin Johns, MD  clindamycin (CLEOCIN) 300 MG capsule Take 1 capsule (300 mg total) by mouth 3 (three) times daily. 01/20/19   Mickie Bail, NP  clonazePAM (KLONOPIN) 0.5 MG tablet Take 0.5 tablets (0.25 mg total) by mouth 2 (two) times daily. Patient taking differently: Take 0.5 mg by mouth 3 (three) times daily as needed for anxiety. 11/25/18   Malvin Johns, MD  cyclobenzaprine (FLEXERIL) 5 MG tablet Take 1 tablet (5 mg total) by mouth 3 (three) times daily. Patient taking differently: Take 5 mg by mouth daily. 11/25/18   Malvin Johns, MD  DULoxetine (CYMBALTA) 30 MG capsule Take 90 mg by mouth daily.    [provider]  hydrochlorothiazide (HYDRODIURIL) 25 MG tablet Take 1 tablet (25 mg total) by mouth daily. Patient not taking: No sig reported 08/23/17   Money, Gerlene Burdock, FNP  HYDROcodone-acetaminophen (NORCO/VICODIN) 5-325 MG tablet Take 2 tablets by mouth every 4 (four) hours as needed. Patient not taking: No sig reported 01/20/19   Mickie Bail, NP  metoprolol succinate (TOPROL-XL) 25 MG 24 hr tablet Take 25 mg by mouth daily. 05/25/20  [provider]  omeprazole (PRILOSEC) 40 MG capsule Take 1 capsule by mouth daily. 11/20/18   [provider]  perphenazine (TRILAFON) 4 MG tablet 1 in am 2 at h s Patient not taking: No sig reported 11/25/18   Malvin Johns, MD  prazosin (MINIPRESS) 2 MG capsule Take 2 capsules (4 mg total) by mouth at bedtime. Patient not taking: No sig reported 11/25/18   Malvin Johns, MD  prazosin (MINIPRESS) 5 MG capsule Take 5 mg by mouth at bedtime.    [provider]  temazepam (RESTORIL) 30 MG capsule Take 1 capsule (30 mg total) by mouth at bedtime. Patient not taking: No sig reported 11/25/18   Malvin Johns, MD  zolpidem (AMBIEN) 10 MG tablet Take 10  mg by mouth at bedtime as needed. 05/25/20   [provider]    Allergies    Lisinopril, Gabapentin, Haloperidol, Ibuprofen, Meloxicam, Penicillins, Pregabalin, Risperidone and related, and Tramadol  Review of Systems   Review of Systems  Constitutional: Positive for chills and fatigue.  HENT: Positive for congestion.   Respiratory: Positive for cough.   Neurological: Positive for syncope.  All other systems reviewed and are negative.   Physical Exam Updated Vital Signs BP 138/87 (BP Location: Right Arm)   Pulse 77   Temp 98.5 F (36.9 C) (Oral)   Resp 16   SpO2 99%   Physical Exam CONSTITUTIONAL: Well developed/well nourished, mildly anxious HEAD: Normocephalic/atraumatic EYES: EOMI/PERRL ENMT: Mucous membranes moist NECK: supple no meningeal signs SPINE/BACK: No bruising/crepitance/stepoffs noted to spine CV: S1/S2 noted, no murmurs/rubs/gallops noted LUNGS: Lungs are clear to auscultation bilaterally, no apparent distress ABDOMEN: soft, nontender, no rebound or guarding, bowel sounds noted throughout abdomen GU:no cva tenderness NEURO: Pt is awake/alert/appropriate, moves all extremitiesx4.  No facial droop.   EXTREMITIES: pulses normal/equal, full ROM, patient is wearing 1 glove All other extremities/joints palpated/ranged and nontender SKIN: warm, color normal PSYCH: Anxious  ED Results / Procedures / Treatments   Labs (all labs ordered are listed, but only abnormal results are displayed) Labs Reviewed  RESP PANEL BY RT-PCR (FLU A&B, COVID) ARPGX2 - Abnormal; Notable for the following components:      Result Value   SARS Coronavirus 2 by RT PCR POSITIVE (*)    All other components within normal limits  BASIC METABOLIC PANEL - Abnormal; Notable for the following components:   Glucose, Bld 104 (*)    All other components within normal limits  URINALYSIS, ROUTINE W REFLEX MICROSCOPIC - Abnormal; Notable for the following components:   Color, Urine AMBER  (*)    APPearance HAZY (*)    Ketones, ur 5 (*)    Protein, ur 30 (*)    Leukocytes,Ua SMALL (*)    Bacteria, UA RARE (*)    All other components within normal limits  CBC    EKG  ED ECG REPORT   Date: 06/28/2020 1911  Rate: 93  Rhythm: normal sinus rhythm  QRS Axis: normal  Intervals: normal  ST/T Wave abnormalities: nonspecific ST changes  Conduction Disutrbances:none  Narrative Interpretation:     I have personally reviewed the EKG tracing and agree with the computerized printout as noted.  Radiology DG Chest Port 1 View  Result Date: 06/29/2020 CLINICAL DATA:  Cough, near syncopal episode, chest pain, COVID positive EXAM: PORTABLE CHEST 1 VIEW COMPARISON:  Radiograph 08/26/2017 FINDINGS: Low lung volumes. Some mixed interstitial and hazy opacities present the mid to lower lungs. No pneumothorax. No effusion. The aorta is  calcified. The remaining cardiomediastinal contours are unremarkable. No acute osseous or soft tissue abnormality. Degenerative changes are present in the imaged spine and shoulders. Telemetry leads overlie the chest. IMPRESSION: Mixed interstitial and hazy opacities present in the mid to lower lungs, may reflect early or atypical infection in the setting of COVID-19 positivity versus atelectasis on a background of low volumes. Aortic Atherosclerosis (ICD10-I70.0). Electronically Signed   By: Kreg Shropshire M.D.   On: 06/29/2020 05:36    Procedures Procedures  Medications Ordered in ED Medications  remdesivir 200 mg in sodium chloride 0.9% 250 mL IVPB (has no administration in time range)    Followed by  remdesivir 100 mg in sodium chloride 0.9 % 100 mL IVPB (has no administration in time range)    ED Course  I have reviewed the triage vital signs and the nursing notes.  Pertinent labs & imaging results that were available during my care of the patient were reviewed by me and considered in my medical decision making (see chart for details).    MDM  Rules/Calculators/A&P                          6:09 AM Patient overall well-appearing.  She has been having symptoms of cough and congestion is found to be positive for COVID-19.  She had a syncopal episode while waiting in line at [facility].  EKG is unremarkable.  X-ray reveals changes of COVID, but she has no hypoxia Plan to give remdesivir and then discharge home 7:26 AM Signed out to Dr Wilkie Aye at shift change Plan to d/c after remdesivir  Octavia Velador was evaluated in Emergency Department on 06/29/2020 for the symptoms described in the history of present illness. She was evaluated in the context of the global COVID-19 pandemic, which necessitated consideration that the patient might be at risk for infection with the SARS-CoV-2 virus that causes COVID-19. Institutional protocols and algorithms that pertain to the evaluation of patients at risk for COVID-19 are in a state of rapid change based on information released by regulatory bodies including the CDC and federal and state organizations. These policies and algorithms were followed during the patient's care in the ED.  Final Clinical Impression(s) / ED Diagnoses Final diagnoses:  Syncope and collapse  Pneumonia due to COVID-19 virus    Rx / DC Orders ED Discharge Orders    None       Zadie Rhine, MD 06/29/20 [phone]

## 2020-06-29 NOTE — Discharge Instructions (Addendum)
You were seen today and found to have COVID-19.  You were referred for outpatient infusions.  You will receive a phone call.  If you develop any new or worsening symptoms you should be reevaluated.

## 2020-06-30 ENCOUNTER — Telehealth (HOSPITAL_COMMUNITY): Payer: Self-pay

## 2020-06-30 NOTE — Telephone Encounter (Signed)
RN attempted to contact patient to scheduled remaining outpatient remdesivir IV infusions for the treatment of COVID. Unable to leave VM, mailbox full.

## 2020-07-02 ENCOUNTER — Other Ambulatory Visit: Payer: Self-pay

## 2020-07-02 ENCOUNTER — Emergency Department (HOSPITAL_COMMUNITY)
Admission: EM | Admit: 2020-07-02 | Discharge: 2020-07-03 | Disposition: A | Discharge status: Home / Self Care | Payer: Medicare HMO | Attending: Emergency Medicine | Admitting: Emergency Medicine

## 2020-07-02 ENCOUNTER — Encounter (HOSPITAL_COMMUNITY): Payer: Self-pay | Admitting: Emergency Medicine

## 2020-07-02 ENCOUNTER — Emergency Department (HOSPITAL_COMMUNITY): Payer: Medicare HMO

## 2020-07-02 DIAGNOSIS — Z79899 Other long term (current) drug therapy: Secondary | ICD-10-CM | POA: Diagnosis not present

## 2020-07-02 DIAGNOSIS — R0789 Other chest pain: Secondary | ICD-10-CM

## 2020-07-02 DIAGNOSIS — Z87891 Personal history of nicotine dependence: Secondary | ICD-10-CM | POA: Insufficient documentation

## 2020-07-02 DIAGNOSIS — U071 COVID-19: Secondary | ICD-10-CM | POA: Diagnosis not present

## 2020-07-02 DIAGNOSIS — I10 Essential (primary) hypertension: Secondary | ICD-10-CM | POA: Insufficient documentation

## 2020-07-02 DIAGNOSIS — R55 Syncope and collapse: Secondary | ICD-10-CM

## 2020-07-02 LAB — BASIC METABOLIC PANEL
Anion gap: 10 (ref 5–15)
BUN: 23 mg/dL (ref 8–23)
CO2: 20 mmol/L — ABNORMAL LOW (ref 22–32)
Calcium: 9.1 mg/dL (ref 8.9–10.3)
Chloride: 110 mmol/L (ref 98–111)
Creatinine, Ser: 1.01 mg/dL — ABNORMAL HIGH (ref 0.44–1.00)
GFR, Estimated: >60 mL/min (ref >60)
Glucose, Bld: 101 mg/dL — ABNORMAL HIGH (ref 70–99)
Potassium: 4.1 mmol/L (ref 3.5–5.1)
Sodium: 140 mmol/L (ref 135–145)

## 2020-07-02 LAB — CBC
HCT: 37.8 % (ref 36.0–46.0)
Hemoglobin: 12.3 g/dL (ref 12.0–15.0)
MCH: 31.1 pg (ref 26.0–34.0)
MCHC: 32.5 g/dL (ref 30.0–36.0)
MCV: 95.5 fL (ref 80.0–100.0)
Platelets: 227 10*3/uL (ref 150–400)
RBC: 3.96 MIL/uL (ref 3.87–5.11)
RDW: 13.4 % (ref 11.5–15.5)
WBC: 7.8 10*3/uL (ref 4.0–10.5)
nRBC: 0 % (ref 0.0–0.2)

## 2020-07-02 NOTE — ED Triage Notes (Addendum)
Pt to triage via [facility] from home.  States she had a syncopal episode last night and fell at home on hardwood floor.  Reports dizziness prior to fall and now.  Reports R sided rib pain that increases with movement and deep inspiration.  Also reports SOB and nausea.  Pt seen in ED on 1/12 after syncopal episode.  COVID + 1/12.

## 2020-07-03 ENCOUNTER — Emergency Department (HOSPITAL_COMMUNITY): Payer: Medicare HMO

## 2020-07-03 LAB — COMPREHENSIVE METABOLIC PANEL
ALT: 35 U/L (ref 0–44)
AST: 28 U/L (ref 15–41)
Albumin: 3.5 g/dL (ref 3.5–5.0)
Alkaline Phosphatase: 92 U/L (ref 38–126)
Anion gap: 11 (ref 5–15)
BUN: 13 mg/dL (ref 8–23)
CO2: 23 mmol/L (ref 22–32)
Calcium: 9 mg/dL (ref 8.9–10.3)
Chloride: 106 mmol/L (ref 98–111)
Creatinine, Ser: 0.91 mg/dL (ref 0.44–1.00)
GFR, Estimated: >60 mL/min (ref >60)
Glucose, Bld: 79 mg/dL (ref 70–99)
Potassium: 3.9 mmol/L (ref 3.5–5.1)
Sodium: 140 mmol/L (ref 135–145)
Total Bilirubin: 0.4 mg/dL (ref 0.3–1.2)
Total Protein: 6.9 g/dL (ref 6.5–8.1)

## 2020-07-03 LAB — CBC WITH DIFFERENTIAL/PLATELET
Abs Immature Granulocytes: 0.02 10*3/uL (ref 0.00–0.07)
Basophils Absolute: 0 10*3/uL (ref 0.0–0.1)
Basophils Relative: 0 %
Eosinophils Absolute: 0.1 10*3/uL (ref 0.0–0.5)
Eosinophils Relative: 1 %
HCT: 40.9 % (ref 36.0–46.0)
Hemoglobin: 13 g/dL (ref 12.0–15.0)
Immature Granulocytes: 0 %
Lymphocytes Relative: 31 %
Lymphs Abs: 2.2 10*3/uL (ref 0.7–4.0)
MCH: 30.6 pg (ref 26.0–34.0)
MCHC: 31.8 g/dL (ref 30.0–36.0)
MCV: 96.2 fL (ref 80.0–100.0)
Monocytes Absolute: 0.8 10*3/uL (ref 0.1–1.0)
Monocytes Relative: 11 %
Neutro Abs: 3.9 10*3/uL (ref 1.7–7.7)
Neutrophils Relative %: 57 %
Platelets: 196 10*3/uL (ref 150–400)
RBC: 4.25 MIL/uL (ref 3.87–5.11)
RDW: 13.6 % (ref 11.5–15.5)
WBC: 7 10*3/uL (ref 4.0–10.5)
nRBC: 0 % (ref 0.0–0.2)

## 2020-07-03 LAB — TROPONIN I (HIGH SENSITIVITY)
Troponin I (High Sensitivity): <2 ng/L (ref <18)
Troponin I (High Sensitivity): 3 ng/L (ref <18)

## 2020-07-03 LAB — LACTIC ACID, PLASMA
Lactic Acid, Venous: 1.2 mmol/L (ref 0.5–1.9)
Lactic Acid, Venous: 0.8 mmol/L (ref 0.5–1.9)

## 2020-07-03 LAB — TSH: TSH: 2.4 u[IU]/mL (ref 0.350–4.500)

## 2020-07-03 LAB — MAGNESIUM: Magnesium: 2.1 mg/dL (ref 1.7–2.4)

## 2020-07-03 MED ORDER — CYCLOBENZAPRINE HCL 5 MG PO TABS: 5.0000 mg | ORAL_TABLET | Freq: Two times a day (BID) | ORAL | 0 refills | Status: DC | PRN

## 2020-07-03 MED ORDER — PROMETHAZINE HCL 25 MG/ML IJ SOLN
25.0000 mg | Freq: Once | INTRAMUSCULAR | Status: AC
  Administered 2020-07-03: 25 mg via INTRAVENOUS
  Filled 2020-07-03: qty 1

## 2020-07-03 MED ORDER — FENTANYL CITRATE (PF) 100 MCG/2ML IJ SOLN
50.0000 ug | Freq: Once | INTRAMUSCULAR | Status: AC
  Administered 2020-07-03: 50 ug via INTRAVENOUS
  Filled 2020-07-03: qty 2

## 2020-07-03 MED ORDER — LIDOCAINE 5 % EX PTCH: 1.0000 | MEDICATED_PATCH | CUTANEOUS | 0 refills | Status: DC

## 2020-07-03 MED ORDER — IOHEXOL 350 MG/ML SOLN
75.0000 mL | Freq: Once | INTRAVENOUS | Status: AC | PRN
  Administered 2020-07-03: 75 mL via INTRAVENOUS

## 2020-07-03 NOTE — Discharge Instructions (Addendum)
Your work-up today was overall reassuring.  Your CT scan did not show any evidence of a rib fracture, pulmonary contusion, blood clot, or pneumonia.  As we discussed, I suspect your chest wall pain is likely from the fall into the ottoman in the floor.  We did offer you admission as you have had 2 episodes of passing out or syncope over the last week however, as you are feeling better and your work-up was otherwise reassuring, you wanted to go home instead.  Please rest and stay hydrated and the pain may also be related to your recent COVID diagnosis.  As I suspect your symptoms are chest wall related, we gave you prescription for a muscle relaxant which you have taken in the past and a Lidoderm patch to help place right where it is hurting.  Please follow-up with your primary doctor in several days.  If any symptoms change or worsen, please return to the nearest emergency department immediately.Marland Kitchen

## 2020-07-03 NOTE — ED Notes (Signed)
Patient transported to CT 

## 2020-07-03 NOTE — ED Notes (Signed)
Pt screaming out in lobby. Pt asking for social work and pt stated that she is leaving. Pt stated that she is leaving.

## 2020-07-03 NOTE — ED Provider Notes (Signed)
[facility] EMERGENCY DEPARTMENT Provider Note   CSN: [id number] Arrival date & time: 07/02/20  1443     History Chief Complaint  Patient presents with  . Fall    Rib pain     Pamela Graham is a 65 y.o. female.  The history is provided by the patient and medical records. No language interpreter was used.  Loss of Consciousness Episode history:  Multiple Most recent episode:  Yesterday Timing:  Sporadic Progression:  Unchanged Chronicity:  New Witnessed: yes   Relieved by:  Nothing Worsened by:  Nothing Ineffective treatments:  None tried Associated symptoms: chest pain, malaise/fatigue, nausea, shortness of breath and vomiting   Associated symptoms: no anxiety, no confusion, no diaphoresis, no dizziness, no fever, no headaches, no palpitations, no recent injury, no recent surgery and no weakness        Past Medical History:  Diagnosis Date  . Barrett's esophagus   . DDD (degenerative disc disease), lumbar   . Fibromyalgia   . Gastric ulcer   . Hypertension   . Migraines     Patient Active Problem List   Diagnosis Date Noted  . Bipolar 1 disorder (HCC) 11/20/2018  . Moderate bipolar I disorder, most recent episode depressed (HCC)   . 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 ABDOMINAL HYSTERECTOMY    . COLONOSCOPY    . UPPER GI ENDOSCOPY       OB History   No obstetric history on file.     No family history on file.  Social History   Tobacco Use  . Smoking status: Former Games developer  . Smokeless tobacco: Never Used  Vaping Use  . Vaping Use: Never used  Substance Use Topics  . Alcohol use: No  . Drug use: Yes    Types: Marijuana    Comment: Last used: Today.     Home Medications Prior to Admission medications   Medication Sig Start Date End Date Taking? Authorizing Provider  carbamazepine (TEGRETOL XR) 200 MG 12 hr tablet  Take 200 mg by mouth 2 (two) times daily.    [provider]  carbamazepine (TEGRETOL) 100 MG chewable tablet Chew 1 tablet (100 mg total) by mouth 2 (two) times a day. Patient not taking: No sig reported 11/25/18   Malvin Johns, MD  clindamycin (CLEOCIN) 300 MG capsule Take 1 capsule (300 mg total) by mouth 3 (three) times daily. 01/20/19   Mickie Bail, NP  clonazePAM (KLONOPIN) 0.5 MG tablet Take 0.5 tablets (0.25 mg total) by mouth 2 (two) times daily. Patient taking differently: Take 0.5 mg by mouth 3 (three) times daily as needed for anxiety. 11/25/18   Malvin Johns, MD  cyclobenzaprine (FLEXERIL) 5 MG tablet Take 1 tablet (5 mg total) by mouth 3 (three) times daily. Patient taking differently: Take 5 mg by mouth daily. 11/25/18   Malvin Johns, MD  DULoxetine (CYMBALTA) 30 MG capsule Take 90 mg by mouth daily.    [provider]  hydrochlorothiazide (HYDRODIURIL) 25 MG tablet Take 1 tablet (25 mg total) by mouth daily. Patient not taking: No sig reported 08/23/17   Money, Gerlene Burdock, FNP  HYDROcodone-acetaminophen (NORCO/VICODIN) 5-325 MG tablet Take 2 tablets by mouth every 4 (four) hours as needed. Patient not taking: No sig reported 01/20/19   Mickie Bail, NP  metoprolol succinate (TOPROL-XL) 25 MG 24 hr tablet Take 25 mg by mouth daily. 05/25/20  [provider]  omeprazole (PRILOSEC) 40 MG capsule Take 1 capsule by mouth daily. 11/20/18   [provider]  perphenazine (TRILAFON) 4 MG tablet 1 in am 2 at h s Patient not taking: No sig reported 11/25/18   Malvin Johns, MD  prazosin (MINIPRESS) 2 MG capsule Take 2 capsules (4 mg total) by mouth at bedtime. Patient not taking: No sig reported 11/25/18   Malvin Johns, MD  prazosin (MINIPRESS) 5 MG capsule Take 5 mg by mouth at bedtime.    [provider]  temazepam (RESTORIL) 30 MG capsule Take 1 capsule (30 mg total) by mouth at bedtime. Patient not taking: No sig reported 11/25/18   Malvin Johns, MD   zolpidem (AMBIEN) 10 MG tablet Take 10 mg by mouth at bedtime as needed. 05/25/20   [provider]    Allergies    Lisinopril, Gabapentin, Haloperidol, Ibuprofen, Meloxicam, Penicillins, Pregabalin, Risperidone and related, and Tramadol  Review of Systems   Review of Systems  Constitutional: Positive for fatigue and malaise/fatigue. Negative for chills, diaphoresis and fever.  HENT: Negative for congestion.   Eyes: Negative for visual disturbance.  Respiratory: Positive for cough, chest tightness and shortness of breath. Negative for wheezing.   Cardiovascular: Positive for chest pain and syncope. Negative for palpitations and leg swelling.  Gastrointestinal: Positive for diarrhea, nausea and vomiting. Negative for abdominal pain and constipation.  Genitourinary: Negative for dysuria, flank pain and frequency.  Musculoskeletal: Negative for back pain, neck pain and neck stiffness.  Skin: Negative for wound.  Neurological: Positive for syncope and light-headedness. Negative for dizziness, facial asymmetry, weakness, numbness and headaches.  Psychiatric/Behavioral: Negative for agitation and confusion.  All other systems reviewed and are negative.   Physical Exam Updated Vital Signs BP 120/78   Pulse 80   Temp 98 F (36.7 C) (Oral)   Resp 17   SpO2 98%   Physical Exam Vitals and nursing note reviewed.  Constitutional:      General: She is not in acute distress.    Appearance: She is well-developed and well-nourished. She is not ill-appearing, toxic-appearing or diaphoretic.  HENT:     Head: Normocephalic and atraumatic.     Nose: No congestion.     Mouth/Throat:     Mouth: Mucous membranes are dry.     Pharynx: No oropharyngeal exudate or posterior oropharyngeal erythema.  Eyes:     Extraocular Movements: Extraocular movements intact.     Conjunctiva/sclera: Conjunctivae normal.     Pupils: Pupils are equal, round, and reactive to light.  Cardiovascular:      Rate and Rhythm: Normal rate and regular rhythm.     Heart sounds: No murmur heard.   Pulmonary:     Effort: Pulmonary effort is normal. No respiratory distress.     Breath sounds: Normal breath sounds. No wheezing, rhonchi or rales.  Chest:     Chest wall: Tenderness present.  Abdominal:     General: Abdomen is flat.     Palpations: Abdomen is soft.     Tenderness: There is no abdominal tenderness. There is no right CVA tenderness, left CVA tenderness, guarding or rebound.  Musculoskeletal:        General: No tenderness or edema.     Cervical back: Neck supple. No tenderness.  Skin:    General: Skin is warm and dry.     Capillary Refill: Capillary refill takes less than 2 seconds.     Findings: No erythema.  Neurological:  General: No focal deficit present.     Mental Status: She is alert.  Psychiatric:        Mood and Affect: Mood and affect and mood normal.     ED Results / Procedures / Treatments   Labs (all labs ordered are listed, but only abnormal results are displayed) Labs Reviewed  BASIC METABOLIC PANEL - Abnormal; Notable for the following components:      Result Value   CO2 20 (*)    Glucose, Bld 101 (*)    Creatinine, Ser 1.01 (*)    All other components within normal limits  CBC  CBC WITH DIFFERENTIAL/PLATELET  COMPREHENSIVE METABOLIC PANEL  LACTIC ACID, PLASMA  LACTIC ACID, PLASMA  MAGNESIUM  TSH  URINALYSIS, ROUTINE W REFLEX MICROSCOPIC  CBG MONITORING, ED  TROPONIN I (HIGH SENSITIVITY)  TROPONIN I (HIGH SENSITIVITY)    EKG None  Radiology CT Angio Chest PE W and/or Wo Contrast  Result Date: 07/03/2020 CLINICAL DATA:  COVID positive with syncopal episodes, shortness of breath and chest pain. EXAM: CT ANGIOGRAPHY CHEST WITH CONTRAST TECHNIQUE: Multidetector CT imaging of the chest was performed using the standard protocol during bolus administration of intravenous contrast. Multiplanar CT image reconstructions and MIPs were obtained to  evaluate the vascular anatomy. CONTRAST:  75mL OMNIPAQUE IOHEXOL 350 MG/ML SOLN COMPARISON:  None. FINDINGS: Cardiovascular: There is mild calcification of the aortic arch. Satisfactory opacification of the pulmonary arteries to the segmental level. No evidence of pulmonary embolism. Normal heart size. No pericardial effusion. Mediastinum/Nodes: No enlarged mediastinal, hilar, or axillary lymph nodes. Thyroid gland, trachea, and esophagus demonstrate no significant findings. Lungs/Pleura: Mild atelectasis is seen along the posterior aspects of the bilateral lower lobes. There is no evidence of a pleural effusion or pneumothorax. Upper Abdomen: No acute abnormality. Musculoskeletal: No chest wall abnormality. No acute or significant osseous findings. Review of the MIP images confirms the above findings. IMPRESSION: 1. No CT evidence of pulmonary embolism. 2. Mild bilateral lower lobe atelectasis. 3. Aortic atherosclerosis. Aortic Atherosclerosis (ICD10-I70.0). Electronically Signed   By: Aram Candelahaddeus  Houston M.D.   On: 07/03/2020 14:59   DG Chest Portable 1 View  Result Date: 07/02/2020 CLINICAL DATA:  Fall with shortness of breath and right rib pain. EXAM: PORTABLE CHEST 1 VIEW COMPARISON:  Chest radiograph 06/29/2020 FINDINGS: Stable cardiomediastinal contours with normal heart size. Low lung volumes. Redemonstrated mild subtle interstitial opacities in the bilateral lung bases, possibly atelectasis. No new focal consolidation. No pneumothorax or significant pleural effusion. No evidence of a displaced rib fracture. IMPRESSION: 1. Low lung volumes with mild bibasilar opacities, similar to prior, favor atelectasis. 2. No evidence of a displaced rib fracture or pneumothorax. Electronically Signed   By: Emmaline KluverNancy  Ballantyne M.D.   On: 07/02/2020 15:26    Procedures Procedures (including critical care time)  Medications Ordered in ED Medications  promethazine (PHENERGAN) injection 25 mg (25 mg Intravenous Given  07/03/20 1254)  fentaNYL (SUBLIMAZE) injection 50 mcg (50 mcg Intravenous Given 07/03/20 1254)  iohexol (OMNIPAQUE) 350 MG/ML injection 75 mL (75 mLs Intravenous Contrast Given 07/03/20 1452)    ED Course  I have reviewed the triage vital signs and the nursing notes.  Pertinent labs & imaging results that were available during my care of the patient were reviewed by me and considered in my medical decision making (see chart for details).    MDM Rules/Calculators/A&P  Lileigh Fahringer is a 65 y.o. female with a past medical history significant for hypertension, fibromyalgia, migraines, gastric ulcer, hysterectomy, Barrett's esophagus, and degenerative disease who presents with multiple syncopal episodes and right-sided chest pain.  Patient reports that she had a syncopal episode 5 days ago and was evaluated and found to be positive for COVID.  She says that she was pushing a shopping cart at [facility] when she passed out prompting that evaluation.  She went home and his continued to have some shortness of breath and URI symptoms with her COVID as well as nausea, vomiting, diarrhea.  She says that yesterday, while sitting on the couch at home, she had a syncopal episode passing out laying on the floor and developed a severe pain in her right chest.  She reports it is under her right breast and is extremely severe.  It is very pleuritic and she cannot take a deep breath or cough without having severe pain.  She reports some shortness of breath with it.  She reports this is a new pain.  She reports like the pain is "worse than giving birth".  She reports she did not hit her head and denies any headache or neck pain at this time.  Denies any other back pains.  Pain is primarily on her right lateral and anterior chest.  She reports no abdominal pain at this time.  No other complaints.  She does say that many women in her family have had blood clots and some have died from DVTs and PEs.  She  denies new leg pain or leg swelling over her mild chronic edema that responds to elevation.  On exam, patient does have tenderness in her right chest.  Lungs were clear.  Abdomen was nontender.  Normal bowel sounds.  Patient has pulses in all extremities.  Legs are nontender and nonedematous.  Of note, patient waited approximately 21 hours prior to my initial evaluation.  EKG from overnight shows no STEMI or significant arrhythmia.  Patient had some labs and a chest x-ray which did not show a pneumothorax or rib fractures on the x-ray.  Some consolidations are seen consistent with her prior COVID diagnosis.  She also had some labs showing no elevation in her kidney function or liver function.  Electrolytes all similar to prior.  Clinically I am concerned about the patient having 2 episodes of syncope this week.  This last episode she reported no palpitations or chest pain preceding and just suddenly passed out while at rest.  With her COVID diagnosis and this pleuritic discomfort, I do feel need to rule out pulmonary embolism.  We will get a CT PE study and get some repeat labs as it was yesterday when labs were obtained.  Anticipate reassessment after work-up to determine disposition.   3:32 PM Patient's work-up returned and CT PE study shows no evidence of pulmonary embolism, pneumothorax, pulmonary contusion, or rib fractures.  Other labs also reassuring.  Delta troponin is negative.  Had a long conversation with the patient.  She thinks the pain is more because she fell onto an ottoman in the floor as opposed to other etiologies.  We discussed her being admitted for multiple syncopal episodes however she would rather go home.  Patient did request something to help with the muscle pain in her chest we will give her a muscle relaxant and a Lidoderm patch.  Patient agrees with this plan.  She will follow-up with her PCP in several days and understands return precautions.  She no other questions  or concerns and was discharged in good condition.   Final Clinical Impression(s) / ED Diagnoses Final diagnoses:  Right-sided chest wall pain  Syncope, unspecified syncope type    Rx / DC Orders ED Discharge Orders         Ordered    lidocaine (LIDODERM) 5 %  Every 24 hours        07/03/20 1535    cyclobenzaprine (FLEXERIL) 5 MG tablet  2 times daily PRN        07/03/20 1535         Clinical Impression: 1. Right-sided chest wall pain   2. Syncope, unspecified syncope type     Disposition: Discharge  Condition: Good  I have discussed the results, Dx and Tx plan with the pt(& family if present). He/she/they expressed understanding and agree(s) with the plan. Discharge instructions discussed at great length. Strict return precautions discussed and pt &/or family have verbalized understanding of the instructions. No further questions at time of discharge.    New Prescriptions   CYCLOBENZAPRINE (FLEXERIL) 5 MG TABLET    Take 1 tablet (5 mg total) by mouth 2 (two) times daily as needed for muscle spasms.   LIDOCAINE (LIDODERM) 5 %    Place 1 patch onto the skin daily. Remove & Discard patch within 12 hours or as directed by MD    Follow Up: Malka SoJobe, Daniel B., MD     [facility] [address] [id number] [place] [id number] [phone]    Your cardiologist        Keaston Pile, Canary Brimhristopher J, MD 07/03/20 [phone]

## 2020-10-11 ENCOUNTER — Institutional Professional Consult (permissible substitution): Payer: Medicare HMO | Admitting: Plastic Surgery

## 2021-02-17 ENCOUNTER — Emergency Department (HOSPITAL_COMMUNITY)
Admission: EM | Admit: 2021-02-17 | Discharge: 2021-02-18 | Disposition: A | Discharge status: Home / Self Care | Payer: Medicare HMO | Attending: Emergency Medicine | Admitting: Emergency Medicine

## 2021-02-17 ENCOUNTER — Other Ambulatory Visit: Payer: Self-pay

## 2021-02-17 ENCOUNTER — Encounter (HOSPITAL_COMMUNITY): Payer: Self-pay | Admitting: Emergency Medicine

## 2021-02-17 DIAGNOSIS — K0889 Other specified disorders of teeth and supporting structures: Secondary | ICD-10-CM

## 2021-02-17 DIAGNOSIS — I10 Essential (primary) hypertension: Secondary | ICD-10-CM | POA: Diagnosis not present

## 2021-02-17 DIAGNOSIS — Z87891 Personal history of nicotine dependence: Secondary | ICD-10-CM | POA: Insufficient documentation

## 2021-02-17 MED ORDER — OXYCODONE-ACETAMINOPHEN 5-325 MG PO TABS
1.0000 | ORAL_TABLET | Freq: Once | ORAL | Status: AC
Start: 2021-02-17 — End: 2021-02-17
  Administered 2021-02-17: 1 via ORAL
  Filled 2021-02-17: qty 1

## 2021-02-17 NOTE — ED Triage Notes (Signed)
Pt c/o right sided mouth pain since last night.  "I've taken so much tylenol last night.... used ice to help too."

## 2021-02-17 NOTE — ED Notes (Signed)
Pt called several times to triage, no answer.

## 2021-02-17 NOTE — ED Provider Notes (Signed)
Emergency Medicine Provider Triage Evaluation Note  Pamela Graham , a 65 y.o. female  was evaluated in triage.  Pt complains of mouth pain.  Review of Systems  Positive: Dental pain, R ear pain Negative: Fever, cough, sore throat  Physical Exam  BP (!) 145/106 (BP Location: Right Arm)   Pulse (!) 110   Temp 98.7 F (37.1 C) (Oral)   Resp 20   SpO2 100%  Gen:   Awake, no distress   Resp:  Normal effort  MSK:   Moves extremities without difficulty  Other:  Dental decay to tooth #1 with ttp, no facial swelling  Medical Decision Making  Medically screening exam initiated at 9:30 PM.  Appropriate orders placed.  Pamela Graham was informed that the remainder of the evaluation will be completed by another provider, this initial triage assessment does not replace that evaluation, and the importance of remaining in the ED until their evaluation is complete.  Dental pain and mouth pain x 3 days, not relieved with home remedies.  R ear pain.     Pamela Helper, PA-C 02/17/21 2131    Pamela Kaplan, MD 02/18/21 1141

## 2021-02-18 DIAGNOSIS — K0889 Other specified disorders of teeth and supporting structures: Secondary | ICD-10-CM | POA: Diagnosis not present

## 2021-02-18 MED ORDER — CLINDAMYCIN HCL 150 MG PO CAPS: 150.0000 mg | ORAL_CAPSULE | Freq: Four times a day (QID) | ORAL | 0 refills | Status: AC

## 2021-02-18 MED ORDER — OXYCODONE HCL 5 MG PO TABS: 5.0000 mg | ORAL_TABLET | ORAL | 0 refills | Status: DC | PRN

## 2021-02-18 MED ORDER — CLINDAMYCIN HCL 150 MG PO CAPS
150.0000 mg | ORAL_CAPSULE | Freq: Once | ORAL | Status: AC
  Administered 2021-02-18: 150 mg via ORAL
  Filled 2021-02-18: qty 1

## 2021-02-18 MED ORDER — OXYCODONE HCL 5 MG PO TABS
5.0000 mg | ORAL_TABLET | Freq: Once | ORAL | Status: AC
Start: 2021-02-18 — End: 2021-02-18
  Administered 2021-02-18: 5 mg via ORAL
  Filled 2021-02-18: qty 1

## 2021-02-18 NOTE — Discharge Instructions (Addendum)
You were evaluated in the Emergency Department and after careful evaluation, we did not find any emergent condition requiring admission or further testing in the hospital.  Your exam/testing today was overall reassuring.  Symptoms seem to be due to a tooth infection.  Please take the clindamycin antibiotic as directed.  Recommend Tylenol 1000 mg every 4-6 hours and/or Motrin 600 mg every 4-6 hours for pain.  You can use the oxycodone medication for more significant pain.  Please return to the Emergency Department if you experience any worsening of your condition.  Thank you for allowing [name] to be a part of your care.

## 2021-02-18 NOTE — ED Provider Notes (Signed)
[facility]EMERGENCY [facility]PT [facility] Emergency Department Provider Note MRN:  [id number]  Arrival date & time: 02/18/21     Chief Complaint   Dental Pain   History of Present Illness   Pamela Graham is a 65 y.o. severe female with a history of bipolar disorder presenting to the ED with chief complaint of dental pain.  Location: Diffuse throughout the teeth but worse in the upper right molar Duration: Several months Onset: Gradual Timing: Constant Description: Ache Severity: Severe Exacerbating/Alleviating Factors: None Associated Symptoms: None Pertinent Negatives: No fever  Additional History: None  Review of Systems  A problem-focused ROS was performed. Positive for tooth pain.  Patient denies fever.  Patient's Health History    Past Medical History:  Diagnosis Date   Barrett's esophagus    DDD (degenerative disc disease), lumbar    Fibromyalgia    Gastric ulcer    Hypertension    Migraines     Past Surgical History:  Procedure Laterality Date   ABDOMINAL HYSTERECTOMY     COLONOSCOPY     UPPER GI ENDOSCOPY      No family history on file.  Social History   Socioeconomic History   Marital status: Single    Spouse name: Not on file   Number of children: Not on file   Years of education: Not on file   Highest education level: Not on file  Occupational History   Not on file  Tobacco Use   Smoking status: Former   Smokeless tobacco: Never  Vaping Use   Vaping Use: Never used  Substance and Sexual Activity   Alcohol use: No   Drug use: Yes    Types: Marijuana    Comment: Last used: Today.    Sexual activity: Not Currently  Other Topics Concern   Not on file  Social History Narrative   Not on file   Social Determinants of Health   Financial Resource Strain: Not on file  Food Insecurity: Not on file  Transportation Needs: Not on file  Physical Activity: Not on file  Stress: Not on file  Social Connections: Not on file  Intimate Partner  Violence: Not on file     Physical Exam   Vitals:   02/17/21 2109 02/18/21 0100  BP: (!) 145/106 (!) 144/108  Pulse: (!) 110 86  Resp: 20 18  Temp: 98.7 F (37.1 C)   SpO2: 100% 99%    CONSTITUTIONAL: Well-appearing, NAD NEURO:  Alert and oriented x 3, no focal deficits EYES:  eyes equal and reactive ENT/NECK:  no LAD, no JVD CARDIO: Regular rate, well-perfused, normal S1 and S2 PULM:  CTAB no wheezing or rhonchi GI/GU:  normal bowel sounds, non-distended, non-tender MSK/SPINE:  No gross deformities, no edema SKIN:  no rash, atraumatic PSYCH:  Appropriate speech and behavior  *Additional and/or pertinent findings included in MDM below  Diagnostic and Interventional Summary    EKG Interpretation  Date/Time:    Ventricular Rate:    PR Interval:    QRS Duration:   QT Interval:    QTC Calculation:   R Axis:     Text Interpretation:         Labs Reviewed - No data to display  No orders to display    Medications  oxyCODONE (Oxy IR/ROXICODONE) immediate release tablet 5 mg (has no administration in time range)  clindamycin (CLEOCIN) capsule 150 mg (has no administration in time range)  oxyCODONE-acetaminophen (PERCOCET/ROXICET) 5-325 MG per tablet 1 tablet (1 tablet Oral Given 02/17/21  2136)     Procedures  /  Critical Care Procedures  ED Course and Medical Decision Making  I have reviewed the triage vital signs, the nursing notes, and pertinent available records from the EMR.  Listed above are laboratory and imaging tests that I personally ordered, reviewed, and interpreted and then considered in my medical decision making (see below for details).  Decay and impaction of the right maxillary wisdom tooth, no signs of surrounding infection or abscess, normal vital signs, no fever, appropriate for discharge with antibiotics, pain control, advised dental follow-up.       Elmer Sow. Pilar Plate, MD [facility] Health Emergency Medicine [facility]  Health [email or web address]  Final Clinical Impressions(s) / ED Diagnoses     ICD-10-CM   1. Pain, dental  K08.89       ED Discharge Orders          Ordered    clindamycin (CLEOCIN) 150 MG capsule  Every 6 hours        02/18/21 0136    oxyCODONE (ROXICODONE) 5 MG immediate release tablet  Every 4 hours PRN        02/18/21 0136             Discharge Instructions Discussed with and Provided to Patient:    Discharge Instructions      You were evaluated in the Emergency Department and after careful evaluation, we did not find any emergent condition requiring admission or further testing in the hospital.  Your exam/testing today was overall reassuring.  Symptoms seem to be due to a tooth infection.  Please take the clindamycin antibiotic as directed.  Recommend Tylenol 1000 mg every 4-6 hours and/or Motrin 600 mg every 4-6 hours for pain.  You can use the oxycodone medication for more significant pain.  Please return to the Emergency Department if you experience any worsening of your condition.  Thank you for allowing [name] to be a part of your care.        Sabas Sous, MD 02/18/21 [phone]

## 2021-07-17 ENCOUNTER — Other Ambulatory Visit: Payer: Self-pay

## 2021-07-17 ENCOUNTER — Emergency Department (HOSPITAL_COMMUNITY): Payer: Medicare HMO

## 2021-07-17 ENCOUNTER — Emergency Department (HOSPITAL_COMMUNITY)
Admission: EM | Admit: 2021-07-17 | Discharge: 2021-07-18 | Disposition: A | Discharge status: Home / Self Care | Payer: Medicare HMO | Attending: Emergency Medicine | Admitting: Emergency Medicine

## 2021-07-17 DIAGNOSIS — K921 Melena: Secondary | ICD-10-CM | POA: Insufficient documentation

## 2021-07-17 DIAGNOSIS — K644 Residual hemorrhoidal skin tags: Secondary | ICD-10-CM | POA: Diagnosis not present

## 2021-07-17 DIAGNOSIS — R519 Headache, unspecified: Secondary | ICD-10-CM | POA: Diagnosis not present

## 2021-07-17 DIAGNOSIS — N390 Urinary tract infection, site not specified: Secondary | ICD-10-CM | POA: Insufficient documentation

## 2021-07-17 DIAGNOSIS — D72829 Elevated white blood cell count, unspecified: Secondary | ICD-10-CM | POA: Insufficient documentation

## 2021-07-17 DIAGNOSIS — R110 Nausea: Secondary | ICD-10-CM | POA: Diagnosis present

## 2021-07-17 LAB — HEPATIC FUNCTION PANEL
ALT: 20 U/L (ref 0–44)
AST: 30 U/L (ref 15–41)
Albumin: 4.1 g/dL (ref 3.5–5.0)
Alkaline Phosphatase: 112 U/L (ref 38–126)
Bilirubin, Direct: 0.4 mg/dL — ABNORMAL HIGH (ref 0.0–0.2)
Indirect Bilirubin: 0.6 mg/dL (ref 0.3–0.9)
Total Bilirubin: 1 mg/dL (ref 0.3–1.2)
Total Protein: 7.9 g/dL (ref 6.5–8.1)

## 2021-07-17 LAB — URINALYSIS, ROUTINE W REFLEX MICROSCOPIC
Bilirubin Urine: NEGATIVE
Glucose, UA: NEGATIVE mg/dL
Ketones, ur: 40 mg/dL — AB
Nitrite: POSITIVE — AB
Protein, ur: NEGATIVE mg/dL
Specific Gravity, Urine: 1.02 (ref 1.005–1.030)
pH: 6 (ref 5.0–8.0)

## 2021-07-17 LAB — BASIC METABOLIC PANEL
Anion gap: 11 (ref 5–15)
BUN: 8 mg/dL (ref 8–23)
CO2: 21 mmol/L — ABNORMAL LOW (ref 22–32)
Calcium: 9.3 mg/dL (ref 8.9–10.3)
Chloride: 107 mmol/L (ref 98–111)
Creatinine, Ser: 0.97 mg/dL (ref 0.44–1.00)
GFR, Estimated: >60 mL/min (ref >60)
Glucose, Bld: 112 mg/dL — ABNORMAL HIGH (ref 70–99)
Potassium: 3.7 mmol/L (ref 3.5–5.1)
Sodium: 139 mmol/L (ref 135–145)

## 2021-07-17 LAB — CBC
HCT: 45 % (ref 36.0–46.0)
Hemoglobin: 15.1 g/dL — ABNORMAL HIGH (ref 12.0–15.0)
MCH: 31.7 pg (ref 26.0–34.0)
MCHC: 33.6 g/dL (ref 30.0–36.0)
MCV: 94.5 fL (ref 80.0–100.0)
Platelets: 184 10*3/uL (ref 150–400)
RBC: 4.76 MIL/uL (ref 3.87–5.11)
RDW: 13.2 % (ref 11.5–15.5)
WBC: 12.9 10*3/uL — ABNORMAL HIGH (ref 4.0–10.5)
nRBC: 0 % (ref 0.0–0.2)

## 2021-07-17 LAB — URINALYSIS, MICROSCOPIC (REFLEX)

## 2021-07-17 LAB — CBG MONITORING, ED: Glucose-Capillary: 96 mg/dL (ref 70–99)

## 2021-07-17 MED ORDER — ACETAMINOPHEN 325 MG PO TABS
650.0000 mg | ORAL_TABLET | Freq: Once | ORAL | Status: AC
  Administered 2021-07-17: 650 mg via ORAL
  Filled 2021-07-17: qty 2

## 2021-07-17 MED ORDER — ONDANSETRON 4 MG PO TBDP
4.0000 mg | ORAL_TABLET | Freq: Once | ORAL | Status: AC
  Administered 2021-07-17: 4 mg via ORAL
  Filled 2021-07-17: qty 1

## 2021-07-17 MED ORDER — CEPHALEXIN 500 MG PO CAPS: 500.0000 mg | ORAL_CAPSULE | Freq: Three times a day (TID) | ORAL | 0 refills | Status: DC

## 2021-07-17 MED ORDER — SODIUM CHLORIDE 0.9 % IV SOLN
1.0000 g | Freq: Once | INTRAVENOUS | Status: AC
Start: 2021-07-17 — End: 2021-07-17
  Administered 2021-07-17: 1 g via INTRAVENOUS
  Filled 2021-07-17: qty 10

## 2021-07-17 MED ORDER — SODIUM CHLORIDE 0.9 % IV BOLUS
500.0000 mL | Freq: Once | INTRAVENOUS | Status: AC
Start: 2021-07-17 — End: 2021-07-17
  Administered 2021-07-17: 500 mL via INTRAVENOUS

## 2021-07-17 NOTE — ED Triage Notes (Signed)
EMS was called from office where patient was having blood drawn. Requesting eval of generalized weakness and BRBPR x 1 this morning. Had PCP visit yesterday and was hypertensive there so they increased the dose of her medication. Also states that her R leg "isn't doing what the left one is doing," but she states this has been ongoing since she was dx with fibromyalgia.

## 2021-07-17 NOTE — ED Notes (Addendum)
Pt walked to restroom with stand-by assist. Pt steady on her feet, and no issues identified by this nurse

## 2021-07-17 NOTE — ED Provider Triage Note (Signed)
Emergency Medicine Provider Triage Evaluation Note  Pamela Graham , a 66 y.o. female  was evaluated in triage.  Pt comes in with several complaints. She has had generalized weakness, L ear ringing, R leg "dragging", R sided headache, increased blood pressure, abdominal pain, decreased urination and bloody stool. Pt had appt with PCP yesterday and they weren't able to get labs because she was dehydrated. She had one episode of bloody stool that she describes as "tomato sauce".   Review of Systems  Positive: As above Negative: Fever, chills, nausea, vomiting, vision change, CP  Physical Exam  BP (!) 189/124 (BP Location: Right Arm)    Pulse 88    Temp 99.2 F (37.3 C) (Oral)    Resp 18    SpO2 93%  Gen:   Awake, no distress   Resp:  Normal effort  MSK:   Moves extremities without difficulty  Other:    Medical Decision Making  Medically screening exam initiated at 2:16 PM.  Appropriate orders placed.  Levi Peppel was informed that the remainder of the evaluation will be completed by another provider, this initial triage assessment does not replace that evaluation, and the importance of remaining in the ED until their evaluation is complete.     Trey Bebee T, PA-C 07/17/21 1420

## 2021-07-17 NOTE — ED Provider Notes (Signed)
[facility] Provider Note   CSN: [id number] Arrival date & time: 07/17/21  1350     History  No chief complaint on file.   P[facility] is a 66 y.o. female.  The history is provided by the patient and medical records.  [facility] is a 66 y.o. female who presents to the Emergency Department complaining of weakness.  She states that she has been having some trouble walking lately.  She went to have a blood draw today and was found to be wobbly.  She did have a flu shot yesterday at her PCPs office.  She also complains of associated nausea.  Nothing to eat for the last 4 days.  She has headache.  She did have 1 bright red bloody bowel movement today.  Dental pain for seven months.    Metoprolol 50 daily Cymbalta Ativan Ambien prn No anticoagulants.  History of ulcer, Barrett's esophagus, hypertension.     Home Medications Prior to Admission medications   Medication Sig Start Date End Date Taking? Authorizing Provider  cephALEXin (KEFLEX) 500 MG capsule Take 1 capsule (500 mg total) by mouth 3 (three) times daily. 07/17/21  Yes Tilden Fossa, MD  acetaminophen (TYLENOL) 500 MG tablet Take 1,000 mg by mouth every 4 (four) hours as needed (for fibromyalgia-related pain).    [provider]  carbamazepine (TEGRETOL XR) 200 MG 12 hr tablet Take 200 mg by mouth 2 (two) times daily.    [provider]  carbamazepine (TEGRETOL) 100 MG chewable tablet Chew 1 tablet (100 mg total) by mouth 2 (two) times a day. Patient not taking: No sig reported 11/25/18   Malvin Johns, MD  clonazePAM (KLONOPIN) 0.5 MG tablet Take 0.5 tablets (0.25 mg total) by mouth 2 (two) times daily. Patient taking differently: Take 0.5 mg by mouth 3 (three) times daily as needed for anxiety. 11/25/18   Malvin Johns, MD  cyclobenzaprine (FLEXERIL) 10 MG tablet Take 10 mg by mouth 2 (two) times daily as needed for muscle spasms.    [provider]   cyclobenzaprine (FLEXERIL) 5 MG tablet Take 1 tablet (5 mg total) by mouth 3 (three) times daily. Patient not taking: Reported on 07/03/2020 11/25/18   Malvin Johns, MD  cyclobenzaprine (FLEXERIL) 5 MG tablet Take 1 tablet (5 mg total) by mouth 2 (two) times daily as needed for muscle spasms. 07/03/20   Tegeler, Canary Brim, MD  diphenhydramine-acetaminophen (TYLENOL PM) 25-500 MG TABS tablet Take 4 tablets by mouth at bedtime.    [provider]  DULoxetine (CYMBALTA) 30 MG capsule Take 90 mg by mouth daily.    [provider]  hydrochlorothiazide (HYDRODIURIL) 25 MG tablet Take 1 tablet (25 mg total) by mouth daily. Patient not taking: Reported on 07/03/2020 08/23/17   Money, Gerlene Burdock, FNP  HYDROcodone-acetaminophen (NORCO/VICODIN) 5-325 MG tablet Take 2 tablets by mouth every 4 (four) hours as needed. Patient not taking: Reported on 07/03/2020 01/20/19   Mickie Bail, NP  lidocaine (LIDODERM) 5 % Place 1 patch onto the skin daily. Remove & Discard patch within 12 hours or as directed by MD 07/03/20   Tegeler, Canary Brim, MD  loratadine (CLARITIN) 10 MG tablet Take 10 mg by mouth daily as needed for allergies or rhinitis.    [provider]  metoprolol succinate (TOPROL-XL) 25 MG 24 hr tablet Take 25 mg by mouth daily. 05/25/20   [provider]  omeprazole (PRILOSEC) 40 MG capsule Take 40 mg by mouth  daily before breakfast. 11/20/18   [provider]  oxyCODONE (ROXICODONE) 5 MG immediate release tablet Take 1 tablet (5 mg total) by mouth every 4 (four) hours as needed for severe pain. 02/18/21   Sabas Sous, MD  perphenazine (TRILAFON) 4 MG tablet 1 in am 2 at h s Patient not taking: Reported on 07/03/2020 11/25/18   Malvin Johns, MD  prazosin (MINIPRESS) 2 MG capsule Take 2 capsules (4 mg total) by mouth at bedtime. Patient not taking: Reported on 07/03/2020 11/25/18   Malvin Johns, MD  prazosin (MINIPRESS) 5 MG capsule Take 5 mg by mouth at bedtime as  needed (for night terrors).    [provider]  temazepam (RESTORIL) 30 MG capsule Take 1 capsule (30 mg total) by mouth at bedtime. Patient not taking: Reported on 07/03/2020 11/25/18   Malvin Johns, MD  TURMERIC PO Take 1 capsule by mouth daily with breakfast.    [provider]  zolpidem (AMBIEN) 10 MG tablet Take 10 mg by mouth at bedtime as needed for sleep. 05/25/20   [provider]      Allergies    Haloperidol, Lisinopril, Other, Risperidone and related, Gabapentin, Ibuprofen, Meloxicam, Penicillins, Pregabalin, and Tramadol    Review of Systems   Review of Systems  All other systems reviewed and are negative.  Physical Exam Updated Vital Signs BP (!) 166/109    Pulse 70    Temp 99.2 F (37.3 C) (Oral)    Resp 18    SpO2 100%  Physical Exam Vitals and nursing note reviewed.  Constitutional:      Appearance: She is well-developed.  HENT:     Head: Normocephalic and atraumatic.  Cardiovascular:     Rate and Rhythm: Normal rate and regular rhythm.     Heart sounds: No murmur heard. Pulmonary:     Effort: Pulmonary effort is normal. No respiratory distress.     Breath sounds: Normal breath sounds.  Abdominal:     Palpations: Abdomen is soft.     Tenderness: There is no abdominal tenderness. There is no guarding or rebound.  Genitourinary:    Comments: External hemorrhoids present, not currently inflamed.  Small amount of bright red blood on DRE. Musculoskeletal:        General: No tenderness.  Skin:    General: Skin is warm and dry.  Neurological:     Mental Status: She is alert and oriented to person, place, and time.     Comments: 5 out of 5 strength in all 4 extremities with sensation to light touch intact in all 4 extremities.  Psychiatric:        Behavior: Behavior normal.    ED Results / Procedures / Treatments   Labs (all labs ordered are listed, but only abnormal results are displayed) Labs Reviewed  BASIC METABOLIC PANEL -  Abnormal; Notable for the following components:      Result Value   CO2 21 (*)    Glucose, Bld 112 (*)    All other components within normal limits  CBC - Abnormal; Notable for the following components:   WBC 12.9 (*)    Hemoglobin 15.1 (*)    All other components within normal limits  URINALYSIS, ROUTINE W REFLEX MICROSCOPIC - Abnormal; Notable for the following components:   Hgb urine dipstick TRACE (*)    Ketones, ur 40 (*)    Nitrite POSITIVE (*)    Leukocytes,Ua TRACE (*)    All other components within normal limits  HEPATIC FUNCTION PANEL - Abnormal; Notable for the following components:   Bilirubin, Direct 0.4 (*)    All other components within normal limits  URINALYSIS, MICROSCOPIC (REFLEX) - Abnormal; Notable for the following components:   Bacteria, UA MANY (*)    All other components within normal limits  URINE CULTURE  CBG MONITORING, ED    EKG EKG Interpretation  Date/Time:  Tuesday July 17 2021 13:56:00 EST Ventricular Rate:  90 PR Interval:  134 QRS Duration: 78 QT Interval:  382 QTC Calculation: 467 R Axis:   23 Text Interpretation: Normal sinus rhythm Normal ECG When compared with ECG of 02-Jul-2020 15:14, PREVIOUS ECG IS PRESENT Confirmed by Tilden Fossaees, Heidie Krall [phone]([id number]) on 07/17/2021 5:02:35 PM  Radiology CT Head Wo Contrast  Result Date: 07/17/2021 CLINICAL DATA:  Altered level of consciousness EXAM: CT HEAD WITHOUT CONTRAST TECHNIQUE: Contiguous axial images were obtained from the base of the skull through the vertex without intravenous contrast. RADIATION DOSE REDUCTION: This exam was performed according to the departmental dose-optimization program which includes automated exposure control, adjustment of the mA and/or kV according to patient size and/or use of iterative reconstruction technique. COMPARISON:  02/17/2012 FINDINGS: Brain: Chronic small vessel ischemic changes are seen within the left frontal periventricular and subcortical white matter. No  signs of acute infarct or hemorrhage. Lateral ventricles and midline structures are grossly unremarkable. No acute extra-axial fluid collections. No mass effect. Vascular: No hyperdense vessel or unexpected calcification. Skull: Normal. Negative for fracture or focal lesion. Sinuses/Orbits: No acute finding. Other: None. IMPRESSION: 1. No acute intracranial process. Electronically Signed   By: Sharlet SalinaMichael  Brown M.D.   On: 07/17/2021 19:34    Procedures Procedures    Medications Ordered in ED Medications  cefTRIAXone (ROCEPHIN) 1 g in sodium chloride 0.9 % 100 mL IVPB (has no administration in time range)  sodium chloride 0.9 % bolus 500 mL (500 mLs Intravenous New Bag/Given 07/17/21 1850)  ondansetron (ZOFRAN-ODT) disintegrating tablet 4 mg (4 mg Oral Given 07/17/21 2156)  acetaminophen (TYLENOL) tablet 650 mg (650 mg Oral Given 07/17/21 2156)    ED Course/ Medical Decision Making/ A&P                           Medical Decision Making Amount and/or Complexity of Data Reviewed Labs: ordered. Radiology: ordered.  Risk OTC drugs. Prescription drug management.   Patient here for evaluation of generalized weakness, feeling unwell.  History is limited as she is very vague in her symptoms.  She is nontoxic-appearing on assessment with no focal neurologic deficits.  CBC with mild leukocytosis, hemoglobin elevated, increased when compared to priors.  CMP without significant abnormality.  CT head is without Acute abnormality.  UA is concerning for UTI.  Patient denies any urinary symptoms currently, but urine was very cloudy, suspect UTI may be adding to her malaise.  We will start antibiotics and send a culture.  She has been on recent antibiotics for dental issues.  Current clinical picture is not consistent with CVA, sepsis, hypertensive urgency.  In terms of her episode of hematochezia earlier today, she does have a small amount of bright red blood on rectal examination.  Feel it is very unlikely that  she has a major upper GI hemorrhage given her clinical stability in the emergency department.  She does have a gastroenterologist.  Discussed with patient recommendation for close GI follow-up.  Discussed return precautions.        Final Clinical Impression(s) /  ED Diagnoses Final diagnoses:  Acute UTI (urinary tract infection)  Hematochezia    Rx / DC Orders ED Discharge Orders          Ordered    cephALEXin (KEFLEX) 500 MG capsule  3 times daily        07/17/21 2235              Tilden Fossaees, Gracyn Santillanes, MD 07/17/21 2243

## 2021-07-20 LAB — URINE CULTURE: Culture: >=100000 — AB

## 2021-07-21 ENCOUNTER — Telehealth: Payer: Self-pay | Admitting: Emergency Medicine

## 2021-07-21 NOTE — Telephone Encounter (Signed)
Post ED Visit - Positive Culture Follow-up  Culture report reviewed by antimicrobial stewardship pharmacist: [facility] []  , Pharm.D. []  Enzo Bi, Pharm.D., BCPS AQ-ID []  , Pharm.D., BCPS []  Celedonio Miyamoto, Pharm.D., BCPS []  [place], Garvin Fila.D., BCPS, AAHIVP []  , Pharm.D., BCPS, AAHIVP []  Georgina Pillion, PharmD, BCPS []  , PharmD, BCPS []  [place], PharmD, BCPS []  [address], PharmD []  , PharmD, BCPS [x]  Estella Husk, PharmD  Pharmacy Team []  Lysle Pearl, PharmD []  , PharmD []  Phillips Climes, PharmD []  , Rph []  Agapito Games) , PharmD []  Verlan Friends, PharmD []  , PharmD []  Mervyn Gay, PharmD []  , PharmD []  Vinnie Level, PharmD []  [facility], PharmD []  , PharmD []  Len Childs, PharmD   Positive urine culture Treated with Cephalexin, organism sensitive to the same and no further patient follow-up is required at this time.  Cataleah Stites 07/21/2021, 12:40 PM

## 2021-10-29 ENCOUNTER — Emergency Department (HOSPITAL_COMMUNITY): Payer: Medicare Other

## 2021-10-29 ENCOUNTER — Encounter (HOSPITAL_COMMUNITY): Payer: Self-pay

## 2021-10-29 ENCOUNTER — Emergency Department (HOSPITAL_COMMUNITY)
Admission: EM | Admit: 2021-10-29 | Discharge: 2021-10-29 | Disposition: A | Discharge status: Home / Self Care | Payer: Medicare Other | Attending: Emergency Medicine | Admitting: Emergency Medicine

## 2021-10-29 ENCOUNTER — Other Ambulatory Visit: Payer: Self-pay

## 2021-10-29 DIAGNOSIS — W109XXA Fall (on) (from) unspecified stairs and steps, initial encounter: Secondary | ICD-10-CM | POA: Diagnosis not present

## 2021-10-29 DIAGNOSIS — S8002XA Contusion of left knee, initial encounter: Secondary | ICD-10-CM | POA: Insufficient documentation

## 2021-10-29 DIAGNOSIS — S60042A Contusion of left ring finger without damage to nail, initial encounter: Secondary | ICD-10-CM | POA: Diagnosis not present

## 2021-10-29 DIAGNOSIS — Z23 Encounter for immunization: Secondary | ICD-10-CM | POA: Insufficient documentation

## 2021-10-29 DIAGNOSIS — S8992XA Unspecified injury of left lower leg, initial encounter: Secondary | ICD-10-CM | POA: Diagnosis present

## 2021-10-29 MED ORDER — TETANUS-DIPHTH-ACELL PERTUSSIS 5-2.5-18.5 LF-MCG/0.5 IM SUSY
0.5000 mL | PREFILLED_SYRINGE | Freq: Once | INTRAMUSCULAR | Status: AC
  Administered 2021-10-29: 0.5 mL via INTRAMUSCULAR
  Filled 2021-10-29: qty 0.5

## 2021-10-29 MED ORDER — HYDROCODONE-ACETAMINOPHEN 5-325 MG PO TABS
1.0000 | ORAL_TABLET | Freq: Once | ORAL | Status: AC
  Administered 2021-10-29: 1 via ORAL
  Filled 2021-10-29: qty 1

## 2021-10-29 NOTE — ED Triage Notes (Signed)
Pt arrived POV from upstairs c/o catching her fingers in a chair upstairs while she was visiting her daughter. Pt has a small laceration on her ring finger on the left hand. ?

## 2021-10-29 NOTE — ED Provider Notes (Signed)
?[facility] ?Provider Note ? ? ?CSN: [id number] ?Arrival date & time: 10/29/21  1641 ? ?  ? ?History ? ?Chief Complaint  ?Patient presents with  ? Finger Injury  ? ? ?Darolyn RuaKim Snelgrove is a 66 y.o. female. ? ?66 year old female with prior medical history as detailed below presents for evaluation.  Patient was upstairs with her daughter who is admitted.  Patient apparently got her left hand stuck in the blue reclining chair in the patient's room.  Her left ring finger was injured.  She also apparently fell when this happened and she bruised her left anterior knee. ? ?Patient sent to the ED for evaluation. ? ?Patient complains of pain to the left ring finger and left knee. ? ?She does have a small abrasion on the left ring finger.  She is unsure of her last tetanus. ? ?The history is provided by the patient and medical records.  ?Illness ?Location:  Injury to left ring finger, injury to left knee ?Severity:  Mild ?Onset quality:  Sudden ?Duration:  3 hours ?Timing:  Rare ?Progression:  Unchanged ?Chronicity:  New ? ?  ? ?Home Medications ?Prior to Admission medications   ?Medication Sig Start Date End Date Taking? Authorizing Provider  ?acetaminophen (TYLENOL) 500 MG tablet Take 1,000 mg by mouth every 4 (four) hours as needed (for fibromyalgia-related pain).    [provider]  ?carbamazepine (TEGRETOL XR) 200 MG 12 hr tablet Take 200 mg by mouth 2 (two) times daily.    [provider]  ?carbamazepine (TEGRETOL) 100 MG chewable tablet Chew 1 tablet (100 mg total) by mouth 2 (two) times a day. ?Patient not taking: No sig reported 11/25/18   Malvin JohnsFarah, Brian, MD  ?cephALEXin (KEFLEX) 500 MG capsule Take 1 capsule (500 mg total) by mouth 3 (three) times daily. 07/17/21   Tilden Fossaees, Elizabeth, MD  ?clonazePAM (KLONOPIN) 0.5 MG tablet Take 0.5 tablets (0.25 mg total) by mouth 2 (two) times daily. ?Patient taking differently: Take 0.5 mg by mouth 3 (three) times daily as needed for  anxiety. 11/25/18   Malvin JohnsFarah, Brian, MD  ?cyclobenzaprine (FLEXERIL) 10 MG tablet Take 10 mg by mouth 2 (two) times daily as needed for muscle spasms.    [provider]  ?cyclobenzaprine (FLEXERIL) 5 MG tablet Take 1 tablet (5 mg total) by mouth 3 (three) times daily. ?Patient not taking: Reported on 07/03/2020 11/25/18   Malvin JohnsFarah, Brian, MD  ?cyclobenzaprine (FLEXERIL) 5 MG tablet Take 1 tablet (5 mg total) by mouth 2 (two) times daily as needed for muscle spasms. 07/03/20   Tegeler, Canary Brimhristopher J, MD  ?diphenhydramine-acetaminophen (TYLENOL PM) 25-500 MG TABS tablet Take 4 tablets by mouth at bedtime.    [provider]  ?DULoxetine (CYMBALTA) 30 MG capsule Take 90 mg by mouth daily.    [provider]  ?hydrochlorothiazide (HYDRODIURIL) 25 MG tablet Take 1 tablet (25 mg total) by mouth daily. ?Patient not taking: Reported on 07/03/2020 08/23/17   Money, Gerlene Burdockravis B, FNP  ?HYDROcodone-acetaminophen (NORCO/VICODIN) 5-325 MG tablet Take 2 tablets by mouth every 4 (four) hours as needed. ?Patient not taking: Reported on 07/03/2020 01/20/19   Mickie Bailate, Kelly H, NP  ?lidocaine (LIDODERM) 5 % Place 1 patch onto the skin daily. Remove & Discard patch within 12 hours or as directed by MD 07/03/20   Tegeler, Canary Brimhristopher J, MD  ?loratadine (CLARITIN) 10 MG tablet Take 10 mg by mouth daily as needed for allergies or rhinitis.    [provider]  ?metoprolol  succinate (TOPROL-XL) 25 MG 24 hr tablet Take 25 mg by mouth daily. 05/25/20   [provider]  ?omeprazole (PRILOSEC) 40 MG capsule Take 40 mg by mouth daily before breakfast. 11/20/18   [provider]  ?oxyCODONE (ROXICODONE) 5 MG immediate release tablet Take 1 tablet (5 mg total) by mouth every 4 (four) hours as needed for severe pain. 02/18/21   Sabas Sous, MD  ?perphenazine (TRILAFON) 4 MG tablet 1 in am 2 at h s ?Patient not taking: Reported on 07/03/2020 11/25/18   Malvin Johns, MD  ?prazosin (MINIPRESS) 2 MG capsule Take 2 capsules  (4 mg total) by mouth at bedtime. ?Patient not taking: Reported on 07/03/2020 11/25/18   Malvin Johns, MD  ?prazosin (MINIPRESS) 5 MG capsule Take 5 mg by mouth at bedtime as needed (for night terrors).    [provider]  ?temazepam (RESTORIL) 30 MG capsule Take 1 capsule (30 mg total) by mouth at bedtime. ?Patient not taking: Reported on 07/03/2020 11/25/18   Malvin Johns, MD  ?TURMERIC PO Take 1 capsule by mouth daily with breakfast.    [provider]  ?zolpidem (AMBIEN) 10 MG tablet Take 10 mg by mouth at bedtime as needed for sleep. 05/25/20   [provider]  ?   ? ?Allergies    ?Haloperidol, Lisinopril, Other, Risperidone and related, Gabapentin, Ibuprofen, Meloxicam, Penicillins, Pregabalin, and Tramadol   ? ?Review of Systems   ?Review of Systems  ?All other systems reviewed and are negative. ? ?Physical Exam ?Updated Vital Signs ?BP (!) 156/105   Pulse 66   Temp 98.4 ?F (36.9 ?C) (Oral)   Resp 20   Ht 5\' 7"  (1.702 m)   Wt 100.7 kg   SpO2 98%   BMI 34.77 kg/m?  ?Physical Exam ?Vitals and nursing note reviewed.  ?Constitutional:   ?   General: She is not in acute distress. ?   Appearance: Normal appearance. She is well-developed.  ?HENT:  ?   Head: Normocephalic and atraumatic.  ?Eyes:  ?   Conjunctiva/sclera: Conjunctivae normal.  ?   Pupils: Pupils are equal, round, and reactive to light.  ?Cardiovascular:  ?   Rate and Rhythm: Normal rate and regular rhythm.  ?   Heart sounds: Normal heart sounds.  ?Pulmonary:  ?   Effort: Pulmonary effort is normal. No respiratory distress.  ?   Breath sounds: Normal breath sounds.  ?Abdominal:  ?   General: There is no distension.  ?   Palpations: Abdomen is soft.  ?   Tenderness: There is no abdominal tenderness.  ?Musculoskeletal:     ?   General: Tenderness present. No deformity. Normal range of motion.  ?   Cervical back: Normal range of motion and neck supple.  ?   Comments: Mild contusion noted to the left ring finger.  Ring removed  from finger.  Patient with full active range of motion of the left finger.  Patient with intact sensation and vascular status at the distal aspect of the left ring finger. ? ?Left knee has mild contusion over the anterior aspect.  Patient's knee is stable.  Patient with full active range of motion.  Distal left lower extremity is neurovascular intact.  ?Skin: ?   General: Skin is warm and dry.  ?Neurological:  ?   General: No focal deficit present.  ?   Mental Status: She is alert and oriented to person, place, and time.  ? ? ?ED Results / Procedures / Treatments   ?  Labs ?(all labs ordered are listed, but only abnormal results are displayed) ?Labs Reviewed - No data to display ? ?EKG ?None ? ?Radiology ?DG Knee Complete 4 Views Left ? ?Result Date: 10/29/2021 ?CLINICAL DATA:  Knee pain. EXAM: LEFT KNEE - COMPLETE 4+ VIEW COMPARISON:  None Available. FINDINGS: Lateral view is technically limited secondary to patient positioning. No evidence of fracture, dislocation, or joint effusion. Joint spaces are maintained. There are early degenerative osteophytes of the medial compartment. Soft tissues are unremarkable. IMPRESSION: 1. No acute bony abnormality. 2. Early degenerative changes of the medial compartment. Electronically Signed   By: Darliss Cheney M.D.   On: 10/29/2021 19:37  ? ?DG Hand Complete Left ? ?Result Date: 10/29/2021 ?CLINICAL DATA:  Injury, fourth and fifth digit pain EXAM: LEFT HAND - COMPLETE 3+ VIEW COMPARISON:  None Available. FINDINGS: Frontal, oblique, lateral views of the left hand are obtained. Jewelry obscures portions of the fourth proximal phalanx. No acute fracture, subluxation, or dislocation. Joint spaces are relatively well preserved. Soft tissues are normal. IMPRESSION: 1. Unremarkable left hand. Electronically Signed   By: Sharlet Salina M.D.   On: 10/29/2021 19:35   ? ?Procedures ?Procedures  ? ? ?Medications Ordered in ED ?Medications  ?HYDROcodone-acetaminophen (NORCO/VICODIN) 5-325 MG  per tablet 1 tablet (1 tablet Oral Given 10/29/21 1817)  ?Tdap (BOOSTRIX) injection 0.5 mL (0.5 mLs Intramuscular Given 10/29/21 2121)  ? ? ?ED Course/ Medical Decision Making/ A&P ?  ?                        ?Med

## 2021-10-29 NOTE — ED Notes (Signed)
Ortho called regarding knee immobilizer and finger splint application. Per ortho, they will be down shortly.  ?

## 2021-10-29 NOTE — ED Notes (Signed)
Pt ambulated to the top of the hill to smoke  ?

## 2021-10-29 NOTE — ED Provider Triage Note (Signed)
Emergency Medicine Provider Triage Evaluation Note ? ?Pamela [facility] , a 66 y.o. female  was evaluated in triage.  Pt complains of left fourth and fifth digit injury while she was upstairs with her daughter and moving part of the bed.  Also complains of left knee pain.  Unsure of last tetanus. ? ?Review of Systems  ?Positive: Arthralgias ?Negative: Fever ? ?Physical Exam  ?Ht 5\' 7"  (1.702 m)   Wt 100.7 kg   BMI 34.77 kg/m?  ?Gen:   Awake, no distress   ?Resp:  Normal effort  ?MSK:   Moves extremities without difficulty  ?Other:  Abrasion noted to left fourth digit with pain with range of motion of fourth and fifth digit.  Diffuse tenderness of left knee ? ?Medical Decision Making  ?Medically screening exam initiated at 6:10 PM.  Appropriate orders placed.  Auburn Rote was informed that the remainder of the evaluation will be completed by another provider, this initial triage assessment does not replace that evaluation, and the importance of remaining in the ED until their evaluation is complete. ? ?Imaging ordered ?  ?Delia Heady, PA-C ?10/29/21 1811 ? ?

## 2021-10-29 NOTE — ED Notes (Signed)
This RN was notified that patient had fallen by [place] NT when ambulating from wheelchair to bed. This RN found pt on floor and helped NT pick her up off the floor and ambulate to bed. Patient denies hitting her head or LOC. Patient currently sitting in bed with BP and pulse ox hooked up onto monitor.  ?

## 2021-10-29 NOTE — ED Notes (Addendum)
While transferring to the bed, pt lost their balance and slid down to the floor on her bottom. Additional staff were called to assist pt back into bed and pt was evaluated for any bruising or pain by this NT. No LOC or head trauma noted during the event.  ?

## 2021-10-29 NOTE — Discharge Instructions (Addendum)
Return for any problem.  ? ?Apply ice to affected area. ? ?Take Tylenol as instructed for pain. ?

## 2021-10-29 NOTE — ED Notes (Signed)
Discharge instructions reviewed with patient. Patient verbalized understanding of instructions. Follow-up care and medications were reviewed. Patient ambulatory with steady gait. VSS upon discharge.  ?

## 2021-11-01 ENCOUNTER — Emergency Department (HOSPITAL_COMMUNITY)
Admission: EM | Admit: 2021-11-01 | Discharge: 2021-11-01 | Disposition: A | Discharge status: Home / Self Care | Payer: Medicare Other | Attending: Emergency Medicine | Admitting: Emergency Medicine

## 2021-11-01 ENCOUNTER — Other Ambulatory Visit: Payer: Self-pay

## 2021-11-01 ENCOUNTER — Emergency Department (HOSPITAL_COMMUNITY): Payer: Medicare Other

## 2021-11-01 ENCOUNTER — Encounter (HOSPITAL_COMMUNITY): Payer: Self-pay | Admitting: Oncology

## 2021-11-01 DIAGNOSIS — E119 Type 2 diabetes mellitus without complications: Secondary | ICD-10-CM | POA: Insufficient documentation

## 2021-11-01 DIAGNOSIS — W231XXA Caught, crushed, jammed, or pinched between stationary objects, initial encounter: Secondary | ICD-10-CM | POA: Diagnosis not present

## 2021-11-01 DIAGNOSIS — S6992XA Unspecified injury of left wrist, hand and finger(s), initial encounter: Secondary | ICD-10-CM | POA: Diagnosis present

## 2021-11-01 DIAGNOSIS — M25562 Pain in left knee: Secondary | ICD-10-CM | POA: Insufficient documentation

## 2021-11-01 DIAGNOSIS — S60415A Abrasion of left ring finger, initial encounter: Secondary | ICD-10-CM | POA: Insufficient documentation

## 2021-11-01 DIAGNOSIS — S60419A Abrasion of unspecified finger, initial encounter: Secondary | ICD-10-CM

## 2021-11-01 LAB — CBG MONITORING, ED: Glucose-Capillary: 124 mg/dL — ABNORMAL HIGH (ref 70–99)

## 2021-11-01 MED ORDER — HYDROCODONE-ACETAMINOPHEN 5-325 MG PO TABS
1.0000 | ORAL_TABLET | Freq: Once | ORAL | Status: AC
  Administered 2021-11-01: 1 via ORAL
  Filled 2021-11-01: qty 1

## 2021-11-01 NOTE — ED Notes (Signed)
[facility] Crisis Line phone number [phone].

## 2021-11-01 NOTE — Discharge Instructions (Signed)
Use Tylenol or ibuprofen as needed for aches and pains.  Follow-up with orthopedic doctor tomorrow and wear your brace as prescribed.  Return to the ED with new or worsening symptoms

## 2021-11-01 NOTE — ED Notes (Signed)
Pt states she cant leave without receiving pain meds, md notified.

## 2021-11-01 NOTE — ED Notes (Signed)
Pt states she needs a new knee immobilizer, md notified.

## 2021-11-01 NOTE — ED Triage Notes (Signed)
Pt c/o left finger pain and knee pain that she was seen yesterday for. Pt given TDAP yesterday. Pt also needs blood sugar checked as her nurse has not been out in a week.

## 2021-11-01 NOTE — ED Notes (Signed)
Pt found halfway falling off the stretcher, pt states she was trying to use the restroom, pt educated on asking staff for assistance, pt assisted to restroom, will continue to monitor.

## 2021-11-01 NOTE — ED Provider Triage Note (Signed)
Emergency Medicine Provider Triage Evaluation Note  Rosene Arranaga , a 66 y.o. female  was evaluated in triage.  Pt complains of left knee pain.  Patient was seen evaluated in the emergency department for similar last night.  She denies any other additional injury but came here for increased level of pain.  Review of Systems  Positive: Negative: See above  Physical Exam  BP (!) 140/97 (BP Location: Left Arm)   Pulse 81   Temp 98 F (36.7 C) (Oral)   Resp 16   SpO2 95%  Gen:   Awake, no distress   Resp:  Normal effort  MSK:   Moves extremities without difficulty  Other:  Sensation intact distally in the lower extremities.  There is a orthopedic wrap over the left knee.  Medical Decision Making  Medically screening exam initiated at 12:49 PM.  Appropriate orders placed.  Kamylle Didier was informed that the remainder of the evaluation will be completed by another provider, this initial triage assessment does not replace that evaluation, and the importance of remaining in the ED until their evaluation is complete.     Myna Bright [place], [place] 11/01/21 1250
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
## 2022-06-02 IMAGING — CR DG KNEE COMPLETE 4+V*L*
4 series · 4 of 4 positions shown · non-contrast
Comparison: 10/29/2021

CLINICAL DATA: Left knee pain.  No trauma history submitted.

EXAM:
LEFT KNEE - COMPLETE 4+ VIEW

[x knee ap left]
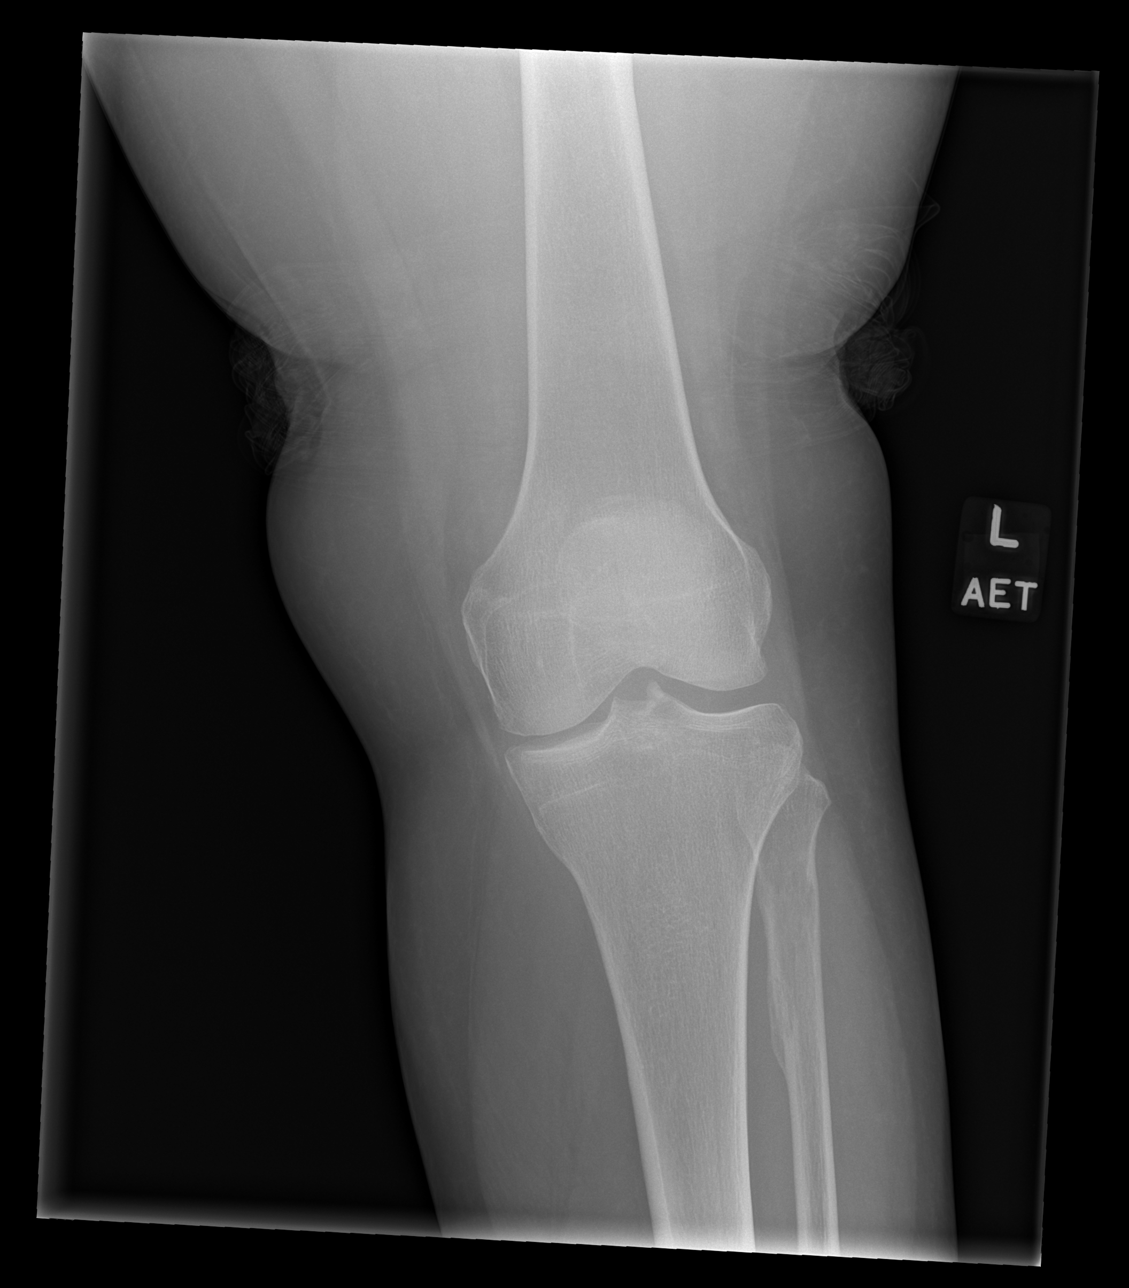

[x knee obl left (1 of 2)]
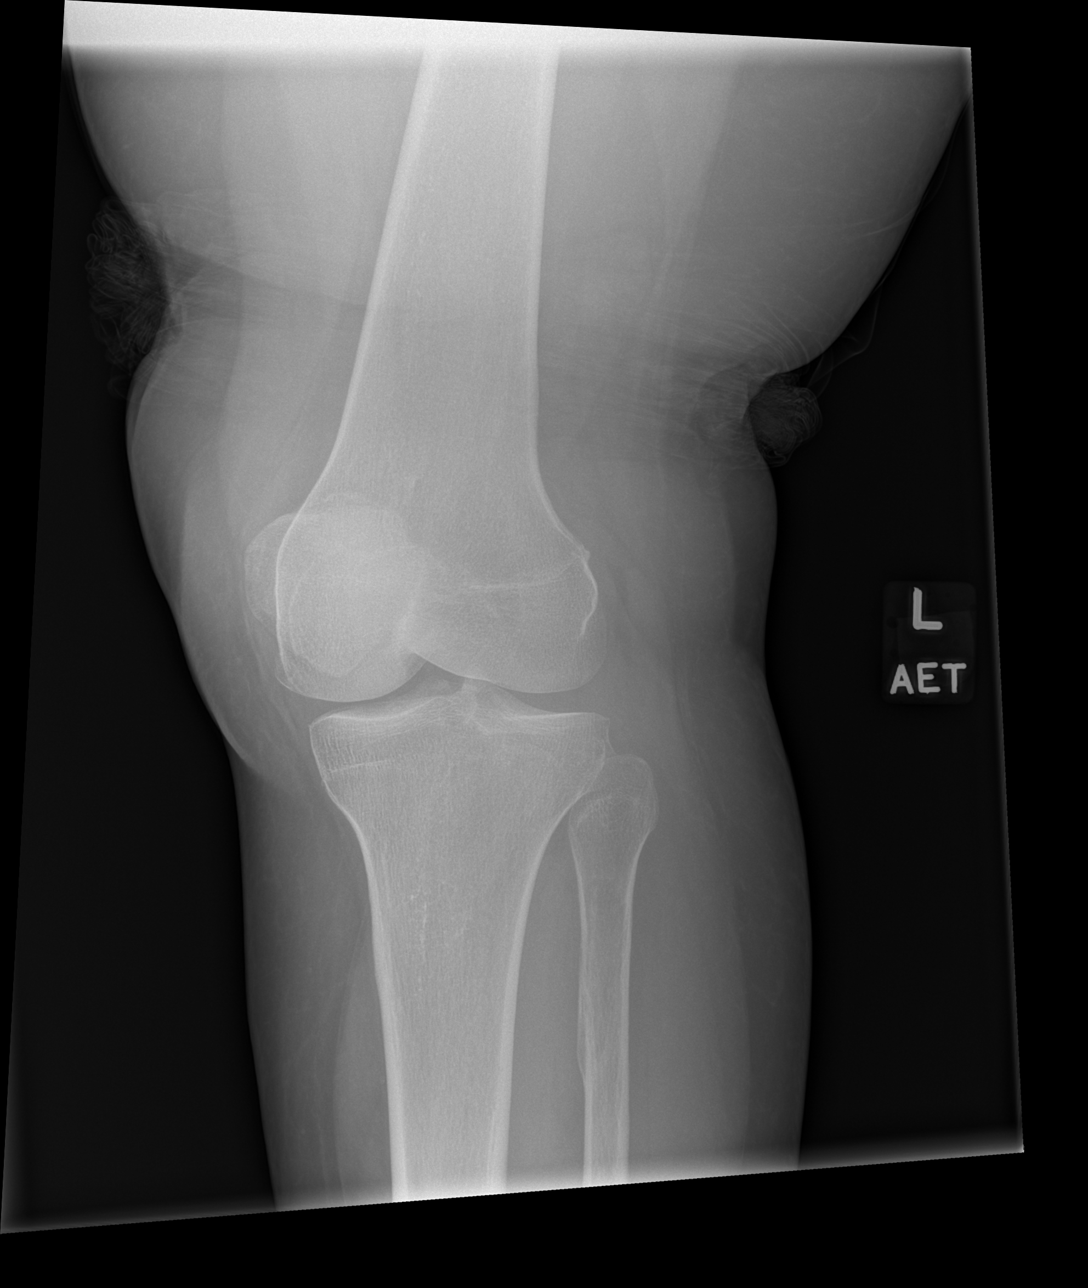

[x knee obl left (2 of 2)]
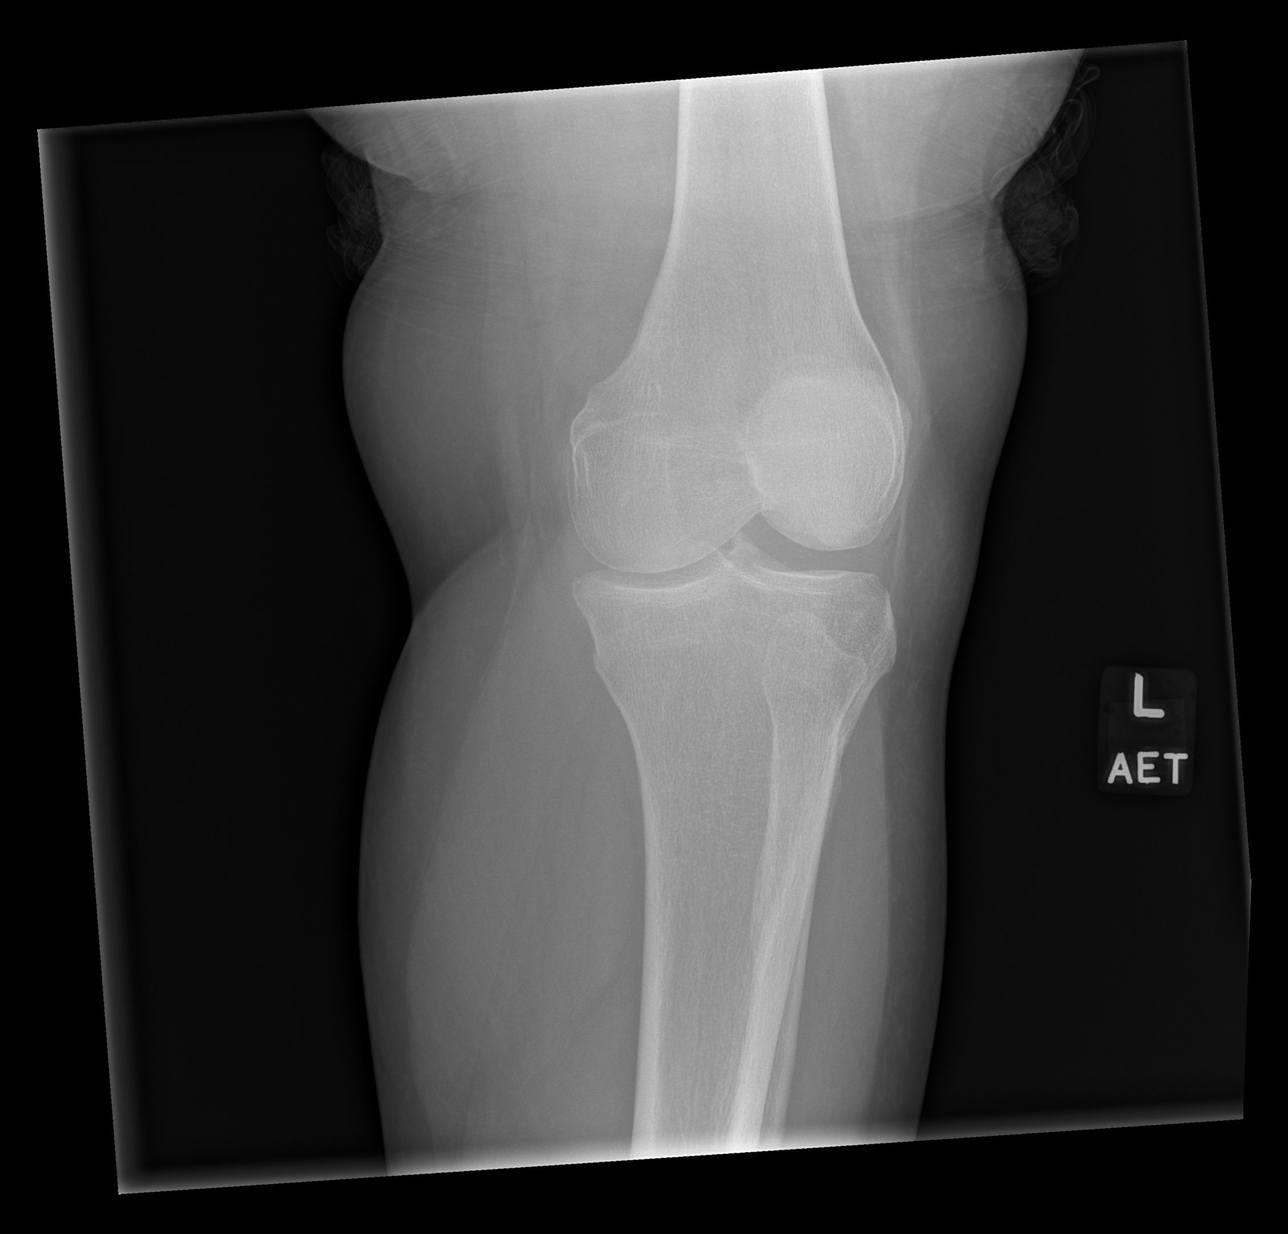

[x knee lat left]
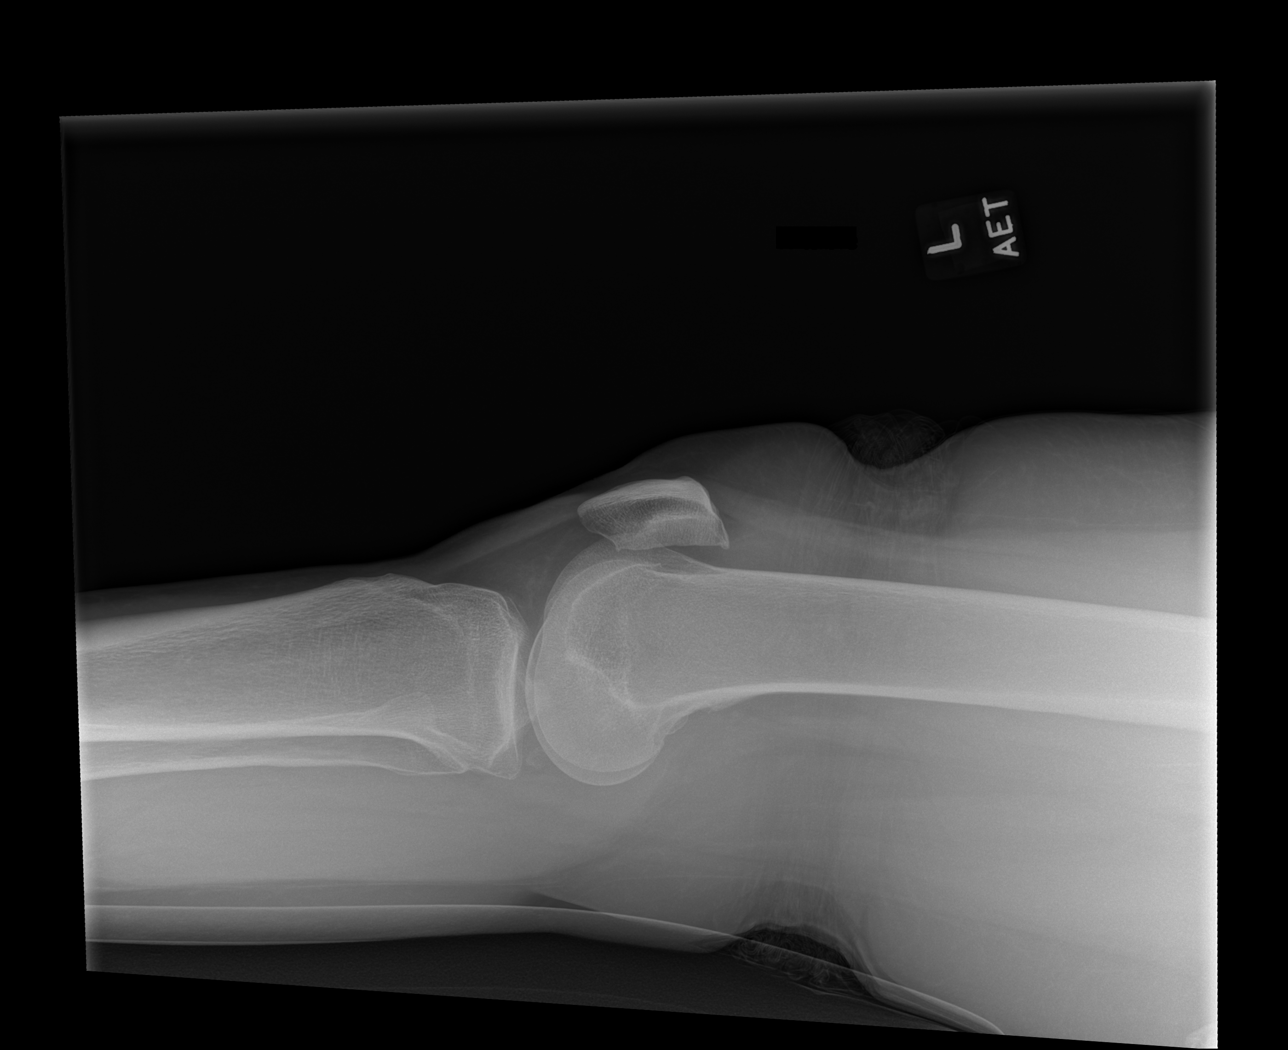

[4 of 4 positions shown; findings below may reference images not displayed]

FINDINGS: No acute fracture or dislocation. No joint effusion. Mild medial and
patellofemoral compartment joint space narrowing and subchondral
sclerosis.
IMPRESSION: Degenerative change, without acute osseous finding.

## 2022-06-28 ENCOUNTER — Ambulatory Visit (HOSPITAL_COMMUNITY)
Admission: EM | Admit: 2022-06-28 | Discharge: 2022-06-28 | Disposition: A | Discharge status: Home / Self Care | Payer: Medicare HMO | Attending: Psychiatry | Admitting: Psychiatry

## 2022-06-28 DIAGNOSIS — F3132 Bipolar disorder, current episode depressed, moderate: Secondary | ICD-10-CM

## 2022-06-28 DIAGNOSIS — F319 Bipolar disorder, unspecified: Secondary | ICD-10-CM | POA: Insufficient documentation
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
## 2022-06-28 NOTE — Progress Notes (Signed)
   06/28/22 1728  [place] (Walk-ins at [facility] only)  How Did You Hear About [name]? Family/Friend  What Is the Reason for Your Visit/Call Today? Patient arrives by [facility] after patient's sister contacted [employer] to transport pt this date to [facility]. Sister has concerns that patient has become more depressed and withdrawn lately. Patient denies any S/I, H/I or AVH.Patient is a 67 year old female that presents this date with her Act Team Nurse ([facility]) and sister Bridgett Larsson [phone] as a voluntary walk in brought in by [employer] after sister reports while visiting her mother earlier today she noticed patient to be "sitting in the dark" and "not acting like herself." Sister states that patient is usually very talkative when she visits although today patient appeared to be withdrawn. Sister denies patient made any comments in reference to S/I and H/I as patient continues to deny any S/I, H/I or AVH at the time of triage. Sister reports she doesn't think patient may have taken her medications today (ACT Team nurse has patient's medications). Patient is observed to be somewhat withdrawn but is oriented x 4.  Patient is unsure why she is presenting today reporting her sister wanted her to "come get checked out." Sister is also concerned that patient hasn't been eating and reports there was "no food in the home" this date . Patient resides with her long term partner (boyfriend) since 2008  How Long Has This Been Causing You Problems? <Week  Have You Recently Had Any Thoughts About Hurting Yourself? No  Are You Planning to Commit Suicide/Harm Yourself At This time? No  Have you Recently Had Thoughts About [place]? No  Are You Planning To Harm Someone At This Time? No  Are you currently experiencing any auditory, visual or other hallucinations? No  Have You Used Any Alcohol or Drugs in the Past 24 Hours? No  Do you have any current medical co-morbidities that require immediate  attention? No  Clinician description of patient physical appearance/behavior: Patient presents somewhat guarded although is orientied x 4.  What Do You Feel Would Help You the Most Today?  (To be determined)  If access to [facility] Ur[facility] was not available, would you have sought care in the Emergency Department? No  Determination of Need Routine (7 days)  Options For Referral Outpatient Therapy

## 2022-06-28 NOTE — Discharge Instructions (Signed)

## 2022-06-29 ENCOUNTER — Emergency Department (HOSPITAL_COMMUNITY): Payer: Medicare HMO

## 2022-06-29 ENCOUNTER — Other Ambulatory Visit: Payer: Self-pay

## 2022-06-29 ENCOUNTER — Encounter (HOSPITAL_COMMUNITY): Payer: Self-pay

## 2022-06-29 ENCOUNTER — Emergency Department (HOSPITAL_COMMUNITY)
Admission: EM | Admit: 2022-06-29 | Discharge: 2022-07-03 | Disposition: A | Discharge status: Home / Self Care | Payer: Medicare HMO | Attending: Emergency Medicine | Admitting: Emergency Medicine

## 2022-06-29 DIAGNOSIS — R4689 Other symptoms and signs involving appearance and behavior: Secondary | ICD-10-CM | POA: Insufficient documentation

## 2022-06-29 DIAGNOSIS — F332 Major depressive disorder, recurrent severe without psychotic features: Secondary | ICD-10-CM | POA: Diagnosis not present

## 2022-06-29 DIAGNOSIS — I10 Essential (primary) hypertension: Secondary | ICD-10-CM | POA: Diagnosis not present

## 2022-06-29 DIAGNOSIS — R4182 Altered mental status, unspecified: Secondary | ICD-10-CM | POA: Diagnosis not present

## 2022-06-29 DIAGNOSIS — N179 Acute kidney failure, unspecified: Secondary | ICD-10-CM

## 2022-06-29 DIAGNOSIS — F32A Depression, unspecified: Secondary | ICD-10-CM | POA: Diagnosis present

## 2022-06-29 DIAGNOSIS — E876 Hypokalemia: Secondary | ICD-10-CM

## 2022-06-29 DIAGNOSIS — Z1152 Encounter for screening for COVID-19: Secondary | ICD-10-CM | POA: Insufficient documentation

## 2022-06-29 DIAGNOSIS — Z79899 Other long term (current) drug therapy: Secondary | ICD-10-CM | POA: Insufficient documentation

## 2022-06-29 DIAGNOSIS — R627 Adult failure to thrive: Secondary | ICD-10-CM | POA: Diagnosis present

## 2022-06-29 DIAGNOSIS — F3132 Bipolar disorder, current episode depressed, moderate: Secondary | ICD-10-CM

## 2022-06-29 LAB — BASIC METABOLIC PANEL
Anion gap: 11 (ref 5–15)
BUN: 8 mg/dL (ref 8–23)
CO2: 26 mmol/L (ref 22–32)
Calcium: 8.9 mg/dL (ref 8.9–10.3)
Chloride: 101 mmol/L (ref 98–111)
Creatinine, Ser: 1.11 mg/dL — ABNORMAL HIGH (ref 0.44–1.00)
GFR, Estimated: 56 mL/min — ABNORMAL LOW (ref >60)
Glucose, Bld: 96 mg/dL (ref 70–99)
Potassium: 3 mmol/L — ABNORMAL LOW (ref 3.5–5.1)
Sodium: 138 mmol/L (ref 135–145)

## 2022-06-29 LAB — COMPREHENSIVE METABOLIC PANEL
ALT: 20 U/L (ref 0–44)
AST: 29 U/L (ref 15–41)
Albumin: 4 g/dL (ref 3.5–5.0)
Alkaline Phosphatase: 95 U/L (ref 38–126)
Anion gap: 12 (ref 5–15)
BUN: 10 mg/dL (ref 8–23)
CO2: 28 mmol/L (ref 22–32)
Calcium: 9.3 mg/dL (ref 8.9–10.3)
Chloride: 99 mmol/L (ref 98–111)
Creatinine, Ser: 1.16 mg/dL — ABNORMAL HIGH (ref 0.44–1.00)
GFR, Estimated: 53 mL/min — ABNORMAL LOW (ref >60)
Glucose, Bld: 119 mg/dL — ABNORMAL HIGH (ref 70–99)
Potassium: 2.7 mmol/L — CL (ref 3.5–5.1)
Sodium: 139 mmol/L (ref 135–145)
Total Bilirubin: 0.7 mg/dL (ref 0.3–1.2)
Total Protein: 6.9 g/dL (ref 6.5–8.1)

## 2022-06-29 LAB — CBC WITH DIFFERENTIAL/PLATELET
Abs Immature Granulocytes: 0.02 10*3/uL (ref 0.00–0.07)
Basophils Absolute: 0 10*3/uL (ref 0.0–0.1)
Basophils Relative: 0 %
Eosinophils Absolute: 0 10*3/uL (ref 0.0–0.5)
Eosinophils Relative: 0 %
HCT: 43.3 % (ref 36.0–46.0)
Hemoglobin: 15 g/dL (ref 12.0–15.0)
Immature Granulocytes: 0 %
Lymphocytes Relative: 25 %
Lymphs Abs: 2 10*3/uL (ref 0.7–4.0)
MCH: 31.6 pg (ref 26.0–34.0)
MCHC: 34.6 g/dL (ref 30.0–36.0)
MCV: 91.4 fL (ref 80.0–100.0)
Monocytes Absolute: 1 10*3/uL (ref 0.1–1.0)
Monocytes Relative: 13 %
Neutro Abs: 4.9 10*3/uL (ref 1.7–7.7)
Neutrophils Relative %: 62 %
Platelets: 150 10*3/uL (ref 150–400)
RBC: 4.74 MIL/uL (ref 3.87–5.11)
RDW: 13 % (ref 11.5–15.5)
WBC: 8 10*3/uL (ref 4.0–10.5)
nRBC: 0 % (ref 0.0–0.2)

## 2022-06-29 LAB — ETHANOL: Alcohol, Ethyl (B): <10 mg/dL (ref <10)

## 2022-06-29 LAB — CARBAMAZEPINE LEVEL, TOTAL: Carbamazepine Lvl: <2 ug/mL — ABNORMAL LOW (ref 4.0–12.0)

## 2022-06-29 LAB — MAGNESIUM: Magnesium: 2.1 mg/dL (ref 1.7–2.4)

## 2022-06-29 MED ORDER — POTASSIUM CHLORIDE CRYS ER 20 MEQ PO TBCR
40.0000 meq | EXTENDED_RELEASE_TABLET | Freq: Every day | ORAL | Status: AC
  Administered 2022-06-30 – 2022-07-02 (×3): 40 meq via ORAL
  Filled 2022-06-29 (×3): qty 2

## 2022-06-29 MED ORDER — LACTATED RINGERS IV BOLUS
1000.0000 mL | Freq: Once | INTRAVENOUS | Status: AC
  Administered 2022-06-29: 1000 mL via INTRAVENOUS

## 2022-06-29 MED ORDER — ZOLPIDEM TARTRATE 5 MG PO TABS
5.0000 mg | ORAL_TABLET | Freq: Every evening | ORAL | Status: DC | PRN
  Filled 2022-06-29: qty 1

## 2022-06-29 MED ORDER — POTASSIUM CHLORIDE CRYS ER 20 MEQ PO TBCR
60.0000 meq | EXTENDED_RELEASE_TABLET | Freq: Once | ORAL | Status: AC
  Administered 2022-06-29: 60 meq via ORAL
  Filled 2022-06-29: qty 3

## 2022-06-29 MED ORDER — ONDANSETRON HCL 4 MG PO TABS: 4.0000 mg | ORAL_TABLET | Freq: Three times a day (TID) | ORAL | Status: DC | PRN

## 2022-06-29 MED ORDER — CARBAMAZEPINE 200 MG PO TABS
100.0000 mg | ORAL_TABLET | Freq: Two times a day (BID) | ORAL | Status: DC
  Administered 2022-06-30: 100 mg via ORAL
  Filled 2022-06-29 (×3): qty 0.5

## 2022-06-29 MED ORDER — METOPROLOL SUCCINATE ER 25 MG PO TB24
25.0000 mg | ORAL_TABLET | Freq: Every day | ORAL | Status: DC
  Administered 2022-06-30 – 2022-07-02 (×3): 25 mg via ORAL
  Filled 2022-06-29 (×3): qty 1

## 2022-06-29 MED ORDER — POTASSIUM CHLORIDE 10 MEQ/100ML IV SOLN
10.0000 meq | INTRAVENOUS | Status: AC
  Administered 2022-06-29 (×2): 10 meq via INTRAVENOUS
  Filled 2022-06-29 (×2): qty 100

## 2022-06-29 MED ORDER — HYDROCHLOROTHIAZIDE 25 MG PO TABS
25.0000 mg | ORAL_TABLET | Freq: Every day | ORAL | Status: DC
  Administered 2022-06-30 – 2022-07-02 (×3): 25 mg via ORAL
  Filled 2022-06-29 (×3): qty 1

## 2022-06-29 MED ORDER — ACETAMINOPHEN 325 MG PO TABS
650.0000 mg | ORAL_TABLET | ORAL | Status: DC | PRN
  Administered 2022-06-30: 650 mg via ORAL
  Filled 2022-06-29: qty 2

## 2022-06-29 NOTE — ED Provider Notes (Signed)
[facility] EMERGENCY DEPARTMENT Provider Note   CSN: [id number] Arrival date & time: 06/29/22  1333     History  Chief Complaint  Patient presents with   BH/ Not eating   Failure To Thrive    Pamela Graham is a 67 y.o. female.  With PMH of depression, bipolar disorder, HTN who is brought in by EMS today after family's concern for patient not eating or being interactive as usual.  I spoke with Manus Gunning patient's sister who provided majority of patient's HPI.  Patient lives alone.  However Zofran patient sister concerned because patient has not been acting normally.  She is concerned for severe depression.  She has not been eating or drinking she has been staying up all night and not getting much sleep.  She does not think she is taking her medicines.  Typically she is lively and talkative however she has not been talking often and has been speaking in a whisper.  She has not had any focal weakness or recent falls.  She has had no slurred speech but more speaking in a whisper.  Her nephew did recently die in October.  She has had no vomiting or diarrhea per patient's sister.  She denies any recent fevers or illness.  Patient denying any medical complaints.  Patient denying any hallucinations or SI/HI.  Patient is open to psychiatric evaluation and voluntary commitment.   Of note patient was seen yesterday 06/28/2022 at [facility] behavioral health for walk-in assessment.  She had been psychiatrically cleared by Dr. Dwyane Dee and provided with outpatient resources.  HPI     Home Medications Prior to Admission medications   Medication Sig Start Date End Date Taking? Authorizing Provider  acetaminophen (TYLENOL) 500 MG tablet Take 1,000 mg by mouth every 4 (four) hours as needed (for fibromyalgia-related pain).    [provider]  carbamazepine (TEGRETOL XR) 200 MG 12 hr tablet Take 200 mg by mouth 2 (two) times daily.    [provider]  carbamazepine  (TEGRETOL) 100 MG chewable tablet Chew 1 tablet (100 mg total) by mouth 2 (two) times a day. Patient not taking: No sig reported 11/25/18   Johnn Hai, MD  cephALEXin (KEFLEX) 500 MG capsule Take 1 capsule (500 mg total) by mouth 3 (three) times daily. 07/17/21   [name], MD  clonazePAM (KLONOPIN) 0.5 MG tablet Take 0.5 tablets (0.25 mg total) by mouth 2 (two) times daily. Patient taking differently: Take 0.5 mg by mouth 3 (three) times daily as needed for anxiety. 11/25/18   Johnn Hai, MD  cyclobenzaprine (FLEXERIL) 10 MG tablet Take 10 mg by mouth 2 (two) times daily as needed for muscle spasms.    [provider]  cyclobenzaprine (FLEXERIL) 5 MG tablet Take 1 tablet (5 mg total) by mouth 3 (three) times daily. Patient not taking: Reported on 07/03/2020 11/25/18   Johnn Hai, MD  cyclobenzaprine (FLEXERIL) 5 MG tablet Take 1 tablet (5 mg total) by mouth 2 (two) times daily as needed for muscle spasms. 07/03/20   Tegeler, Gwenyth Allegra, MD  diphenhydramine-acetaminophen (TYLENOL PM) 25-500 MG TABS tablet Take 4 tablets by mouth at bedtime.    [provider]  DULoxetine (CYMBALTA) 30 MG capsule Take 90 mg by mouth daily.    [provider]  hydrochlorothiazide (HYDRODIURIL) 25 MG tablet Take 1 tablet (25 mg total) by mouth daily. Patient not taking: Reported on 07/03/2020 08/23/17   Money, Lowry Ram, FNP  HYDROcodone-acetaminophen (NORCO/VICODIN) 5-325 MG tablet  Take 2 tablets by mouth every 4 (four) hours as needed. Patient not taking: Reported on 07/03/2020 01/20/19   Mickie Bail, NP  lidocaine (LIDODERM) 5 % Place 1 patch onto the skin daily. Remove & Discard patch within 12 hours or as directed by MD 07/03/20   Tegeler, Canary Brim, MD  loratadine (CLARITIN) 10 MG tablet Take 10 mg by mouth daily as needed for allergies or rhinitis.    [provider]  metoprolol succinate (TOPROL-XL) 25 MG 24 hr tablet Take 25 mg by mouth daily. 05/25/20   [provider]  omeprazole (PRILOSEC) 40 MG capsule Take 40 mg by mouth daily before breakfast. 11/20/18   [provider]  oxyCODONE (ROXICODONE) 5 MG immediate release tablet Take 1 tablet (5 mg total) by mouth every 4 (four) hours as needed for severe pain. 02/18/21   Sabas Sous, MD  perphenazine (TRILAFON) 4 MG tablet 1 in am 2 at h s Patient not taking: Reported on 07/03/2020 11/25/18   Malvin Johns, MD  prazosin (MINIPRESS) 2 MG capsule Take 2 capsules (4 mg total) by mouth at bedtime. Patient not taking: Reported on 07/03/2020 11/25/18   Malvin Johns, MD  prazosin (MINIPRESS) 5 MG capsule Take 5 mg by mouth at bedtime as needed (for night terrors).    [provider]  temazepam (RESTORIL) 30 MG capsule Take 1 capsule (30 mg total) by mouth at bedtime. Patient not taking: Reported on 07/03/2020 11/25/18   Malvin Johns, MD  TURMERIC PO Take 1 capsule by mouth daily with breakfast.    [provider]  zolpidem (AMBIEN) 10 MG tablet Take 10 mg by mouth at bedtime as needed for sleep. 05/25/20   [provider]      Allergies    Haloperidol, Lisinopril, Other, Risperidone and related, Gabapentin, Ibuprofen, Meloxicam, Penicillins, Pregabalin, and Tramadol    Review of Systems   Review of Systems  Physical Exam Updated Vital Signs BP (!) 147/97 (BP Location: Right Arm)   Pulse (!) 106   Temp 98.3 F (36.8 C) (Oral)   Resp 16   Ht 5\' 7"  (1.702 m)   Wt 100.7 kg   SpO2 96%   BMI 34.77 kg/m  Physical Exam Constitutional: Alert and orientedx4 speaking in whisper, NAD< nontoxic Eyes: Conjunctivae are normal. ENT      Head: Normocephalic and atraumatic.      Nose: No congestion.      Mouth/Throat: Mucous membranes are moist.      Neck: No stridor. Cardiovascular: S1, S2,  Normal and symmetric distal pulses are present in all extremities.Warm and well perfused. Respiratory: Normal respiratory effort. Breath sounds are normal. O2 sat 100 on  RA Gastrointestinal: Soft and nontender.  Musculoskeletal: Normal range of motion in all extremities. Neurologic: Normal speech and language.  CN II through XII grossly intact.  5 out of 5 strength bilateral upper and lower extremities.  Sensation grossly intact.  No gross focal neurologic deficits are appreciated. Skin: Skin is warm, dry and intact. No rash noted. Psychiatric: Mood and affect are depressed and subdued  ED Results / Procedures / Treatments   Labs (all labs ordered are listed, but only abnormal results are displayed) Labs Reviewed  COMPREHENSIVE METABOLIC PANEL - Abnormal; Notable for the following components:      Result Value   Potassium 2.7 (*)    Glucose, Bld 119 (*)    Creatinine, Ser 1.16 (*)    GFR, Estimated 53 (*)  All other components within normal limits  ETHANOL  CBC WITH DIFFERENTIAL/PLATELET  RAPID URINE DRUG SCREEN, HOSP PERFORMED  CARBAMAZEPINE LEVEL, TOTAL  MAGNESIUM    EKG EKG Interpretation  Date/Time:  Saturday June 29 2022 16:04:16 EST Ventricular Rate:  100 PR Interval:  126 QRS Duration: 80 QT Interval:  346 QTC Calculation: 446 R Axis:   -8 Text Interpretation: Normal sinus rhythm Minimal voltage criteria for LVH, may be normal variant ( Cornell product ) Borderline ECG When compared with ECG of 17-Jul-2021 13:56, PREVIOUS ECG IS PRESENT QTc 446 No acute ST/T changes Confirmed by Georgina Snell [phone]) on 06/29/2022 4:12:29 PM  Radiology No results found.  Procedures .Critical Care  Performed by: Elgie Congo, MD Authorized by: Elgie Congo, MD   Critical care provider statement:    Critical care time (minutes):  30   Critical care was necessary to treat or prevent imminent or life-threatening deterioration of the following conditions:  Metabolic crisis   Critical care was time spent personally by me on the following activities:  Development of treatment plan with patient or surrogate, discussions with  consultants, evaluation of patient's response to treatment, examination of patient, ordering and review of laboratory studies, ordering and review of radiographic studies, ordering and performing treatments and interventions, pulse oximetry, re-evaluation of patient's condition, review of old charts and obtaining history from patient or surrogate (hypokalemia requiring IV repletion and po repletion)     Medications Ordered in ED Medications  lactated ringers bolus 1,000 mL (has no administration in time range)  potassium chloride SA (KLOR-CON M) CR tablet 60 mEq (has no administration in time range)  potassium chloride 10 mEq in 100 mL IVPB (has no administration in time range)    ED Course/ Medical Decision Making/ A&P   {    Medical Decision Making Shakendra Griffeth is a 67 y.o. female.  With PMH of depression, bipolar disorder, HTN who is brought in by EMS today after family's concern for patient not eating or being interactive as usual.  Patient denying SI/HI/AVH however she has history of bipolar and is noncompliant with medications.  Her mood is more depressed on exam however family notes she has been not been sleeping at night concerning for possible manic symptoms.  She is voluntary.  Her carbamazepine level was undetected consistent with noncompliance.  CT head which I personally reviewed unremarkable.  Labs reviewed with only mild hypokalemia 2.7 with normal magnesium 2.1.  She had no QTc prolongation on EKG.  QTc was 446.  There were no acute ST/T wave changes concerning for ischemia.  She has no medical complaints.  She was given bolus of IV fluids, IV and p.o. potassium repletion and had improvement of creatinine to 1.11 and potassium to 3.0.  With no acute QTc prolongation or hemodynamic instability or other concerning findings on workup, she is medically cleared.  I have ordered for 3 more days of p.o. repletion of potassium.  TTS to be consulted for increased depressive behavior and  manic like symptoms.  She is voluntary.  Amount and/or Complexity of Data Reviewed Labs: ordered. Radiology: ordered.  Risk OTC drugs. Prescription drug management.    Final Clinical Impression(s) / ED Diagnoses Final diagnoses:  Hypokalemia  AKI (acute kidney injury) [facility])    Rx / [place] Orders ED Discharge Orders     None         Elgie Congo, MD 06/29/22 2340

## 2022-06-29 NOTE — ED Triage Notes (Addendum)
Pt arrived via [facility] from home. Family states pt not eating or taking to them. They stated she is not interacting with them like  normal. ACT nurse was there and said this is behavioral issue. Pt told EMS she doesn't feel good and feel like interacting. PT has been compliant with meds. Family told EMS, pt had family member [place].

## 2022-06-29 NOTE — ED Notes (Signed)
Mother, Corene Cornea, will be the family contact person.

## 2022-06-29 NOTE — Discharge Instructions (Signed)
Make sure you follow-up with your primary care doctor regarding your low potassium levels.  You may need to continue taking potassium supplementation and have a recheck of your blood work within the next 1 to 2 weeks.  Come back if any severe weakness in 1 arm or 1 leg, slurred speech, visual changes, uncontrollable vomiting or diarrhea or any other symptoms concerning to you.

## 2022-06-29 NOTE — ED Provider Triage Note (Signed)
Emergency Medicine Provider Triage Evaluation Note  Pamela Graham , a 67 y.o. female  was evaluated in triage.  Pt complains of not eating or interacting with family like normal. She has been fatigued. Reports that she has been taking her medications. ACT Team saw her and said it was behavioral. Family called EMS for evaluation. Information from EMS and nurse. Patient just reports "I don't feel good".  I asked if she is having any suicidal homicidal ideations and she declines.  She declines any AVH.  I asked her about chest pain, abdominal pain, headaches, cough or cold symptoms which the patient just stares at me and shakes her head, unsure if this is a yes or no response to this.  She is not a cooperative historian.  Review of Systems  Positive:  Negative:   Physical Exam  BP (!) 147/97 (BP Location: Right Arm)   Pulse (!) 106   Temp 98.3 F (36.8 C) (Oral)   Resp 16   Ht 5\' 7"  (1.702 m)   Wt 100.7 kg   SpO2 96%   BMI 34.77 kg/m  Gen:   Awake, no distress   Resp:  Normal effort  MSK:   Moves extremities without difficulty  Other:  Does not appear to be in any acute distress.  Medical Decision Making  Medically screening exam initiated at 2:04 PM.  Appropriate orders placed.  Pamela Graham was informed that the remainder of the evaluation will be completed by another provider, this initial triage assessment does not replace that evaluation, and the importance of remaining in the ED until their evaluation is complete.  Patient was seen at [facility] yesterday.    Sherrell Puller, PA-C 06/29/22 1417

## 2022-06-29 NOTE — ED Notes (Signed)
Daughter Renold Don [phone] would like an update asap

## 2022-06-30 DIAGNOSIS — F332 Major depressive disorder, recurrent severe without psychotic features: Secondary | ICD-10-CM | POA: Diagnosis not present

## 2022-06-30 DIAGNOSIS — N179 Acute kidney failure, unspecified: Secondary | ICD-10-CM | POA: Diagnosis not present

## 2022-06-30 DIAGNOSIS — F32A Depression, unspecified: Secondary | ICD-10-CM | POA: Diagnosis present

## 2022-06-30 LAB — RAPID URINE DRUG SCREEN, HOSP PERFORMED
Amphetamines: NOT DETECTED
Barbiturates: NOT DETECTED
Benzodiazepines: NOT DETECTED
Cocaine: NOT DETECTED
Opiates: NOT DETECTED
Tetrahydrocannabinol: POSITIVE — AB

## 2022-06-30 LAB — RESP PANEL BY RT-PCR (RSV, FLU A&B, COVID)  RVPGX2
Influenza A by PCR: NEGATIVE
Influenza B by PCR: NEGATIVE
Resp Syncytial Virus by PCR: NEGATIVE
SARS Coronavirus 2 by RT PCR: NEGATIVE

## 2022-06-30 LAB — URINALYSIS, ROUTINE W REFLEX MICROSCOPIC
Bilirubin Urine: NEGATIVE
Glucose, UA: NEGATIVE mg/dL
Hgb urine dipstick: NEGATIVE
Ketones, ur: 5 mg/dL — AB
Nitrite: NEGATIVE
Protein, ur: NEGATIVE mg/dL
Specific Gravity, Urine: 1.021 (ref 1.005–1.030)
pH: 5 (ref 5.0–8.0)

## 2022-06-30 MED ORDER — OXYCODONE HCL 5 MG PO TABS
5.0000 mg | ORAL_TABLET | Freq: Once | ORAL | Status: AC
  Administered 2022-06-30: 5 mg via ORAL
  Filled 2022-06-30: qty 1

## 2022-06-30 MED ORDER — CLONIDINE HCL 0.1 MG PO TABS
0.1000 mg | ORAL_TABLET | Freq: Once | ORAL | Status: AC
  Administered 2022-06-30: 0.1 mg via ORAL
  Filled 2022-06-30: qty 1

## 2022-06-30 MED ORDER — CARBAMAZEPINE 100 MG PO CHEW
100.0000 mg | CHEWABLE_TABLET | Freq: Two times a day (BID) | ORAL | Status: DC
  Administered 2022-06-30 – 2022-07-02 (×5): 100 mg via ORAL
  Filled 2022-06-30 (×6): qty 1

## 2022-06-30 NOTE — ED Notes (Signed)
Patient assisted to the bedside commode urine sample sent.

## 2022-06-30 NOTE — Progress Notes (Signed)
CSW requested [facility] AC Antoinette Cillo, RN to review pt. [facility] AC agreed to review and advised that pt's BP has been  elevated, and needing COVID. Per Alver Sorrow, NP pt meets inpatient criteria, and has agreed to order COVID. Nursing added to communication Cristy Hilts, RN. CSW added first shift CSW Disposition Denna Haggard, LCSWA for follow up.   Benjaman Kindler, MSW, [facility] 06/30/2022 5:45 PM

## 2022-06-30 NOTE — ED Notes (Signed)
Not IVC'd per Dr Ralene Bathe

## 2022-06-30 NOTE — ED Notes (Signed)
IV pulled 
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
## 2022-06-30 NOTE — ED Notes (Signed)
Patient denies pain and is resting comfortably.  

## 2022-06-30 NOTE — Consult Note (Signed)
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  Patient was seen face to face by this provider and chart reviewed.   Per chart review, patient was brought to the ED via EMS after family was concerned about patient being depressed. Family is concerned about patient being severely depressed, not eating and not interacting with people. Per family, patient is a talkative person but has not been talking or interacting lately, she has been whispering.    On evaluation patient is alert and oriented x3, speech is clear and coherent. Patient's eye contact is fair, mood and affect is depressed. Patient's thought process is coherent and thought content is within normal limits. Patient denies SI HI, denies AVH, or paranoia. There is no indication that the patient is responding to internal stimuli and no delusion noted.    During evaluation today, patient did not talk much. She will whisper or nod her head in response to questions. Patient says my daughter brought me here, she is concerned about me. Patient states that "I think I am depressed". Patient acknowledged that she was not eating or interacting by nodding her head after being asked questions. Patient denies having a therapist or a psychiatrist.  Patient states she has used marijuana before but she denies alcohol use. She reported being in a mental health hospital before due to sadness, depression.  Patient denies taking any medication. Patient described her sleep as okay and her appetite is good.   Support, encouragement and reassurance provided about ongoing stressors and patient provided with opportunity for questions.      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   Past Medical History:  Past Medical History:  Diagnosis Date   Barrett's esophagus    DDD (degenerative disc disease), lumbar    Fibromyalgia    Gastric ulcer    Hypertension    Migraines     Past Surgical History:  Procedure Laterality Date   ABDOMINAL HYSTERECTOMY     COLONOSCOPY     UPPER GI ENDOSCOPY     Family History: History reviewed. No pertinent family history. Family Psychiatric  History: N/a Social History:  Social History   Substance and Sexual Activity  Alcohol Use No     Social History   Substance and Sexual Activity  Drug Use Yes   Types: Marijuana   Comment: Last used: Today.     Social History   Socioeconomic History   Marital status: Single    Spouse name: Not on file   Number of children: Not on file   Years of education: Not on file   Highest education level: Not on file  Occupational History   Not on file  Tobacco Use   Smoking status: Former    Years: 12.00    Types: Cigarettes   Smokeless tobacco: Never  Vaping Use   Vaping Use: Never used  Substance and Sexual Activity  Alcohol use: No   Drug use: Yes    Types: Marijuana    Comment: Last used: Today.    Sexual activity: Not Currently  Other Topics Concern   Not on file  Social History Narrative   Not on file   Social Determinants of Health   Financial Resource Strain: Not on file  Food Insecurity: Not on file  Transportation Needs: Not on file  Physical Activity: Not on file  Stress: Not on file  Social Connections: Not on file    Sleep: Good  Appetite:   Good  Current Medications: Current Facility-Administered Medications  Medication Dose Route Frequency Provider Last Rate Last Admin   acetaminophen (TYLENOL) tablet 650 mg  650 mg Oral Q4H PRN Elgie Congo, MD   650 mg at 06/30/22 1323   carbamazepine (TEGRETOL) tablet 100 mg  100 mg Oral BID Georgina Snell C, MD   100 mg at 06/30/22 1033   cloNIDine (CATAPRES) tablet 0.1 mg  0.1 mg Oral Once Keath Matera O, NP       hydrochlorothiazide (HYDRODIURIL) tablet 25 mg  25 mg Oral Daily Georgina Snell C, MD   25 mg at 06/30/22 1033   metoprolol succinate (TOPROL-XL) 24 hr tablet 25 mg  25 mg Oral Daily Georgina Snell C, MD   25 mg at 06/30/22 1033   ondansetron (ZOFRAN) tablet 4 mg  4 mg Oral Q8H PRN Elgie Congo, MD       potassium chloride SA (KLOR-CON M) CR tablet 40 mEq  40 mEq Oral Daily Georgina Snell C, MD   40 mEq at 06/30/22 1033   zolpidem (AMBIEN) tablet 5 mg  5 mg Oral QHS PRN Elgie Congo, MD       Current Outpatient Medications  Medication Sig Dispense Refill   acetaminophen (TYLENOL) 500 MG tablet Take 1,000 mg by mouth every 4 (four) hours as needed (for fibromyalgia-related pain).     carbamazepine (TEGRETOL XR) 200 MG 12 hr tablet Take 200 mg by mouth 2 (two) times daily.     carbamazepine (TEGRETOL) 100 MG chewable tablet Chew 1 tablet (100 mg total) by mouth 2 (two) times a day. (Patient not taking: No sig reported) 60 tablet 2   cephALEXin (KEFLEX) 500 MG capsule Take 1 capsule (500 mg total) by mouth 3 (three) times daily. 21 capsule 0   clonazePAM (KLONOPIN) 0.5 MG tablet Take 0.5 tablets (0.25 mg total) by mouth 2 (two) times daily. (Patient taking differently: Take 0.5 mg by mouth 3 (three) times daily as needed for anxiety.) 60 tablet 0   cyclobenzaprine (FLEXERIL) 10 MG tablet Take 10 mg by mouth 2 (two) times daily as needed for muscle spasms.     cyclobenzaprine (FLEXERIL) 5 MG tablet Take 1 tablet (5 mg total) by mouth 3 (three) times  daily. (Patient not taking: Reported on 07/03/2020) 90 tablet 2   cyclobenzaprine (FLEXERIL) 5 MG tablet Take 1 tablet (5 mg total) by mouth 2 (two) times daily as needed for muscle spasms. 20 tablet 0   diphenhydramine-acetaminophen (TYLENOL PM) 25-500 MG TABS tablet Take 4 tablets by mouth at bedtime.     DULoxetine (CYMBALTA) 30 MG capsule Take 90 mg by mouth daily.     hydrochlorothiazide (HYDRODIURIL) 25 MG tablet Take 1 tablet (25 mg total) by mouth daily. (Patient not taking: Reported on 07/03/2020) 30 tablet 0   HYDROcodone-acetaminophen (NORCO/VICODIN) 5-325 MG tablet Take 2 tablets by mouth every 4 (four) hours as needed. (Patient not  taking: Reported on 07/03/2020) 6 tablet 0   lidocaine (LIDODERM) 5 % Place 1 patch onto the skin daily. Remove & Discard patch within 12 hours or as directed by MD 15 patch 0   loratadine (CLARITIN) 10 MG tablet Take 10 mg by mouth daily as needed for allergies or rhinitis.     metoprolol succinate (TOPROL-XL) 25 MG 24 hr tablet Take 25 mg by mouth daily.     omeprazole (PRILOSEC) 40 MG capsule Take 40 mg by mouth daily before breakfast.     oxyCODONE (ROXICODONE) 5 MG immediate release tablet Take 1 tablet (5 mg total) by mouth every 4 (four) hours as needed for severe pain. 6 tablet 0   perphenazine (TRILAFON) 4 MG tablet 1 in am 2 at h s (Patient not taking: Reported on 07/03/2020) 90 tablet 2   prazosin (MINIPRESS) 2 MG capsule Take 2 capsules (4 mg total) by mouth at bedtime. (Patient not taking: Reported on 07/03/2020) 30 capsule 2   prazosin (MINIPRESS) 5 MG capsule Take 5 mg by mouth at bedtime as needed (for night terrors).     temazepam (RESTORIL) 30 MG capsule Take 1 capsule (30 mg total) by mouth at bedtime. (Patient not taking: Reported on 07/03/2020) 30 capsule 0   TURMERIC PO Take 1 capsule by mouth daily with breakfast.     zolpidem (AMBIEN) 10 MG tablet Take 10 mg by mouth at bedtime as needed for sleep.      Lab Results:  Results for orders  placed or performed during the hospital encounter of 06/29/22 (from the past 48 hour(s))  Comprehensive metabolic panel     Status: Abnormal   Collection Time: 06/29/22  2:36 PM  Result Value Ref Range   Sodium 139 135 - 145 mmol/L   Potassium 2.7 (LL) 3.5 - 5.1 mmol/L    Comment: CRITICAL RESULT CALLED TO, READ BACK BY AND VERIFIED WITH T,GLOSSON RN @1558  06/29/22 E,BENTON   Chloride 99 98 - 111 mmol/L   CO2 28 22 - 32 mmol/L   Glucose, Bld 119 (H) 70 - 99 mg/dL    Comment: Glucose reference range applies only to samples taken after fasting for at least 8 hours.   BUN 10 8 - 23 mg/dL   Creatinine, Ser 1.16 (H) 0.44 - 1.00 mg/dL   Calcium 9.3 8.9 - 10.3 mg/dL   Total Protein 6.9 6.5 - 8.1 g/dL   Albumin 4.0 3.5 - 5.0 g/dL   AST 29 15 - 41 U/L   ALT 20 0 - 44 U/L   Alkaline Phosphatase 95 38 - 126 U/L   Total Bilirubin 0.7 0.3 - 1.2 mg/dL   GFR, Estimated 53 (L) >60 mL/min    Comment: (NOTE) Calculated using the CKD-EPI Creatinine Equation (2021)    Anion gap 12 5 - 15    Comment: Performed at [facility] [address] [place], [place] [address]  Ethanol     Status: None   Collection Time: 06/29/22  2:36 PM  Result Value Ref Range   Alcohol, Ethyl (B) <10 <10 mg/dL    Comment: (NOTE) Lowest detectable limit for serum alcohol is 10 mg/dL.  For medical purposes only. Performed at [facility], [place] [address], [place], [place] [address]   CBC with Diff     Status: None   Collection Time: 06/29/22  2:36 PM  Result Value Ref Range   WBC 8.0 4.0 - 10.5 K/uL   RBC 4.74 3.87 -  5.11 MIL/uL   Hemoglobin 15.0 12.0 - 15.0 g/dL   HCT 43.3 36.0 - 46.0 %   MCV 91.4 80.0 - 100.0 fL   MCH 31.6 26.0 - 34.0 pg   MCHC 34.6 30.0 - 36.0 g/dL   RDW 13.0 11.5 - 15.5 %   Platelets 150 150 - 400 K/uL   nRBC 0.0 0.0 - 0.2 %   Neutrophils Relative % 62 %   Neutro Abs 4.9 1.7 - 7.7 K/uL   Lymphocytes Relative 25 %   Lymphs Abs 2.0 0.7 - 4.0 K/uL   Monocytes Relative 13 %    Monocytes Absolute 1.0 0.1 - 1.0 K/uL   Eosinophils Relative 0 %   Eosinophils Absolute 0.0 0.0 - 0.5 K/uL   Basophils Relative 0 %   Basophils Absolute 0.0 0.0 - 0.1 K/uL   Immature Granulocytes 0 %   Abs Immature Granulocytes 0.02 0.00 - 0.07 K/uL    Comment: Performed at [facility] [address] [place], [place] [address]  Carbamazepine level, total     Status: Abnormal   Collection Time: 06/29/22  2:36 PM  Result Value Ref Range   Carbamazepine Lvl <2.0 (L) 4.0 - 12.0 ug/mL    Comment: Performed at [facility] [address], [place], [place] [address]  Magnesium     Status: None   Collection Time: 06/29/22  4:12 PM  Result Value Ref Range   Magnesium 2.1 1.7 - 2.4 mg/dL    Comment: Performed at [facility] [address], [place], [place] [address]  Basic metabolic panel     Status: Abnormal   Collection Time: 06/29/22 10:19 PM  Result Value Ref Range   Sodium 138 135 - 145 mmol/L   Potassium 3.0 (L) 3.5 - 5.1 mmol/L   Chloride 101 98 - 111 mmol/L   CO2 26 22 - 32 mmol/L   Glucose, Bld 96 70 - 99 mg/dL    Comment: Glucose reference range applies only to samples taken after fasting for at least 8 hours.   BUN 8 8 - 23 mg/dL   Creatinine, Ser 1.11 (H) 0.44 - 1.00 mg/dL   Calcium 8.9 8.9 - 10.3 mg/dL   GFR, Estimated 56 (L) >60 mL/min    Comment: (NOTE) Calculated using the CKD-EPI Creatinine Equation (2021)    Anion gap 11 5 - 15    Comment: Performed at [facility] [address] [place], [place] [address]    Blood Alcohol level:  Lab Results  Component Value Date   ETH <10 06/29/2022   ETH <10 06/12/2020    Physical Findings: AIMS:  , ,  ,  ,    CIWA:    COWS:     Musculoskeletal: Strength & Muscle Tone: within normal limits Gait & Station: normal Patient leans: N/A  Psychiatric Specialty Exam:  Presentation  General Appearance:  Appropriate for Environment  Eye Contact: Fair  Speech: Clear and  Coherent  Speech Volume: Normal  Handedness: Right   Mood and Affect  Mood: Depressed  Affect: Depressed   Thought Process  Thought Processes: Coherent  Descriptions of Associations:Intact  Orientation:Full (Time, Place and Person)  Thought Content:WDL  History of Schizophrenia/Schizoaffective disorder:No  Duration of Psychotic Symptoms:No data recorded Hallucinations:Hallucinations: None  Ideas of Reference:None  Suicidal Thoughts:Suicidal Thoughts: No  Homicidal Thoughts:Homicidal Thoughts: No   Sensorium  Memory: Immediate Good  Judgment: Fair  Insight: Fair   Executive Functions  Concentration: Good  Attention Span: Good  Recall: Good  Fund of Knowledge: Good  Language: Good   Psychomotor Activity  Psychomotor Activity: Psychomotor Activity: Normal   Assets  Assets: Desire for Improvement; Social Support; Resilience   Sleep  Sleep: Sleep: Good    Physical Exam: Physical Exam Vitals reviewed.  Constitutional:      Appearance: Normal appearance.  HENT:     Nose: No congestion.  Eyes:     General:        Right eye: No discharge.        Left eye: No discharge.  Cardiovascular:     Rate and Rhythm: Normal rate.     Pulses: Normal pulses.  Pulmonary:     Effort: No respiratory distress.     Breath sounds: No wheezing.  Neurological:     Mental Status: She is alert and oriented to person, place, and time.     Motor: No weakness.     Coordination: Coordination normal.  Psychiatric:        Mood and Affect: Mood is depressed.        Speech: Speech normal.        Behavior: Behavior normal.        Thought Content: Thought content is not paranoid. Thought content does not include homicidal or suicidal ideation.        Judgment: Judgment normal.    Review of Systems  Constitutional:  Negative for fever.  HENT:  Negative for ear discharge, hearing loss and nosebleeds.   Eyes:  Negative for pain and discharge.   Respiratory:  Negative for cough, shortness of breath and wheezing.   Gastrointestinal:  Negative for abdominal pain, nausea and vomiting.  Neurological:  Negative for dizziness, seizures, weakness and headaches.  Psychiatric/Behavioral:  Positive for depression. Negative for hallucinations, substance abuse and suicidal ideas.    Blood pressure (!) 179/87, pulse 74, temperature 98 F (36.7 C), temperature source Oral, resp. rate (!) 22, height 5\' 7"  (1.702 m), weight 100.7 kg, SpO2 100 %. Body mass index is 34.77 kg/m.  Treatment Plan Summary: Plan : Patient will be admitted into inpatient psychiatric unit for treatment after medically cleared.     , NP 06/30/2022, 1:56 PM

## 2022-06-30 NOTE — ED Notes (Signed)
Missing dose of night medication requested.

## 2022-06-30 NOTE — ED Notes (Signed)
Patient speaking with TTS.  

## 2022-07-01 DIAGNOSIS — N179 Acute kidney failure, unspecified: Secondary | ICD-10-CM | POA: Diagnosis not present

## 2022-07-01 LAB — BASIC METABOLIC PANEL
Anion gap: 16 — ABNORMAL HIGH (ref 5–15)
BUN: 8 mg/dL (ref 8–23)
CO2: 26 mmol/L (ref 22–32)
Calcium: 9.4 mg/dL (ref 8.9–10.3)
Chloride: 96 mmol/L — ABNORMAL LOW (ref 98–111)
Creatinine, Ser: 1.11 mg/dL — ABNORMAL HIGH (ref 0.44–1.00)
GFR, Estimated: 56 mL/min — ABNORMAL LOW (ref >60)
Glucose, Bld: 104 mg/dL — ABNORMAL HIGH (ref 70–99)
Potassium: 3.4 mmol/L — ABNORMAL LOW (ref 3.5–5.1)
Sodium: 138 mmol/L (ref 135–145)

## 2022-07-01 NOTE — ED Notes (Addendum)
Clara from [facility] called this RN to give acceptance to unit.  Clara requested an RN to call transfer of care report on pt after 7am on 07/02/2022 with expected transfer to unit in the morning.   Pt was accepted to [facility] 07/02/2022. Bed assignment: Janora Norlander  Pt meets inpatient criteria per Merlyn Lot, NP  Attending Physician will be Dr. Alcide Clever, MD  Report can be called to: [phone]  Pt can arrive after 7 AM

## 2022-07-01 NOTE — ED Notes (Signed)
PT up to bathroom with stable gait. Pt's affect remains flat and doesn't interact with this RN but will follow commands. Pt is calm and cooperative.

## 2022-07-01 NOTE — ED Notes (Signed)
NAD noted, respirations are equal bilaterally and unlabored at this time. Pt resting in gurney and denies any unmet needs. Pt connected to pulseox & BP. Call light within reach. 

## 2022-07-01 NOTE — ED Notes (Addendum)
This RN updated pt's daughter Bertha Stakes at [phone] and requested she update to family. She verbalized understanding and denied any further questions.

## 2022-07-01 NOTE — ED Notes (Signed)
This RN assumed care of patient. Pt is A&Ox4, ABCs intact, NAD noted at this time. Pt presents with flat affect and doesn't initiate interaction. She will respond and follow commands.

## 2022-07-01 NOTE — ED Provider Notes (Addendum)
Emergency Medicine Observation Re-evaluation Note  Pamela Graham is a 67 y.o. female, seen on rounds today.  Pt initially presented to the ED for complaints of BH/ Not eating and Failure To Thrive Currently, the patient is resting.  Physical Exam  BP (!) 164/105 (BP Location: Right Arm)   Pulse 89   Temp 98.6 F (37 C) (Oral)   Resp 16   Ht 5\' 7"  (1.702 m)   Wt 100.7 kg   SpO2 99%   BMI 34.77 kg/m  Physical Exam General: NAD Cardiac: well perfused Lungs: even and unlabored Psych: no agitation  ED Course / MDM  EKG:EKG Interpretation  Date/Time:  Saturday June 29 2022 16:04:16 EST Ventricular Rate:  100 PR Interval:  126 QRS Duration: 80 QT Interval:  346 QTC Calculation: 446 R Axis:   -8 Text Interpretation: Normal sinus rhythm Minimal voltage criteria for LVH, may be normal variant ( Cornell product ) Borderline ECG When compared with ECG of 17-Jul-2021 13:56, PREVIOUS ECG IS PRESENT QTc 446 No acute ST/T changes Confirmed by Georgina Snell [phone]) on 06/29/2022 4:12:29 PM  I have reviewed the labs performed to date as well as medications administered while in observation.  Recent changes in the last 24 hours include evaluated by TTS who recommended inpatient treatment. Will recheck BMP today as pt presented hypokalemic.  1330 Repeat BMP with improvement in serum potassium to 3.4. Pt is on daily supplementation.  Plan  Current plan is for inpatient psychiatric admission.    Regan Lemming, MD 07/01/22 [id number]    Regan Lemming, MD 07/01/22 [phone]

## 2022-07-01 NOTE — Progress Notes (Signed)
Pt was accepted to [facility] 07/02/2022. Bed assignment: Pamela Graham  Pt meets inpatient criteria per Merlyn Lot, NP  Attending Physician will be Dr. Alcide Clever, MD  Report can be called to: [phone]  Pt can arrive after 7 AM  Care Team Notified: Merlyn Lot, NP and Joselyn Arrow, RN  [place], [place]  07/01/2022 3:35 PM

## 2022-07-01 NOTE — ED Notes (Signed)
This RN spoke with pt's daughter. Daughter stated she is concerned for her safety at home since pt lives alone and would like to explore options for a assisted living.

## 2022-07-01 NOTE — ED Notes (Signed)
Provider aware of BP and d/t lack of symptoms provider will keep her on current outpatient BP meds.

## 2022-07-01 NOTE — Progress Notes (Signed)
LCSW Progress Note  [id number]   Pamela Graham  07/01/2022  2:10 PM  Description:   Inpatient Psychiatric Referral  Patient was recommended inpatient per Merlyn Lot, NP. There are no available beds at [facility]. Patient was referred to the following facilities:   Destination  Service Provider Address Phone Fax  [facility]  [address] [place] [address] [facility]  [facility]  [address] [place] [address] [phone]  [facility]  [address], [place] [address] [place]  [facility]  [address] [place] [address] [place] [address] [phone] [facility]  [place], [place] [address] [phone] [facility]  [address] [place], [place] [address] [phone] [facility]  [place] [place] [address] [place]  [facility]  [address] [place] [address] [phone]  [facility]  [address], [place] [address] [phone]  [facility]  [address], [place] [address] [phone] [facility]  [address], [place] [address] [phone]  [facility]  [address], [place] [address] [phone]  [facility]  [address], [place] [address] [phone] [place]  [address], [place] [address] [phone]  [facility]  [address], [place] [address] [phone]  [facility]  [place], [place] [address] [phone] [facility]  [address], [place] [address] [phone] [facility]  [place], [place] [address] [phone] [place] [address] [phone] [place], [place] [address] [phone]  [facility]  [address] [place] [address] [phone]      Situation ongoing, CSW to continue following and update chart as more information becomes available.      Denna Haggard, [place]  07/01/2022 2:10 PM

## 2022-07-01 NOTE — ED Notes (Signed)
NAD noted, respirations are equal bilaterally and unlabored at this time. Pt resting in gurney and denies any unmet needs. Pt connected to CCM, pulseox & BP. Call light within reach. 

## 2022-07-02 DIAGNOSIS — N179 Acute kidney failure, unspecified: Secondary | ICD-10-CM | POA: Diagnosis not present

## 2022-07-02 MED ORDER — POTASSIUM CHLORIDE CRYS ER 20 MEQ PO TBCR: 40.0000 meq | EXTENDED_RELEASE_TABLET | Freq: Once | ORAL | Status: DC

## 2022-07-02 MED ORDER — OLANZAPINE 5 MG PO TBDP
2.5000 mg | ORAL_TABLET | Freq: Every day | ORAL | Status: DC
  Administered 2022-07-02: 2.5 mg via ORAL
  Filled 2022-07-02: qty 1

## 2022-07-02 NOTE — ED Notes (Signed)
Pt belongings placed in locker 3 

## 2022-07-02 NOTE — ED Notes (Addendum)
[phone] pt mom would like a update on her plan of care. [place] pt husband called for a update also.

## 2022-07-02 NOTE — ED Notes (Signed)
Patient 25% of dinner

## 2022-07-02 NOTE — ED Notes (Signed)
Patient ambulatory to restroom. Sheets changed. Patient back in room and compliant with taking medications. Breakfast set up and patient beginning to eat breakfast

## 2022-07-02 NOTE — ED Notes (Addendum)
Writer spoke with Kia at [facility] and gave her and update on patient based on available information in chart as well as report from previous RN.

## 2022-07-02 NOTE — ED Notes (Signed)
[name] (sister) of Pamela Graham called asking for a update. Her number is [phone].

## 2022-07-02 NOTE — ED Notes (Signed)
Report attempted to [facility]

## 2022-07-02 NOTE — ED Provider Notes (Signed)
Emergency Medicine Observation Re-evaluation Note  Pamela Graham is a 68 y.o. female, seen on rounds today.  Pt initially presented to the ED for complaints of BH/ Not eating and Failure To Thrive Pt with hx bipolar, has been accepted at [facility] this AM.  No new c/o this AM.  Physical Exam  BP 103/86   Pulse (!) 101   Temp 98.1 F (36.7 C)   Resp 16   Ht 1.702 m (5\' 7" )   Wt 100.7 kg   SpO2 94%   BMI 34.77 kg/m  Physical Exam General: content, nad.  Cardiac: regular rate.  Lungs: breathing comfortably. Psych: calm, content appearing.   ED Course / MDM    I have reviewed the labs performed to date as well as medications administered wED obs, med management, reassessment.   Plan  Current plan is for ED obs, BH eval, med management, reassessment.   K improved, mildly low. Kcl po.   Pt accepted [facility], Dr Alcide Clever. Pt currently appears stable for transfer/transport.     Pamela Saver, MD 07/02/22 [phone]

## 2022-07-02 NOTE — ED Notes (Signed)
RN at [facility] refusing to take report and refusing to accept patient. RN states that she is going to call intake because "what are we supposed to do with the patient if she is not eating"

## 2022-07-03 NOTE — ED Notes (Signed)
[facility] discharged [facility]

## 2022-07-03 NOTE — Progress Notes (Addendum)
Pt was accepted to [facility] 07/03/22; Bed Assignment B  Pt meets inpatient criteria per Merlyn Lot, NP  Attending Physician will be Claudie Revering, MD  Report can be called to: [phone]  Pt can arrive after 8am  Nursing notified: Jacqulyn Bath, RN  Nadara Mode, [place] 07/03/2022 @ 12:22 AM

## 2022-07-03 NOTE — ED Provider Notes (Signed)
Emergency Medicine Observation Re-evaluation Note  Vlasta Baskin is a 67 y.o. female, seen on rounds today.  Pt initially presented to the ED for complaints of BH/ Not eating and Failure To Thrive Pt with hx bipolar, has been accepted at [facility] yesterday AM.   Physical Exam  BP 119/86 (BP Location: Left Arm)   Pulse 91   Temp 98 F (36.7 C) (Oral)   Resp 15   Ht 5\' 7"  (1.702 m)   Wt 100.7 kg   SpO2 98%   BMI 34.77 kg/m  Physical Exam General: NAD Cardiac: Well perfused Lungs: breathing comfortably. Psych: calm, content appearing.   ED Course / MDM    I have reviewed the labs performed to date as well as medications administered wED obs, med management, reassessment.   Plan  Current plan is for ED obs, BH eval, med management, reassessment.    Pt accepted to [facility] yesterday, Dr Alcide Clever. Pt currently appears stable for transfer/transport. EMTALA updated.        Regan Lemming, MD 07/03/22 [phone]

## 2022-07-03 NOTE — ED Notes (Signed)
Writer updated patient's daughter on the phone.

## 2022-07-18 ENCOUNTER — Other Ambulatory Visit: Payer: Self-pay

## 2022-07-18 ENCOUNTER — Ambulatory Visit (INDEPENDENT_AMBULATORY_CARE_PROVIDER_SITE_OTHER)
Admission: EM | Admit: 2022-07-18 | Discharge: 2022-07-18 | Disposition: A | Discharge status: Home / Self Care | Payer: Medicare HMO | Source: Home / Self Care

## 2022-07-18 ENCOUNTER — Emergency Department (HOSPITAL_COMMUNITY)
Admission: EM | Admit: 2022-07-18 | Discharge: 2022-07-18 | Disposition: A | Discharge status: Home / Self Care | Payer: Medicare HMO | Attending: Student | Admitting: Student

## 2022-07-18 DIAGNOSIS — Z1152 Encounter for screening for COVID-19: Secondary | ICD-10-CM | POA: Insufficient documentation

## 2022-07-18 DIAGNOSIS — F259 Schizoaffective disorder, unspecified: Secondary | ICD-10-CM | POA: Insufficient documentation

## 2022-07-18 DIAGNOSIS — I10 Essential (primary) hypertension: Secondary | ICD-10-CM | POA: Diagnosis not present

## 2022-07-18 DIAGNOSIS — Z79899 Other long term (current) drug therapy: Secondary | ICD-10-CM | POA: Insufficient documentation

## 2022-07-18 DIAGNOSIS — F32A Depression, unspecified: Secondary | ICD-10-CM | POA: Insufficient documentation

## 2022-07-18 DIAGNOSIS — Z760 Encounter for issue of repeat prescription: Secondary | ICD-10-CM | POA: Insufficient documentation

## 2022-07-18 DIAGNOSIS — Z20822 Contact with and (suspected) exposure to covid-19: Secondary | ICD-10-CM | POA: Insufficient documentation

## 2022-07-18 DIAGNOSIS — Z87891 Personal history of nicotine dependence: Secondary | ICD-10-CM | POA: Diagnosis not present

## 2022-07-18 LAB — BASIC METABOLIC PANEL
Anion gap: 14 (ref 5–15)
BUN: 7 mg/dL — ABNORMAL LOW (ref 8–23)
CO2: 20 mmol/L — ABNORMAL LOW (ref 22–32)
Calcium: 9.3 mg/dL (ref 8.9–10.3)
Chloride: 103 mmol/L (ref 98–111)
Creatinine, Ser: 1 mg/dL (ref 0.44–1.00)
GFR, Estimated: >60 mL/min (ref >60)
Glucose, Bld: 88 mg/dL (ref 70–99)
Potassium: 3.5 mmol/L (ref 3.5–5.1)
Sodium: 137 mmol/L (ref 135–145)

## 2022-07-18 LAB — CBC WITH DIFFERENTIAL/PLATELET
Abs Immature Granulocytes: 0.02 10*3/uL (ref 0.00–0.07)
Basophils Absolute: 0.1 10*3/uL (ref 0.0–0.1)
Basophils Relative: 1 %
Eosinophils Absolute: 0 10*3/uL (ref 0.0–0.5)
Eosinophils Relative: 0 %
HCT: 45.5 % (ref 36.0–46.0)
Hemoglobin: 14.9 g/dL (ref 12.0–15.0)
Immature Granulocytes: 0 %
Lymphocytes Relative: 24 %
Lymphs Abs: 2.3 10*3/uL (ref 0.7–4.0)
MCH: 31.5 pg (ref 26.0–34.0)
MCHC: 32.7 g/dL (ref 30.0–36.0)
MCV: 96.2 fL (ref 80.0–100.0)
Monocytes Absolute: 0.8 10*3/uL (ref 0.1–1.0)
Monocytes Relative: 9 %
Neutro Abs: 6.3 10*3/uL (ref 1.7–7.7)
Neutrophils Relative %: 66 %
Platelets: 176 10*3/uL (ref 150–400)
RBC: 4.73 MIL/uL (ref 3.87–5.11)
RDW: 13.3 % (ref 11.5–15.5)
WBC: 9.5 10*3/uL (ref 4.0–10.5)
nRBC: 0 % (ref 0.0–0.2)

## 2022-07-18 LAB — ACETAMINOPHEN LEVEL: Acetaminophen (Tylenol), Serum: <10 ug/mL — ABNORMAL LOW (ref 10–30)

## 2022-07-18 LAB — RAPID URINE DRUG SCREEN, HOSP PERFORMED
Amphetamines: NOT DETECTED
Barbiturates: NOT DETECTED
Benzodiazepines: NOT DETECTED
Cocaine: NOT DETECTED
Opiates: NOT DETECTED
Tetrahydrocannabinol: POSITIVE — AB

## 2022-07-18 LAB — RESP PANEL BY RT-PCR (RSV, FLU A&B, COVID)  RVPGX2
Influenza A by PCR: NEGATIVE
Influenza B by PCR: NEGATIVE
Resp Syncytial Virus by PCR: NEGATIVE
SARS Coronavirus 2 by RT PCR: NEGATIVE

## 2022-07-18 LAB — ETHANOL: Alcohol, Ethyl (B): <10 mg/dL (ref <10)

## 2022-07-18 LAB — URINALYSIS, ROUTINE W REFLEX MICROSCOPIC
Bilirubin Urine: NEGATIVE
Glucose, UA: NEGATIVE mg/dL
Hgb urine dipstick: NEGATIVE
Ketones, ur: 80 mg/dL — AB
Nitrite: NEGATIVE
Protein, ur: 30 mg/dL — AB
Specific Gravity, Urine: 1.026 (ref 1.005–1.030)
pH: 5 (ref 5.0–8.0)

## 2022-07-18 LAB — SALICYLATE LEVEL: Salicylate Lvl: <7 mg/dL — ABNORMAL LOW (ref 7.0–30.0)

## 2022-07-18 MED ORDER — ARIPIPRAZOLE 10 MG PO TABS
5.0000 mg | ORAL_TABLET | Freq: Once | ORAL | Status: AC
  Administered 2022-07-18: 5 mg via ORAL
  Filled 2022-07-18: qty 1

## 2022-07-18 MED ORDER — ARIPIPRAZOLE 5 MG PO TABS: 5.0000 mg | ORAL_TABLET | Freq: Two times a day (BID) | ORAL | 0 refills | Status: DC

## 2022-07-18 NOTE — ED Triage Notes (Signed)
Pt feels depressed and "blah" was recently discharged from [facility]. Pt denies SI states feeling of paranoia. Pt states she was given a prescription when discharged from [facility] but states she lost it. Pt states she came in today so she could get her medication but doesn't not know what the medication was. She states medicine was given while at [facility] and it helped while she was there.

## 2022-07-18 NOTE — Progress Notes (Signed)
   07/18/22 1248  [place] (Walk-ins at [facility] only)  How Did You Hear About [name]? Other (Comment) (ACT Team member brought her in)  What Is the Reason for Your Visit/Call Today? Pt is a 67 yo female who presented voluntarily and accompanied by Netty Starring, the Housing Specialist on her ACT Team. He was concerned that she is not at her baseline and he thinks she may be having psychotic sx. Per [name], pt does have a hc of Schizoaffective d/o. [name] stated she is not acting at her baseline. He as observed behavior that he thinks means she may be responding to internal stimuli and she is paranoif about returning to her home. [name] stated that she is also having memory problems like remembering the address of a couding that she visits often and trouble finding words and forming sentences. Pt denied SI, and denied any attempts. [name] stated that she ahs not attempted to kill herself since being with the ACT Team but he thinks he  remembers that she has had suicide attempts in the past. Pt denied HI, NSSH, AVH, paranoia and any substance use. [name] stated that she does have a hx of some small amoutn of cannabis use in the past. Her ATC Team in [facility]. [name] stated that she has recently been IP at [facility] and was just discharged he though within "the last week or two." She needed IP tx due to some paranoia and not eating per [name].  How Long Has This Been Causing You Problems? 1-6 months  Have You Recently Had Any Thoughts About Hurting Yourself? No  Are You Planning to Commit Suicide/Harm Yourself At This time? No  Have you Recently Had Thoughts About [place]? No  Are You Planning To Harm Someone At This Time? No  Explanation: na  Are you currently experiencing any auditory, visual or other hallucinations? No  Have You Used Any Alcohol or Drugs in the Past 24 Hours? No  What Did You Use and How Much? na  Do you have any current medical co-morbidities that require immediate attention?  No  Clinician description of patient physical appearance/behavior: Pt was calm, cooperative, alert and seemed a bit disoriented. It appeared that pt may have been responding to internal stimuli recently per ACT Team member. None observed during the assessment. Pt's mood was solemn and affect was flat. Pt's thinking and behavior was a bit disorganized. Pt seemed to have very little insight into her situation and condition.  What Do You Feel Would Help You the Most Today? Treatment for Depression or other mood problem  If access to [facility] was not available, would you have sought care in the Emergency Department? Yes  Determination of Need Urgent (48 hours)  Options For Referral [facility]   Ambrosio Reuter T. Mare Ferrari, [place], [facility], [facility] Triage Specialist [facility]

## 2022-07-18 NOTE — ED Provider Triage Note (Signed)
Emergency Medicine Provider Triage Evaluation Note  Pamela Graham , a 67 y.o. female  was evaluated in triage.  Pt complains of concerns for depression. Notes that she doesn't feel herself. She is taking medications for her depression. Denies SI/HI, auditory/visual hallucinations. Notes that she left [facility] today because they wanted her to do a 24 hour stay. Denies chest pain, shortness of breath, abdominal pain, nausea, vomiting, abdominal pain.    Per patient chart review: Patient was evaluated at behavioral health urgent care for her depression.  At that time it was noted that she can be safely discharged home.  At that time patient was denying SI, HI, auditory/visual hallucinations.  Review of Systems  Positive:  Negative:   Physical Exam  BP (!) 178/118 (BP Location: Left Arm)   Pulse (!) 121   Temp 99.2 F (37.3 C) (Oral)   Resp 15   SpO2 93%  Gen:   Awake, no distress   Resp:  Normal effort  MSK:   Moves extremities without difficulty  Other:  No chest wall TTP.   Medical Decision Making  Medically screening exam initiated at 2:57 PM.  Appropriate orders placed.  Ariyan Sinnett was informed that the remainder of the evaluation will be completed by another provider, this initial triage assessment does not replace that evaluation, and the importance of remaining in the ED until their evaluation is complete.  Work-up initiated.   3:26 PM - recheck of vitals, heart rate at 119 at rest. Labs ordered.    Mikah Rottinghaus A, PA-C 07/18/22 1528

## 2022-07-18 NOTE — ED Provider Notes (Addendum)
Behavioral Health Urgent Care Medical Screening Exam  Patient Name: Pamela Graham MRN: [id number] Date of Evaluation: 07/18/22 Chief Complaint: "My depression, I guess, is bad. I don't want to go home by myself."  Diagnosis:  Final diagnoses:  Depression, unspecified depression type   History of Present illness: Pamela Graham is a 67 y.o. female. Pt presents voluntarily to [facility] behavioral health for walk-in assessment.  Pt is accompanied by her ACT team housing specialist, Pamela Graham, who remains with pt throughout the assessment as per pt verbal consent/request. Pt  Pt is assessed face-to-face by nurse practitioner.   Pamela Graham, 68 y.o., female patient seen face to face by this provider, consulted with Dr. Dwyane Dee; and chart reviewed on 07/18/22. Per chart review, pt with history of mdd, bipolar 1 disorder, depression. Per Pamela Graham, pt w/ history of schizoaffective disorder. Pt was recently at [facility] from 06/29/22-07/03/22 due to not eating and failure to thrive. She was transferred from [facility] to [facility] on 07/03/22 for inpatient psychiatric admission.  On evaluation, when asked reason for presenting today, Pamela Graham reports "My depression, I guess, is bad. I don't want to go home by myself." When asked reason she does not want to be home, she states she does not know. When asked if there is anything at home that is preventing her from going in, she states there is not. She denies being fearful in the home.   Pt reports depressed mood. When asked how long she has been experiencing depressed mood, she reports "a while but I'm dealing with it". Pt reports she is eating 3 meals/day which she has been preparing on her own. When asked about sleep, she reports she has never been an 8 hour sleeper. She states her sleep is fair. She states she is sleeping more than 5 hours, less than 8 hours a night.   Pt denies she is experiencing suicidal, homicidal or violent ideations. She denies auditory visual  hallucinations or paranoia.   She denies use of alcohol, marijuana, crack/cocaine, other substances.   She reports she is living alone in a house. She reports she has been living alone for the past 2 to 3 years.  Pt denies access to a firearm or other weapon.  Pt reports she is not working.  Per Pamela Graham, there was a housing meeting today where they talked about independent living vs assisted living. They also discussed possibility of having home health aide services. After the meeting, pt did not want to go home. Pt had told Pamela Graham that she could go to her cousin's house, although she could not recall the address, which Pamela Graham states is not like her. He states pt earlier stated she experiences suicidal thoughts sometimes, does not have a plan or intent to act on a plan. He states pt was responding to internal stimuli earlier today.   On assessment, pt is a&ox4 (self, situation, place, date), in no acute distress, non-toxic appearing. She is casually dressed, appropriate for environment, fairly groomed. She makes minimal eye contact. Speech is clear and coherent, w/ nml rate and volume. Reported mood is depressed. Affect is flat. Thought process is coherent, linear, with description of associations intact. Thought content is logical. There is no evidence of internal preoccupation, agitation, aggression or distractibility. Possible paranoia/delusions around going home. Pt is calm, cooperative.  Discussed w/ pt and [name] that pt is not meeting criteria for inpatient psychiatric admission. Discussed possible admission to continuous assessment, which pt declined. Discussed close follow-up with ACT team. Pt's  next ACT team visit is next week Monday. Pt verbalizes understanding to call 911/EMS, go to the nearest emergency room or crisis center for safety concerns. Discussed pt is also able to call ACT crisis hotline.  Pt vital signs: temp 97.9, hr 79, b/p 140/89, rr 18, spo2 98% on room air.  Pamela Graham ED  from 07/18/2022 in [facility] ED from 06/29/2022 in [facility] Emergency Department at [facility] ED from 06/28/2022 in [place] No Risk No Risk No Risk       Psychiatric Specialty Exam  Presentation  General Appearance:Appropriate for Environment; Casual; Fairly Groomed  Eye Contact:Minimal  Speech:Clear and Coherent; Normal Rate  Speech Volume:Normal  Handedness:Right   Mood and Affect  Mood: Depressed  Affect: Flat   Thought Process  Thought Processes: Coherent; Linear  Descriptions of Associations:Intact  Orientation:Full (Time, Place and Person)  Thought Content:Logical  Diagnosis of Schizophrenia or Schizoaffective disorder in past: Yes   Hallucinations:None  Ideas of Reference:None  Suicidal Thoughts:No  Homicidal Thoughts:No   Sensorium  Memory: Immediate Good  Judgment: Intact  Insight: Shallow   Executive Functions  Concentration: Fair  Attention Span: Fair  Recall: [place] of Knowledge: Fair  Language: Good   Psychomotor Activity  Psychomotor Activity: Normal   Assets  Assets: Communication Skills; Desire for Improvement   Sleep  Sleep: Fair  Number of hours: 0 (she states more than 5 hours, less than 8 hours)   Physical Exam: Physical Exam Constitutional:      General: She is not in acute distress.    Appearance: Normal appearance. She is not ill-appearing, toxic-appearing or diaphoretic.  Eyes:     General: No scleral icterus. Cardiovascular:     Rate and Rhythm: Normal rate.  Pulmonary:     Effort: Pulmonary effort is normal.  Skin:    General: Skin is warm and dry.  Neurological:     Mental Status: She is alert and oriented to person, place, and time.  Psychiatric:        Attention and Perception: Perception normal.        Mood and Affect: Mood is depressed. Affect is flat.        Speech: Speech  normal.        Behavior: Behavior normal. Behavior is cooperative.        Thought Content: Thought content normal.    Review of Systems  Constitutional:  Negative for chills and fever.  Cardiovascular:  Negative for chest pain and palpitations.  Gastrointestinal:  Negative for abdominal pain.  Neurological:  Negative for headaches.  Psychiatric/Behavioral:  Positive for depression.    There were no vitals taken for this visit. There is no height or weight on file to calculate BMI.  Musculoskeletal: Strength & Muscle Tone: within normal limits Gait & Station: normal Patient leans: N/A  [place] MSE Discharge Disposition for Follow up and Recommendations: Based on my evaluation the patient does not appear to have an emergency medical condition and can be discharged with resources and follow up care in outpatient services for Medication Management and Individual Therapy  Tharon Aquas, NP 07/18/2022, 2:08 PM

## 2022-07-18 NOTE — Discharge Instructions (Signed)
Please follow up with your ACT team for outpatient services.   Patient is instructed prior to discharge to: Take all medications as prescribed by his/her mental healthcare provider. Report any adverse effects and or reactions from the medicines to his/her outpatient provider promptly. Keep all scheduled appointments, to ensure that you are getting refills on time and to avoid any interruption in your medication.  If you are unable to keep an appointment call to reschedule.  Be sure to follow-up with resources and follow-up appointments provided.  Patient has been instructed & cautioned: To not engage in alcohol and or illegal drug use while on prescription medicines. In the event of worsening symptoms, patient is instructed to call the crisis hotline, 911 and or go to the nearest ED for appropriate evaluation and treatment of symptoms. To follow-up with his/her primary care provider for your other medical issues, concerns and or health care needs.  Information: -National Suicide Prevention Lifeline 1-800-SUICIDE or 680-157-6035.  -988 offers 24/7 access to trained crisis counselors who can help people experiencing mental health-related distress. People can call or text 988 or chat 988lifeline.org for themselves or if they are worried about a loved one who may need crisis support.
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
## 2022-07-18 NOTE — ED Notes (Signed)
Pamela Graham with [facility]  [phone] mobile   [facility] crisis line [phone]  Office [phone]  Per Pamela Graham Please call crisis line after 5 with disposition information.

## 2022-07-19 NOTE — ED Provider Notes (Signed)
EMERGENCY DEPARTMENT AT [facility] Provider Note  CSN: [id number] Arrival date & time: 07/18/22 1431  Chief Complaint(s) Depression  HPI Pamela Graham is a 67 y.o. female with PMH peptic ulcer disease, HTN, migraines, bipolar 1 disorder who presents emergency department for evaluation of depression and medication refill.  Patient was at the [facility] earlier this morning and returns to the emergency department today to "see if her mental health is all right".  She is currently denying suicidal ideation, homicidal ideation, auditory or visual hallucinations.  Chart review indicates that there were no plans for inpatient psychiatric admission by [facility] but the patient left after being asked to stay for 24 hours for observation.  Patient states that she had Abilify mailed to her home and that this medicine helped her but she has since misplaced this medication.  Denies abdominal pain, nausea, vomiting, chest pain, shortness of breath or any medical symptoms today.   Past Medical History Past Medical History:  Diagnosis Date   Barrett's esophagus    DDD (degenerative disc disease), lumbar    Fibromyalgia    Gastric ulcer    Hypertension    Migraines    Patient Active Problem List   Diagnosis Date Noted   Depression 06/30/2022   Bipolar 1 disorder (HCC) 11/20/2018   Moderate bipolar I disorder, most recent episode depressed (HCC)    Major depressive disorder, recurrent severe without psychotic features (HCC) 08/08/2017   Home Medication(s) Prior to Admission medications   Medication Sig Start Date End Date Taking? Authorizing Provider  ARIPiprazole (ABILIFY) 5 MG tablet Take 1 tablet (5 mg total) by mouth in the morning and at bedtime. 07/18/22 08/17/22 Yes Wadsworth Skolnick, MD  acetaminophen (TYLENOL) 500 MG tablet Take 1,000 mg by mouth every 4 (four) hours as needed (for fibromyalgia-related pain).    [provider]  carbamazepine (TEGRETOL XR) 200 MG 12 hr tablet  Take 200 mg by mouth 2 (two) times daily.    [provider]  carbamazepine (TEGRETOL) 100 MG chewable tablet Chew 1 tablet (100 mg total) by mouth 2 (two) times a day. Patient not taking: Reported on 07/03/2020 11/25/18   Malvin Johns, MD  cephALEXin (KEFLEX) 500 MG capsule Take 1 capsule (500 mg total) by mouth 3 (three) times daily. 07/17/21   Tilden Fossa, MD  clonazePAM (KLONOPIN) 0.5 MG tablet Take 0.5 tablets (0.25 mg total) by mouth 2 (two) times daily. Patient not taking: Reported on 07/02/2022 11/25/18   Malvin Johns, MD  cyclobenzaprine (FLEXERIL) 10 MG tablet Take 10 mg by mouth 2 (two) times daily as needed for muscle spasms.    [provider]  cyclobenzaprine (FLEXERIL) 5 MG tablet Take 1 tablet (5 mg total) by mouth 3 (three) times daily. Patient not taking: Reported on 07/03/2020 11/25/18   Malvin Johns, MD  cyclobenzaprine (FLEXERIL) 5 MG tablet Take 1 tablet (5 mg total) by mouth 2 (two) times daily as needed for muscle spasms. 07/03/20   Tegeler, Canary Brim, MD  diphenhydramine-acetaminophen (TYLENOL PM) 25-500 MG TABS tablet Take 4 tablets by mouth at bedtime.    [provider]  DULoxetine (CYMBALTA) 30 MG capsule Take 90 mg by mouth daily.    [provider]  fluticasone (FLONASE) 50 MCG/ACT nasal spray Place 1 spray into both nostrils daily as needed for allergies. 07/01/22   [provider]  hydrochlorothiazide (HYDRODIURIL) 25 MG tablet Take 1 tablet (25 mg total) by mouth daily. Patient not taking: Reported on 07/03/2020 08/23/17  Money, Lowry Ram, FNP  HYDROcodone-acetaminophen (NORCO/VICODIN) 5-325 MG tablet Take 2 tablets by mouth every 4 (four) hours as needed. Patient not taking: Reported on 07/03/2020 01/20/19   Sharion Balloon, NP  lidocaine (LIDODERM) 5 % Place 1 patch onto the skin daily. Remove & Discard patch within 12 hours or as directed by MD Patient not taking: Reported on 07/02/2022 07/03/20   Tegeler, Gwenyth Allegra, MD   loratadine (CLARITIN) 10 MG tablet Take 10 mg by mouth daily as needed for allergies or rhinitis.    [provider]  metoprolol succinate (TOPROL-XL) 25 MG 24 hr tablet Take 25 mg by mouth daily. 05/25/20   [provider]  omeprazole (PRILOSEC) 40 MG capsule Take 40 mg by mouth daily before breakfast. 11/20/18   [provider]  oxyCODONE (ROXICODONE) 5 MG immediate release tablet Take 1 tablet (5 mg total) by mouth every 4 (four) hours as needed for severe pain. 02/18/21   Maudie Flakes, MD  perphenazine (TRILAFON) 4 MG tablet 1 in am 2 at h s Patient not taking: Reported on 07/03/2020 11/25/18   Johnn Hai, MD  prazosin (MINIPRESS) 2 MG capsule Take 2 capsules (4 mg total) by mouth at bedtime. Patient not taking: Reported on 07/03/2020 11/25/18   Johnn Hai, MD  prazosin (MINIPRESS) 5 MG capsule Take 5 mg by mouth at bedtime as needed (for night terrors).    [provider]  temazepam (RESTORIL) 30 MG capsule Take 1 capsule (30 mg total) by mouth at bedtime. Patient not taking: Reported on 07/03/2020 11/25/18   Johnn Hai, MD  TURMERIC PO Take 1 capsule by mouth daily with breakfast.    [provider]                                                                                                                                    Past Surgical History Past Surgical History:  Procedure Laterality Date   ABDOMINAL HYSTERECTOMY     COLONOSCOPY     UPPER GI ENDOSCOPY     Family History No family history on file.  Social History Social History   Tobacco Use   Smoking status: Former    Years: 12.00    Types: Cigarettes   Smokeless tobacco: Never  Vaping Use   Vaping Use: Never used  Substance Use Topics   Alcohol use: No   Drug use: Yes    Types: Marijuana    Comment: Last used: Today.    Allergies Haloperidol, Lisinopril, Other, Pregabalin, Risperidone and related, Gabapentin, Ibuprofen, Meloxicam, Naproxen, Penicillins, and  Tramadol  Review of Systems Review of Systems  Psychiatric/Behavioral:  Positive for dysphoric mood.     Physical Exam Vital Signs  I have reviewed the triage vital signs BP (!) 178/118 (BP Location: Left Arm)   Pulse (!) 121   Temp 99.2 F (37.3 C) (Oral)   Resp 15   SpO2 93%  Physical Exam Vitals and nursing note reviewed.  Constitutional:      General: She is not in acute distress.    Appearance: She is well-developed.  HENT:     Head: Normocephalic and atraumatic.  Eyes:     Conjunctiva/sclera: Conjunctivae normal.  Cardiovascular:     Heart sounds: No murmur heard. Pulmonary:     Effort: Pulmonary effort is normal. No respiratory distress.     Breath sounds: Normal breath sounds.  Abdominal:     Palpations: Abdomen is soft.     Tenderness: There is no abdominal tenderness.  Musculoskeletal:        General: No swelling.     Cervical back: Neck supple.  Skin:    General: Skin is warm and dry.     Capillary Refill: Capillary refill takes less than 2 seconds.  Neurological:     Mental Status: She is alert.  Psychiatric:        Mood and Affect: Mood normal.     ED Results and Treatments Labs (all labs ordered are listed, but only abnormal results are displayed) Labs Reviewed  RAPID URINE DRUG SCREEN, HOSP PERFORMED - Abnormal; Notable for the following components:      Result Value   Tetrahydrocannabinol POSITIVE (*)    All other components within normal limits  URINALYSIS, ROUTINE W REFLEX MICROSCOPIC - Abnormal; Notable for the following components:   Color, Urine AMBER (*)    APPearance HAZY (*)    Ketones, ur 80 (*)    Protein, ur 30 (*)    Leukocytes,Ua SMALL (*)    Bacteria, UA RARE (*)    All other components within normal limits  BASIC METABOLIC PANEL - Abnormal; Notable for the following components:   CO2 20 (*)    BUN 7 (*)    All other components within normal limits  ACETAMINOPHEN LEVEL - Abnormal; Notable for the following components:    Acetaminophen (Tylenol), Serum <10 (*)    All other components within normal limits  SALICYLATE LEVEL - Abnormal; Notable for the following components:   Salicylate Lvl <7.8 (*)    All other components within normal limits  RESP PANEL BY RT-PCR (RSV, FLU A&B, COVID)  RVPGX2  CBC WITH DIFFERENTIAL/PLATELET  ETHANOL                                                                                                                          Radiology No results found.  Pertinent labs & imaging results that were available during my care of the patient were reviewed by me and considered in my medical decision making (see MDM for details).  Medications Ordered in ED Medications  ARIPiprazole (ABILIFY) tablet 5 mg (5 mg Oral Given 07/18/22 1920)  Procedures Procedures  (including critical care time)  Medical Decision Making / ED Course   This patient presents to the ED for concern of depression, medication refill, this involves an extensive number of treatment options, and is a complaint that carries with it a high risk of complications and morbidity.  The differential diagnosis includes depression, bipolar disorder, psychosis, polysubstance use  MDM: Patient seen emergency room for evaluation of depression and medication refill.  Physical exam is largely unremarkable.  Laboratory evaluation unremarkable.  UDS positive for THC.  I gave the patient a dose of her Abilify and refilled her medications as requested.  She has already been evaluated by psychiatry earlier today and with no active suicidal ideation, homicidal ideation, auditory or visual hallucinations here in the emergency department, she does not meet criteria for emergency psychiatric reevaluation or inpatient hospitalization.  Patient then discharged with outpatient follow-up and she was instructed to  return to [facility].   Additional history obtained:  -External records from outside source obtained and reviewed including: Chart review including previous notes, labs, imaging, consultation notes   Lab Tests: -I ordered, reviewed, and interpreted labs.   The pertinent results include:   Labs Reviewed  RAPID URINE DRUG SCREEN, HOSP PERFORMED - Abnormal; Notable for the following components:      Result Value   Tetrahydrocannabinol POSITIVE (*)    All other components within normal limits  URINALYSIS, ROUTINE W REFLEX MICROSCOPIC - Abnormal; Notable for the following components:   Color, Urine AMBER (*)    APPearance HAZY (*)    Ketones, ur 80 (*)    Protein, ur 30 (*)    Leukocytes,Ua SMALL (*)    Bacteria, UA RARE (*)    All other components within normal limits  BASIC METABOLIC PANEL - Abnormal; Notable for the following components:   CO2 20 (*)    BUN 7 (*)    All other components within normal limits  ACETAMINOPHEN LEVEL - Abnormal; Notable for the following components:   Acetaminophen (Tylenol), Serum <10 (*)    All other components within normal limits  SALICYLATE LEVEL - Abnormal; Notable for the following components:   Salicylate Lvl <9.3 (*)    All other components within normal limits  RESP PANEL BY RT-PCR (RSV, FLU A&B, COVID)  RVPGX2  CBC WITH DIFFERENTIAL/PLATELET  ETHANOL      EKG   EKG Interpretation  Date/Time:  Thursday July 18 2022 15:47:39 EST Ventricular Rate:  131 PR Interval:  118 QRS Duration: 64 QT Interval:  292 QTC Calculation: 431 R Axis:   30 Text Interpretation: Sinus tachycardia Right atrial enlargement ST & T wave abnormality, consider lateral ischemia Abnormal ECG When compared with ECG of 29-Jun-2022 16:04, PREVIOUS ECG IS PRESENT Confirmed by Lacretia Leigh ([id number]) on 07/19/2022 7:37:21 AM        Medicines ordered and prescription drug management: Meds ordered this encounter  Medications   ARIPiprazole (ABILIFY) tablet 5 mg    ARIPiprazole (ABILIFY) 5 MG tablet    Sig: Take 1 tablet (5 mg total) by mouth in the morning and at bedtime.    Dispense:  60 tablet    Refill:  0    -I have reviewed the patients home medicines and have made adjustments as needed  Critical interventions none    Social Determinants of Health:  Factors impacting patients care include: left [facility] this morning   Reevaluation: After the interventions noted above, I reevaluated the patient and found that they have :stayed the  same  Co morbidities that complicate the patient evaluation  Past Medical History:  Diagnosis Date   Barrett's esophagus    DDD (degenerative disc disease), lumbar    Fibromyalgia    Gastric ulcer    Hypertension    Migraines       Dispostion: I considered admission for this patient, but she does not meet inpatient criteria for admission she is safe for discharge the patient follow-up     Final Clinical Impression(s) / ED Diagnoses Final diagnoses:  Depression, unspecified depression type  Medication refill     @PCDICTATION @    Carrel Leather, , MD 07/19/22 [phone]

## 2022-08-28 ENCOUNTER — Ambulatory Visit (INDEPENDENT_AMBULATORY_CARE_PROVIDER_SITE_OTHER): Payer: Medicare HMO

## 2022-08-28 ENCOUNTER — Ambulatory Visit (INDEPENDENT_AMBULATORY_CARE_PROVIDER_SITE_OTHER): Payer: Medicare HMO | Admitting: Student

## 2022-08-28 ENCOUNTER — Encounter: Payer: Self-pay | Admitting: Student

## 2022-08-28 VITALS — BP 136/88 | HR 97 | Temp 97.8°F | Ht 67.0 in | Wt 232.6 lb

## 2022-08-28 DIAGNOSIS — R0609 Other forms of dyspnea: Secondary | ICD-10-CM

## 2022-08-28 DIAGNOSIS — R911 Solitary pulmonary nodule: Secondary | ICD-10-CM

## 2022-08-28 NOTE — Patient Instructions (Signed)
-   I'll let you know if there's anything out of the ordinary on chest x ray when finalized by radiologist - PFT (breathing tests) in 3 months on same day as next appointment unless you're feeling way better (in which case feel free to cancel)

## 2022-08-28 NOTE — Progress Notes (Signed)
Synopsis: Referred for pulmonary nodule by Armanda Heritage, NP  Subjective:   PATIENT ID: Pamela Graham GENDER: female DOB: December 22, 1955, MRN: [id number]  Chief Complaint  Patient presents with   Consult   66yF with 10 py smoking, barrett's esophagus, GERD, FM referred for possible LLL nodule seen on CXR 07/23/22  She had ED visit for a fall in 04/2022 found to have broken L 6th-8th ribs, posterior 8th-9th ribs   Had follow up CXR for fever found to have LLL ?nodule.  She has no cough other than what she attributes to her GERD. She has minor DOE with sensation of heart racing when she exercises.  Otherwise pertinent review of systems is negative.  Mother with sarcoid  Smoked 10 py quit  Past Medical History:  Diagnosis Date   Barrett's esophagus    DDD (degenerative disc disease), lumbar    Fibromyalgia    Gastric ulcer    Hypertension    Migraines      No family history on file.   Past Surgical History:  Procedure Laterality Date   ABDOMINAL HYSTERECTOMY     COLONOSCOPY     UPPER GI ENDOSCOPY      Social History   Socioeconomic History   Marital status: Single    Spouse name: Not on file   Number of children: Not on file   Years of education: Not on file   Highest education level: Not on file  Occupational History   Not on file  Tobacco Use   Smoking status: Former    Years: 12.00    Types: Cigarettes   Smokeless tobacco: Never  Vaping Use   Vaping Use: Never used  Substance and Sexual Activity   Alcohol use: No   Drug use: Yes    Types: Marijuana    Comment: Last used: Today.    Sexual activity: Not Currently  Other Topics Concern   Not on file  Social History Narrative   Not on file   Social Determinants of Health   Financial Resource Strain: Not on file  Food Insecurity: Not on file  Transportation Needs: Not on file  Physical Activity: Not on file  Stress: Not on file  Social Connections: Not on file  Intimate Partner Violence: Not on  file     Allergies  Allergen Reactions   Haloperidol Shortness Of Breath, Itching and Rash   Lisinopril Shortness Of Breath   Other Other (See Comments)    Steroids- Patient stated she cannot have these because of 3rd stage kidney disease   Pregabalin Other (See Comments), Palpitations and Rash    Tachycardia   Risperidone And Related Shortness Of Breath, Itching and Rash   Gabapentin Other (See Comments) and Swelling    Gastrointestinal upset (Barrett's Esophagus irritation)  Other Reaction(s): GI Intolerance   Ibuprofen Rash   Meloxicam Rash and Other (See Comments)    Renal insufficiency    Naproxen Other (See Comments)    GI Intolerance/Stomach pain   Penicillins Hives and Swelling    Has patient had a PCN reaction causing immediate rash, facial/tongue/throat swelling, SOB or lightheadedness with hypotension: Yes Has patient had a PCN reaction causing severe rash involving mucus membranes or skin necrosis: No Has patient had a PCN reaction that required hospitalization: No Has patient had a PCN reaction occurring within the last 10 years: No If all of the above answers are "NO", then may proceed with Cephalosporin use.    Tramadol Rash     Outpatient  Medications Prior to Visit  Medication Sig Dispense Refill   acetaminophen (TYLENOL) 500 MG tablet Take 1,000 mg by mouth every 4 (four) hours as needed (for fibromyalgia-related pain).     carbamazepine (TEGRETOL XR) 200 MG 12 hr tablet Take 200 mg by mouth 2 (two) times daily.     carbamazepine (TEGRETOL) 100 MG chewable tablet Chew 1 tablet (100 mg total) by mouth 2 (two) times a day. 60 tablet 2   cephALEXin (KEFLEX) 500 MG capsule Take 1 capsule (500 mg total) by mouth 3 (three) times daily. 21 capsule 0   cyclobenzaprine (FLEXERIL) 10 MG tablet Take 10 mg by mouth 2 (two) times daily as needed for muscle spasms.     cyclobenzaprine (FLEXERIL) 5 MG tablet Take 1 tablet (5 mg total) by mouth 2 (two) times daily as needed  for muscle spasms. 20 tablet 0   diphenhydramine-acetaminophen (TYLENOL PM) 25-500 MG TABS tablet Take 4 tablets by mouth at bedtime.     DULoxetine (CYMBALTA) 30 MG capsule Take 90 mg by mouth daily.     fluticasone (FLONASE) 50 MCG/ACT nasal spray Place 1 spray into both nostrils daily as needed for allergies.     HYDROcodone-acetaminophen (NORCO/VICODIN) 5-325 MG tablet Take 2 tablets by mouth every 4 (four) hours as needed. 6 tablet 0   lidocaine (LIDODERM) 5 % Place 1 patch onto the skin daily. Remove & Discard patch within 12 hours or as directed by MD 15 patch 0   loratadine (CLARITIN) 10 MG tablet Take 10 mg by mouth daily as needed for allergies or rhinitis.     metoprolol succinate (TOPROL-XL) 25 MG 24 hr tablet Take 25 mg by mouth daily.     omeprazole (PRILOSEC) 40 MG capsule Take 40 mg by mouth daily before breakfast.     prazosin (MINIPRESS) 5 MG capsule Take 5 mg by mouth at bedtime as needed (for night terrors).     TURMERIC PO Take 1 capsule by mouth daily with breakfast.     ARIPiprazole (ABILIFY) 5 MG tablet Take 1 tablet (5 mg total) by mouth in the morning and at bedtime. 60 tablet 0   hydrochlorothiazide (HYDRODIURIL) 25 MG tablet Take 1 tablet (25 mg total) by mouth daily. (Patient not taking: Reported on 07/03/2020) 30 tablet 0   oxyCODONE (ROXICODONE) 5 MG immediate release tablet Take 1 tablet (5 mg total) by mouth every 4 (four) hours as needed for severe pain. (Patient not taking: Reported on 08/28/2022) 6 tablet 0   perphenazine (TRILAFON) 4 MG tablet 1 in am 2 at h s (Patient not taking: Reported on 07/03/2020) 90 tablet 2   prazosin (MINIPRESS) 2 MG capsule Take 2 capsules (4 mg total) by mouth at bedtime. (Patient not taking: Reported on 07/03/2020) 30 capsule 2   temazepam (RESTORIL) 30 MG capsule Take 1 capsule (30 mg total) by mouth at bedtime. (Patient not taking: Reported on 07/03/2020) 30 capsule 0   clonazePAM (KLONOPIN) 0.5 MG tablet Take 0.5 tablets (0.25 mg  total) by mouth 2 (two) times daily. (Patient not taking: Reported on 07/02/2022) 60 tablet 0   cyclobenzaprine (FLEXERIL) 5 MG tablet Take 1 tablet (5 mg total) by mouth 3 (three) times daily. (Patient not taking: Reported on 07/03/2020) 90 tablet 2   No facility-administered medications prior to visit.       Objective:   Physical Exam:  General appearance: 67 y.o., female, NAD, conversant  Eyes: anicteric sclerae; PERRL, tracking appropriately HENT: NCAT; MMM Neck: Trachea midline;  no lymphadenopathy, no JVD Lungs: CTAB, no crackles, no wheeze, with normal respiratory effort CV: RRR, no murmur  Abdomen: Soft, non-tender; non-distended, BS present  Extremities: No peripheral edema, warm Skin: Normal turgor and texture; no rash Psych: Appropriate affect Neuro: Alert and oriented to person and place, no focal deficit    Bedside US without obvious effusion  Vitals:   08/28/22 1600  BP: 136/88  Pulse: 97  Temp: 97.8 F (36.6 C)  TempSrc: Oral  SpO2: 100%  Weight: 232 lb 9.6 oz (105.5 kg)  Height: '5\' 7"'$  (1.702 m)   100% on RA BMI Readings from Last 3 Encounters:  08/28/22 36.43 kg/m  06/29/22 34.77 kg/m  10/29/21 34.77 kg/m   Wt Readings from Last 3 Encounters:  08/28/22 232 lb 9.6 oz (105.5 kg)  06/29/22 222 lb 0.1 oz (100.7 kg)  10/29/21 222 lb (100.7 kg)     CBC    Component Value Date/Time   WBC 9.5 07/18/2022 1530   RBC 4.73 07/18/2022 1530   HGB 14.9 07/18/2022 1530   HCT 45.5 07/18/2022 1530   PLT 176 07/18/2022 1530   MCV 96.2 07/18/2022 1530   MCH 31.5 07/18/2022 1530   MCHC 32.7 07/18/2022 1530   RDW 13.3 07/18/2022 1530   LYMPHSABS 2.3 07/18/2022 1530   MONOABS 0.8 07/18/2022 1530   EOSABS 0.0 07/18/2022 1530   BASOSABS 0.1 07/18/2022 1530     Chest Imaging: CXR with platelike atelectasis LLL awaiting final read  Pulmonary Functions Testing Results:     No data to display              Assessment & Plan:   # DOE Minor issue  to her.  # Abnormal x ray Questionable atelectasis LLL related to her prior rib fractures  Plan: - PFT next visit     Maryjane Hurter, MD [facility] 08/28/2022 4:28 PM

## 2024-02-25 ENCOUNTER — Emergency Department (HOSPITAL_COMMUNITY)
Admission: EM | Admit: 2024-02-25 | Discharge: 2024-02-25 | Disposition: A | Discharge status: Home / Self Care | Attending: Emergency Medicine | Admitting: Emergency Medicine

## 2024-02-25 ENCOUNTER — Other Ambulatory Visit: Payer: Self-pay

## 2024-02-25 ENCOUNTER — Emergency Department (HOSPITAL_COMMUNITY)

## 2024-02-25 DIAGNOSIS — I10 Essential (primary) hypertension: Secondary | ICD-10-CM | POA: Diagnosis not present

## 2024-02-25 DIAGNOSIS — K5732 Diverticulitis of large intestine without perforation or abscess without bleeding: Secondary | ICD-10-CM | POA: Insufficient documentation

## 2024-02-25 DIAGNOSIS — Z79899 Other long term (current) drug therapy: Secondary | ICD-10-CM | POA: Insufficient documentation

## 2024-02-25 DIAGNOSIS — K5792 Diverticulitis of intestine, part unspecified, without perforation or abscess without bleeding: Secondary | ICD-10-CM

## 2024-02-25 DIAGNOSIS — K5900 Constipation, unspecified: Secondary | ICD-10-CM | POA: Diagnosis not present

## 2024-02-25 DIAGNOSIS — R1084 Generalized abdominal pain: Secondary | ICD-10-CM | POA: Diagnosis present

## 2024-02-25 LAB — CBC
HCT: 44.1 % (ref 36.0–46.0)
Hemoglobin: 14.5 g/dL (ref 12.0–15.0)
MCH: 31.5 pg (ref 26.0–34.0)
MCHC: 32.9 g/dL (ref 30.0–36.0)
MCV: 95.9 fL (ref 80.0–100.0)
Platelets: 149 K/uL — ABNORMAL LOW (ref 150–400)
RBC: 4.6 MIL/uL (ref 3.87–5.11)
RDW: 13.6 % (ref 11.5–15.5)
WBC: 7.1 K/uL (ref 4.0–10.5)
nRBC: 0 % (ref 0.0–0.2)

## 2024-02-25 LAB — COMPREHENSIVE METABOLIC PANEL WITH GFR
ALT: 14 U/L (ref 0–44)
AST: 18 U/L (ref 15–41)
Albumin: 3.9 g/dL (ref 3.5–5.0)
Alkaline Phosphatase: 75 U/L (ref 38–126)
Anion gap: 11 (ref 5–15)
BUN: 7 mg/dL — ABNORMAL LOW (ref 8–23)
CO2: 22 mmol/L (ref 22–32)
Calcium: 9.2 mg/dL (ref 8.9–10.3)
Chloride: 106 mmol/L (ref 98–111)
Creatinine, Ser: 1.01 mg/dL — ABNORMAL HIGH (ref 0.44–1.00)
GFR, Estimated: >60 mL/min (ref >60)
Glucose, Bld: 103 mg/dL — ABNORMAL HIGH (ref 70–99)
Potassium: 3.5 mmol/L (ref 3.5–5.1)
Sodium: 139 mmol/L (ref 135–145)
Total Bilirubin: 0.8 mg/dL (ref 0.0–1.2)
Total Protein: 7.3 g/dL (ref 6.5–8.1)

## 2024-02-25 LAB — URINALYSIS, ROUTINE W REFLEX MICROSCOPIC
Bilirubin Urine: NEGATIVE
Glucose, UA: NEGATIVE mg/dL
Hgb urine dipstick: NEGATIVE
Ketones, ur: 5 mg/dL — AB
Leukocytes,Ua: NEGATIVE
Nitrite: NEGATIVE
Protein, ur: 30 mg/dL — AB
Specific Gravity, Urine: 1.025 (ref 1.005–1.030)
pH: 5 (ref 5.0–8.0)

## 2024-02-25 LAB — LIPASE, BLOOD: Lipase: 16 U/L (ref 11–51)

## 2024-02-25 MED ORDER — ONDANSETRON 4 MG PO TBDP: 4.0000 mg | ORAL_TABLET | Freq: Three times a day (TID) | ORAL | 0 refills | Status: DC | PRN

## 2024-02-25 MED ORDER — MORPHINE SULFATE (PF) 4 MG/ML IV SOLN
4.0000 mg | Freq: Once | INTRAVENOUS | Status: AC
  Administered 2024-02-25: 4 mg via INTRAVENOUS
  Filled 2024-02-25: qty 1

## 2024-02-25 MED ORDER — ONDANSETRON HCL 4 MG/2ML IJ SOLN
4.0000 mg | Freq: Once | INTRAMUSCULAR | Status: AC
  Administered 2024-02-25: 4 mg via INTRAVENOUS
  Filled 2024-02-25: qty 2

## 2024-02-25 MED ORDER — SMOG ENEMA
960.0000 mL | Freq: Once | RECTAL | Status: AC
  Administered 2024-02-25: 960 mL via RECTAL
  Filled 2024-02-25: qty 960

## 2024-02-25 MED ORDER — METRONIDAZOLE 500 MG PO TABS: 500.0000 mg | ORAL_TABLET | Freq: Two times a day (BID) | ORAL | 0 refills | Status: DC

## 2024-02-25 MED ORDER — LACTULOSE 10 GM/15ML PO SOLN
20.0000 g | Freq: Once | ORAL | Status: AC
  Administered 2024-02-25: 20 g via ORAL
  Filled 2024-02-25 (×2): qty 30

## 2024-02-25 MED ORDER — METRONIDAZOLE 500 MG/100ML IV SOLN: 500.0000 mg | Freq: Once | INTRAVENOUS | Status: DC

## 2024-02-25 MED ORDER — IOHEXOL 350 MG/ML SOLN
75.0000 mL | Freq: Once | INTRAVENOUS | Status: AC | PRN
  Administered 2024-02-25: 75 mL via INTRAVENOUS

## 2024-02-25 MED ORDER — SODIUM CHLORIDE 0.9 % IV SOLN
2.0000 g | Freq: Once | INTRAVENOUS | Status: AC
  Administered 2024-02-25: 2 g via INTRAVENOUS
  Filled 2024-02-25: qty 20

## 2024-02-25 MED ORDER — SODIUM CHLORIDE 0.9 % IV BOLUS
1000.0000 mL | Freq: Once | INTRAVENOUS | Status: AC
  Administered 2024-02-25: 1000 mL via INTRAVENOUS

## 2024-02-25 MED ORDER — SODIUM CHLORIDE 0.9 % IV SOLN: 1.0000 g | Freq: Once | INTRAVENOUS | Status: DC

## 2024-02-25 MED ORDER — CIPROFLOXACIN HCL 500 MG PO TABS: 500.0000 mg | ORAL_TABLET | Freq: Two times a day (BID) | ORAL | 0 refills | Status: DC

## 2024-02-25 MED ORDER — METRONIDAZOLE 500 MG/100ML IV SOLN
500.0000 mg | Freq: Once | INTRAVENOUS | Status: AC
  Administered 2024-02-25: 500 mg via INTRAVENOUS
  Filled 2024-02-25: qty 100

## 2024-02-25 MED ORDER — LACTULOSE 20 GM/30ML PO SOLN: 20.0000 g | Freq: Every day | ORAL | 0 refills | Status: AC | PRN

## 2024-02-25 MED ORDER — HYDROCODONE-ACETAMINOPHEN 5-325 MG PO TABS: 1.0000 | ORAL_TABLET | ORAL | 0 refills | Status: DC | PRN

## 2024-02-25 MED ORDER — CIPROFLOXACIN IN D5W 400 MG/200ML IV SOLN: 400.0000 mg | Freq: Once | INTRAVENOUS | Status: DC

## 2024-02-25 NOTE — ED Triage Notes (Signed)
 Quick triage note- Pt endorses generalized abd pain since Thursday. Pt had colonoscopy in August. Decreased appetite and reports less frequent BMs.

## 2024-02-25 NOTE — ED Notes (Signed)
 Pt called for vitals no response. CT also came for pt x2 no response.

## 2024-02-25 NOTE — ED Provider Notes (Signed)
 [place] [facility]PARTMENT AT [facility] Provider Note   CSN: [id number] Arrival date & time: 02/25/24  9056     Patient presents with: Abdominal Pain and Nausea   Pamela Graham is a 68 y.o. female.   Pt is a 68 yo female with pmhx significant for fibromyalgia, htn, bipolar d/o, barrett's espohagus, migraines, and ddd.  Pt had a colonoscopy on 7/24 and does not feel like she's been right since then.  She has been having increased abd pain with n/v since Thursday, 9/4.  She feels constipated and has taken laxatives without improvement.  She's not been able to keep anything down for 3 days.    Colonoscopy 01/08/24 - One 15 mm polyp in the sigmoid colon, removed. TA on biopsy, negative for high grade dysplasia. Repeat colonoscopy in 3 years.  EGD 01/06/24 - No endoscopic esophageal abnormality to explain patient' s dysphagia. Esophagus dilated. Dilated. Chronic gastritis, characterized by erythema and granularity. Normal examined duodenum. Pathology showed active chronic H. Pylori associated gastritis, positive for H.pylori.         Prior to Admission medications   Medication Sig Start Date End Date Taking? Authorizing Provider  ciprofloxacin  (CIPRO ) 500 MG tablet Take 1 tablet (500 mg total) by mouth 2 (two) times daily. 02/25/24  Yes Dean Clarity, MD  HYDROcodone -acetaminophen  (NORCO/VICODIN) 5-325 MG tablet Take 1 tablet by mouth every 4 (four) hours as needed. 02/25/24  Yes Dean Clarity, MD  Lactulose  20 GM/30ML SOLN Take 30 mLs (20 g total) by mouth daily as needed (constipation). 02/25/24  Yes Dean Clarity, MD  metroNIDAZOLE  (FLAGYL ) 500 MG tablet Take 1 tablet (500 mg total) by mouth 2 (two) times daily. 02/25/24  Yes Dean Clarity, MD  ondansetron  (ZOFRAN -ODT) 4 MG disintegrating tablet Take 1 tablet (4 mg total) by mouth every 8 (eight) hours as needed. 02/25/24  Yes Dean Clarity, MD  acetaminophen  (TYLENOL ) 500 MG tablet Take 1,000 mg by mouth every 4  (four) hours as needed (for fibromyalgia-related pain).    [provider]  ARIPiprazole  (ABILIFY ) 5 MG tablet Take 1 tablet (5 mg total) by mouth in the morning and at bedtime. 07/18/22 08/17/22  Kommor, [place], MD  carbamazepine  (TEGRETOL  XR) 200 MG 12 hr tablet Take 200 mg by mouth 2 (two) times daily.    [provider]  carbamazepine  (TEGRETOL ) 100 MG chewable tablet Chew 1 tablet (100 mg total) by mouth 2 (two) times a day. 11/25/18   Malinda Rogue, MD  cephALEXin  (KEFLEX ) 500 MG capsule Take 1 capsule (500 mg total) by mouth 3 (three) times daily. 07/17/21   Griselda Norris, MD  cyclobenzaprine  (FLEXERIL ) 10 MG tablet Take 10 mg by mouth 2 (two) times daily as needed for muscle spasms.    [provider]  cyclobenzaprine  (FLEXERIL ) 5 MG tablet Take 1 tablet (5 mg total) by mouth 2 (two) times daily as needed for muscle spasms. 07/03/20   Tegeler, Lonni PARAS, MD  diphenhydramine -acetaminophen  (TYLENOL  PM) 25-500 MG TABS tablet Take 4 tablets by mouth at bedtime.    [provider]  DULoxetine  (CYMBALTA ) 30 MG capsule Take 90 mg by mouth daily.    [provider]  fluticasone (FLONASE) 50 MCG/ACT nasal spray Place 1 spray into both nostrils daily as needed for allergies. 07/01/22   [provider]  hydrochlorothiazide  (HYDRODIURIL ) 25 MG tablet Take 1 tablet (25 mg total) by mouth daily. Patient not taking: Reported on 07/03/2020 08/23/17   Money, Caron NOVAK, FNP  lidocaine  (LIDODERM )  5 % Place 1 patch onto the skin daily. Remove & Discard patch within 12 hours or as directed by MD 07/03/20   Tegeler, Lonni PARAS, MD  loratadine (CLARITIN) 10 MG tablet Take 10 mg by mouth daily as needed for allergies or rhinitis.    [provider]  metoprolol  succinate (TOPROL -XL) 25 MG 24 hr tablet Take 25 mg by mouth daily. 05/25/20   [provider]  omeprazole (PRILOSEC) 40 MG capsule Take 40 mg by mouth daily before breakfast. 11/20/18   [provider]  oxyCODONE  (ROXICODONE ) 5 MG immediate release tablet Take 1 tablet (5 mg total) by mouth every 4 (four) hours as needed for severe pain. Patient not taking: Reported on 08/28/2022 02/18/21   Theadore Ozell HERO, MD  perphenazine  (TRILAFON ) 4 MG tablet 1 in am 2 at h s Patient not taking: Reported on 07/03/2020 11/25/18   Malinda Rogue, MD  prazosin  (MINIPRESS ) 2 MG capsule Take 2 capsules (4 mg total) by mouth at bedtime. Patient not taking: Reported on 07/03/2020 11/25/18   Malinda Rogue, MD  prazosin  (MINIPRESS ) 5 MG capsule Take 5 mg by mouth at bedtime as needed (for night terrors).    [provider]  temazepam  (RESTORIL ) 30 MG capsule Take 1 capsule (30 mg total) by mouth at bedtime. Patient not taking: Reported on 07/03/2020 11/25/18   Malinda Rogue, MD  TURMERIC PO Take 1 capsule by mouth daily with breakfast.    [provider]    Allergies: Haloperidol, Lisinopril, Other, Pregabalin, Risperidone and paliperidone, Gabapentin, Ibuprofen, Meloxicam, Naproxen , Penicillins, and Tramadol    Review of Systems  Gastrointestinal:  Positive for abdominal pain, nausea and vomiting.  All other systems reviewed and are negative.   Updated Vital Signs BP (!) 148/93   Pulse 69   Temp 98.1 F (36.7 C) (Oral)   Resp 16   Ht 5' 7 (1.702 m)   Wt 97.5 kg   SpO2 100%   BMI 33.67 kg/m   Physical Exam Vitals and nursing note reviewed.  Constitutional:      Appearance: She is well-developed.  HENT:     Head: Normocephalic and atraumatic.     Mouth/Throat:     Mouth: Mucous membranes are dry.     Pharynx: Oropharynx is clear.  Eyes:     Extraocular Movements: Extraocular movements intact.     Pupils: Pupils are equal, round, and reactive to light.  Cardiovascular:     Rate and Rhythm: Normal rate and regular rhythm.     Heart sounds: Normal heart sounds.  Pulmonary:     Effort: Pulmonary effort is normal.     Breath sounds: Normal breath sounds.  Abdominal:      General: Abdomen is flat. Bowel sounds are normal.     Palpations: Abdomen is soft.     Tenderness: There is generalized abdominal tenderness.  Skin:    General: Skin is warm.     Capillary Refill: Capillary refill takes less than 2 seconds.  Neurological:     General: No focal deficit present.     Mental Status: She is alert and oriented to person, place, and time.  Psychiatric:        Mood and Affect: Mood normal.        Behavior: Behavior normal.     (all labs ordered are listed, but only abnormal results are displayed) Labs Reviewed  COMPREHENSIVE METABOLIC PANEL WITH GFR - Abnormal; Notable for the following components:  Result Value   Glucose, Bld 103 (*)    BUN 7 (*)    Creatinine, Ser 1.01 (*)    All other components within normal limits  CBC - Abnormal; Notable for the following components:   Platelets 149 (*)    All other components within normal limits  URINALYSIS, ROUTINE W REFLEX MICROSCOPIC - Abnormal; Notable for the following components:   Color, Urine AMBER (*)    APPearance HAZY (*)    Ketones, ur 5 (*)    Protein, ur 30 (*)    Bacteria, UA RARE (*)    All other components within normal limits  LIPASE, BLOOD    EKG: None  Radiology: CT ABDOMEN PELVIS W CONTRAST Result Date: 02/25/2024 CLINICAL DATA:  Generalized abdominal pain since Thursday, decreased appetite EXAM: CT ABDOMEN AND PELVIS WITH CONTRAST TECHNIQUE: Multidetector CT imaging of the abdomen and pelvis was performed using the standard protocol following bolus administration of intravenous contrast. RADIATION DOSE REDUCTION: This exam was performed according to the departmental dose-optimization program which includes automated exposure control, adjustment of the mA and/or kV according to patient size and/or use of iterative reconstruction technique. CONTRAST:  75mL OMNIPAQUE  IOHEXOL  350 MG/ML SOLN COMPARISON:  07/03/2020 FINDINGS: Lower chest: Linear subpleural scarring within the lingular  segment of the left upper lobe. No acute pleural or parenchymal lung disease. Hepatobiliary: Mild decreased attenuation of the liver parenchyma consistent with hepatic steatosis. No focal liver abnormality. The gallbladder is unremarkable. No biliary duct dilation. Pancreas: Unremarkable. No pancreatic ductal dilatation or surrounding inflammatory changes. Spleen: Normal in size without focal abnormality. Adrenals/Urinary Tract: Adrenal glands are unremarkable. Kidneys are normal, without renal calculi, focal lesion, or hydronephrosis. Bladder is decompressed, limiting its evaluation. Stomach/Bowel: No evidence of bowel obstruction or ileus. There is moderate mural thickening throughout the sigmoid colon, which may reflect colitis or diverticulitis. The large amount of retained stool is seen within the more proximal colon, consistent with constipation. Normal appendix right lower quadrant. Small hiatal hernia. Vascular/Lymphatic: Aortic atherosclerosis. No enlarged abdominal or pelvic lymph nodes. Reproductive: Status post hysterectomy. No adnexal masses. Other: No free fluid or free intraperitoneal gas. No abdominal wall hernia. Musculoskeletal: No acute or destructive bony abnormalities. Reconstructed images demonstrate no additional findings. IMPRESSION: 1. Long segment sigmoid colonic wall thickening, compatible with sigmoid diverticulitis or colitis. No perforation, fluid collection, or abscess. 2. Marked fecal retention within the more proximal colon, consistent with constipation. No bowel obstruction or ileus. 3. Hepatic steatosis. 4. Small hiatal hernia. 5.  Aortic Atherosclerosis (ICD10-I70.0). Electronically Signed   By: Ozell Daring M.D.   On: 02/25/2024 16:45     Procedures   Medications Ordered in the ED  metroNIDAZOLE  (FLAGYL ) IVPB 500 mg (has no administration in time range)  lactulose  (CHRONULAC ) 10 GM/15ML solution 20 g (has no administration in time range)  iohexol  (OMNIPAQUE ) 350 MG/ML  injection 75 mL (75 mLs Intravenous Contrast Given 02/25/24 1636)  morphine  (PF) 4 MG/ML injection 4 mg (4 mg Intravenous Given 02/25/24 1835)  sodium chloride  0.9 % bolus 1,000 mL (1,000 mLs Intravenous New Bag/Given 02/25/24 2006)  ondansetron  (ZOFRAN ) injection 4 mg (4 mg Intravenous Given 02/25/24 1834)  sorbitol , magnesium  hydroxide, mineral oil, glycerin (SMOG) enema (960 mLs Rectal Given 02/25/24 1848)  cefTRIAXone  (ROCEPHIN ) 2 g in sodium chloride  0.9 % 100 mL IVPB (2 g Intravenous New Bag/Given 02/25/24 2004)  morphine  (PF) 4 MG/ML injection 4 mg (4 mg Intravenous Given 02/25/24 2015)  Medical Decision Making Amount and/or Complexity of Data Reviewed Labs: ordered. Radiology: ordered.  Risk Prescription drug management.   This patient presents to the ED for concern of abd pain, this involves an extensive number of treatment options, and is a complaint that carries with it a high risk of complications and morbidity.  The differential diagnosis includes constipation, sbo, impaction, diverticulitis, colitis, uti   Co morbidities that complicate the patient evaluation   fibromyalgia, htn, bipolar d/o, barrett's espohagus, migraines, and ddd   Additional history obtained:  Additional history obtained from epic chart review  Lab Tests:  I Ordered, and personally interpreted labs.  The pertinent results include:  cbc nl, cmp nl, ua + ketones   Imaging Studies ordered:  I ordered imaging studies including ct abd/pelvis  I independently visualized and interpreted imaging which showed   Long segment sigmoid colonic wall thickening, compatible with  sigmoid diverticulitis or colitis. No perforation, fluid collection,  or abscess.  2. Marked fecal retention within the more proximal colon, consistent  with constipation. No bowel obstruction or ileus.  3. Hepatic steatosis.  4. Small hiatal hernia.  5.  Aortic Atherosclerosis (ICD10-I70.0).   I  agree with the radiologist interpretation  Medicines ordered and prescription drug management:  I ordered medication including smog and lactulose   for constipation and rocephin /flagyl  for diverticulitis  Reevaluation of the patient after these medicines showed that the patient improved I have reviewed the patients home medicines and have made adjustments as needed   Test Considered:  ct   Problem List / ED Course:  Diverticulitis:  pt given rocephin /flagyl  in ED.  She is d/c with cipro /flagyl  as she has an allergy to pcn. Constipation:  no bm with smog enema, but stool is in the proximal colon.  She's been taking miralax at home, so I am d/c her with lactulose .  She is to eat a high fiber diet.     Reevaluation:  After the interventions noted above, I reevaluated the patient and found that they have :improved   Social Determinants of Health:  Lives at home   Dispostion:  After consideration of the diagnostic results and the patients response to treatment, I feel that the patent would benefit from discharge with outpatient f/u.       Final diagnoses:  Constipation, unspecified constipation type  Diverticulitis    ED Discharge Orders          Ordered    ciprofloxacin  (CIPRO ) 500 MG tablet  2 times daily        02/25/24 1955    metroNIDAZOLE  (FLAGYL ) 500 MG tablet  2 times daily        02/25/24 1955    ondansetron  (ZOFRAN -ODT) 4 MG disintegrating tablet  Every 8 hours PRN        02/25/24 1955    Lactulose  20 GM/30ML SOLN  Daily PRN        02/25/24 2042    HYDROcodone -acetaminophen  (NORCO/VICODIN) 5-325 MG tablet  Every 4 hours PRN        02/25/24 2044               Dean Clarity, MD 02/25/24 2045

## 2024-02-25 NOTE — ED Triage Notes (Signed)
 Pt. Stated, I had a colonoscopy 3 weeks ago and my stomach has not been right since. I called the Dr. And he said come here.

## 2024-02-27 NOTE — Progress Notes (Signed)
 Patient ID: Pamela Graham is a 68 y.o. (DOB 1955/08/02) female  Primary Care Provider : Elveria Tedi Kaiser, MD Primary Gastroenterologist:Chris Latisha, MD    Assessment:  1. Abnormal CT scan, gastrointestinal tract   2. H. pylori infection   3. Constipation, unspecified constipation type   4. Hospital discharge follow-up   5. Diverticulitis      Discussion/Plan: Abnormal CT imaging--found to have sigmoid colon wall thickening suggestive of diverticulitis versus colitis.  She is currently on day 2 of Cipro  and Flagyl  and having trouble taking this regimen.  I encouraged her to finish antibiotic course but if she could not completely get through taking the Flagyl , to at least finish course with ciprofloxacin .  She will call me in a week to let me know how she is doing so that I can determine if she needs an extension as she was only prescribed 7 days worth of antibiotics.  I recommended she reduce to clear liquid diet for the next 2 to 3 days and gradually advance to full liquids followed by GI soft diet.  Constipation--found to have moderate right sided fecal retention which is not her baseline. She reports no BM since in over a week.  Recommended MiraLAX  but if not helpful she could use the lactulose  prescribed to her from the emergency room.  H. Pylori infection--s/p quadruple therapy. Aware she will need to wait 4 weeks after completing antibiotics for her colitis/diverticulitis before she can complete breath testing. She knows she has to hold omeprazole for 2 weeks prior to testing    No orders of the defined types were placed in this encounter.   Risks, benefits, and alternatives of the medications and treatment plan prescribed today were discussed, and patient expressed understanding. Plan follow-up as discussed or as needed if any worsening symptoms or change in condition.  Please excuse any typos as this was dictated with a voice recognition device  Chief  Complaint:  Abdominal pain and constipation   HPI:  Pamela Graham is a 68 y.o. female with a history of  colon polyps and H. pylori s/p quadruple therapy seen today for ER follow up. She was last seen in clinic 10 days ago for follow-up of H. pylori.  She reported having did difficulty completing the entire quadruple therapy antibiotic course due to nausea and vomiting.  She reported completing most of the prescription and overall was feeling much better.  She was continuing to take omeprazole.  She was reporting sore but mild abdominal discomfort.  She was advised to have H. pylori eradication testing but never completed this.    She returns to clinic today for ER follow-up.  She presented to [facility] on 9/10 with complaints of increased abdominal pain, nausea, and vomiting since 9/4.  She also reported feeling constipated despite taking laxatives.  She reported poor p.o. intake for the last 3 days.  CBC unremarkable.  UA unremarkable.  CT imaging showed long segment sigmoid colon wall thickening, compatible with sigmoid diverticulitis or colitis.  There is no evidence of perforation, fluid collection or abscess.  She was found to have marked fecal retention within the proximal colon consistent with constipation but no bowel obstruction or ileus.  She was incidentally noted to have fatty liver and a small hiatal hernia.She was treated with Rocephin  and Flagyl  in the ER but discharged home on Cipro  and Flagyl  since she had a penicillin allergy.  She was given a prescription of lactulose  for constipation.  Past GI History:  CTAP W IV 02/2024--Long segment sigmoid colonic wall thickening, compatible with  sigmoid diverticulitis or colitis. No perforation, fluid collection, or abscess. Marked fecal retention within the more proximal colon, consistent with constipation. No bowel obstruction or ileus. Hepatic steatosis. Small hiatal hernia.  Aortic Atherosclerosis (ICD10-I70.0).   Colonoscopy  01/08/24 - One 15 mm polyp in the sigmoid colon, removed. TA on biopsy, negative for high grade dysplasia. Repeat colonoscopy in 3 years.   EGD 01/06/24 - No endoscopic esophageal abnormality to explain patient' s dysphagia. Esophagus dilated. Dilated. Chronic gastritis, characterized by erythema and granularity. Normal examined duodenum. Pathology showed active chronic H. Pylori associated gastritis, positive for H.pylori.   Past Medical History:  Diagnosis Date  . Barrett's esophagus 2011  . Bipolar disorder (*)   . Cigarette smoker    quitted 6 years in the past  . Fibromyalgia 2008  . Gastric ulcer   . History of adenomatous polyp of colon 01/13/2024   Likely prior AA at [facility], 1.5cm adenoma 12/2023  82yr recall- repeat 12/2026 ([facility])   . Hypertension 08/05/2014   Occasionally  . Migraine    without aura  . Moderate tetrahydrocannabinol (THC) dependence (*)     Past Surgical History:  Procedure Laterality Date  . Colonoscopy    . Hysterectomy    . Upper gastrointestinal endoscopy       Family & Social History:  family history includes Arthritis in her mother; Depression in her daughter, father, and mother; Diabetes in her brother; Drug abuse in her mother; Schizophrenia in her sister; Suicidality (age of onset: 39) in her father.    reports that she has quit smoking. Her smoking use included cigarettes. She started smoking about 45 years ago. She has a 22.8 pack-year smoking history. She has never been exposed to tobacco smoke. She has never used smokeless tobacco. She reports that she does not currently use alcohol. She reports current drug use. Frequency: 1.00 time per week. Drugs: Marijuana and Other-see comments.     Review of Systems: Review of Systems  Constitutional:  Negative for appetite change, chills, fatigue, fever and unexpected weight change.  HENT:  Negative for trouble swallowing.   Respiratory:  Negative for shortness of breath.   Cardiovascular:   Negative for chest pain.  Gastrointestinal:  Positive for abdominal pain and constipation (not typically her baseline). Negative for blood in stool, diarrhea, nausea and vomiting.  Neurological:  Negative for dizziness.      Physical Exam:  BP (!) 170/106 (BP Location: Right Upper Arm, Patient Position: Sitting) Comment: pt std bp has been elevated since her visit at the ED on 9/10  Pulse 98   Temp 97.8 F (36.6 C) (Temporal)   Ht 5' 7 (1.702 m)   Wt 212 lb 12.8 oz (96.5 kg)   LMP  (LMP Unknown)   BMI 33.33 kg/m   Physical Exam Vitals reviewed.  Constitutional:      General: She is not in acute distress.    Appearance: Normal appearance. She is not ill-appearing, toxic-appearing or diaphoretic.  HENT:     Head: Normocephalic and atraumatic.  Eyes:     General: No scleral icterus.    Extraocular Movements: Extraocular movements intact.  Cardiovascular:     Rate and Rhythm: Normal rate.  Pulmonary:     Effort: Pulmonary effort is normal. No respiratory distress.  Abdominal:     General: There is no distension.  Skin:    Coloration: Skin is not jaundiced.  Findings: No bruising.  Neurological:     General: No focal deficit present.     Mental Status: She is alert and oriented to person, place, and time.     Gait: Gait abnormal (baseline; cane).  Psychiatric:        Mood and Affect: Mood normal.        Behavior: Behavior normal.        Thought Content: Thought content normal.        Judgment: Judgment normal.      Patient's Medications       * Accurate as of February 27, 2024  3:41 PM. Reflects encounter med changes as of last refresh          Continued Medications      Instructions  ACCU-CHEK GUIDE ME w/Device Kit  Is directed to check blood sugar once daily.   amLODIPine  besylate 10 mg tablet Commonly known as: NORVASC   10 mg, Daily   cyclobenzaprine  10 mg tablet Commonly known as: FLEXERIL   10 mg, At bedtime   dicyclomine  10 mg  capsule Commonly known as: BENTYL   10 mg, Oral, 30 minutes before meals & at bedtime   fluticasone propionate 50 mcg/actuation nasal spray Commonly known as: FLONASE  1 spray, Both Nostrils, Daily   loratadine 10 MG tablet Commonly known as: CLARITIN  10 mg, Oral, Daily   LORAzepam  0.5 mg tablet Commonly known as: ATIVAN   0.5 mg, Every 6 hours as needed   * Misc. Devices Misc  1- cane   * Walker Misc  Rolling walker with seat. Use as directed.   * Misc. Devices Misc  Cane x1 patient choice   omeprazole 40 mg capsule Commonly known as: PRILOSEC  40 mg, Oral, 2 times a day   ondansetron  4 mg tablet Commonly known as: ZOFRAN   4 mg, Oral, Every 8 hours as needed   zolpidem  tartrate 10 mg tablet Commonly known as: AMBIEN   1 tablet, At bedtime as needed      * * This list has 3 medication(s) that are the same as other medications prescribed for you. Read the directions carefully, and ask your doctor or other care provider to review them with you.          Modified Medications      Instructions  carBAMazepine  200 mg 12 hr tablet Commonly known as: TEGRETOL  XR What changed: Another medication with the same name was removed. Continue taking this medication, and follow the directions you see here. Changed by: Blane Hammed, PA-C  TAKE 1 TABLET(200 MG) BY MOUTH TWICE DAILY   CYMBALTA  60 MG capsule Generic drug: DULoxetine  HCl What changed: Another medication with the same name was removed. Continue taking this medication, and follow the directions you see here. Changed by: Christa Hubert, PA-C  60 mg, Daily       Discontinued Medications    ARIPiprazole  20 MG tablet Commonly known as: ABILIFY  Stopped by: Blane Hammed, PA-C   metoprolol  succinate 25 mg 24 hr tablet Commonly known as: TOPROL -XL Stopped by: Blane Hammed, PA-C       *Some images could not be shown.

## 2024-03-04 ENCOUNTER — Emergency Department (HOSPITAL_COMMUNITY)

## 2024-03-04 ENCOUNTER — Inpatient Hospital Stay (HOSPITAL_COMMUNITY)
Admission: EM | Admit: 2024-03-04 | Discharge: 2024-03-15 | DRG: 392 | Disposition: A | Discharge status: Home Health | Attending: Family Medicine | Admitting: Family Medicine

## 2024-03-04 ENCOUNTER — Other Ambulatory Visit: Payer: Self-pay

## 2024-03-04 DIAGNOSIS — Z6833 Body mass index (BMI) 33.0-33.9, adult: Secondary | ICD-10-CM

## 2024-03-04 DIAGNOSIS — R933 Abnormal findings on diagnostic imaging of other parts of digestive tract: Secondary | ICD-10-CM

## 2024-03-04 DIAGNOSIS — K760 Fatty (change of) liver, not elsewhere classified: Secondary | ICD-10-CM | POA: Diagnosis present

## 2024-03-04 DIAGNOSIS — K6389 Other specified diseases of intestine: Secondary | ICD-10-CM | POA: Diagnosis present

## 2024-03-04 DIAGNOSIS — E876 Hypokalemia: Secondary | ICD-10-CM | POA: Diagnosis present

## 2024-03-04 DIAGNOSIS — K5900 Constipation, unspecified: Principal | ICD-10-CM | POA: Diagnosis present

## 2024-03-04 DIAGNOSIS — R21 Rash and other nonspecific skin eruption: Secondary | ICD-10-CM | POA: Diagnosis not present

## 2024-03-04 DIAGNOSIS — M797 Fibromyalgia: Secondary | ICD-10-CM | POA: Diagnosis present

## 2024-03-04 DIAGNOSIS — K5732 Diverticulitis of large intestine without perforation or abscess without bleeding: Secondary | ICD-10-CM | POA: Diagnosis present

## 2024-03-04 DIAGNOSIS — R109 Unspecified abdominal pain: Secondary | ICD-10-CM | POA: Diagnosis not present

## 2024-03-04 DIAGNOSIS — Z87891 Personal history of nicotine dependence: Secondary | ICD-10-CM

## 2024-03-04 DIAGNOSIS — E66811 Obesity, class 1: Secondary | ICD-10-CM | POA: Diagnosis present

## 2024-03-04 DIAGNOSIS — K5792 Diverticulitis of intestine, part unspecified, without perforation or abscess without bleeding: Principal | ICD-10-CM | POA: Diagnosis present

## 2024-03-04 DIAGNOSIS — Z90710 Acquired absence of both cervix and uterus: Secondary | ICD-10-CM

## 2024-03-04 DIAGNOSIS — I10 Essential (primary) hypertension: Secondary | ICD-10-CM | POA: Diagnosis present

## 2024-03-04 DIAGNOSIS — Z79899 Other long term (current) drug therapy: Secondary | ICD-10-CM

## 2024-03-04 DIAGNOSIS — K529 Noninfective gastroenteritis and colitis, unspecified: Secondary | ICD-10-CM

## 2024-03-04 DIAGNOSIS — Z8711 Personal history of peptic ulcer disease: Secondary | ICD-10-CM

## 2024-03-04 DIAGNOSIS — Z885 Allergy status to narcotic agent status: Secondary | ICD-10-CM

## 2024-03-04 DIAGNOSIS — F319 Bipolar disorder, unspecified: Secondary | ICD-10-CM | POA: Diagnosis present

## 2024-03-04 DIAGNOSIS — K219 Gastro-esophageal reflux disease without esophagitis: Secondary | ICD-10-CM | POA: Diagnosis present

## 2024-03-04 DIAGNOSIS — Z880 Allergy status to penicillin: Secondary | ICD-10-CM

## 2024-03-04 DIAGNOSIS — Z886 Allergy status to analgesic agent status: Secondary | ICD-10-CM

## 2024-03-04 LAB — CBC
HCT: 45.2 % (ref 36.0–46.0)
Hemoglobin: 14.6 g/dL (ref 12.0–15.0)
MCH: 31.5 pg (ref 26.0–34.0)
MCHC: 32.3 g/dL (ref 30.0–36.0)
MCV: 97.6 fL (ref 80.0–100.0)
Platelets: 170 K/uL (ref 150–400)
RBC: 4.63 MIL/uL (ref 3.87–5.11)
RDW: 13.8 % (ref 11.5–15.5)
WBC: 8.8 K/uL (ref 4.0–10.5)
nRBC: 0 % (ref 0.0–0.2)

## 2024-03-04 LAB — COMPREHENSIVE METABOLIC PANEL WITH GFR
ALT: 14 U/L (ref 0–44)
AST: 20 U/L (ref 15–41)
Albumin: 4.2 g/dL (ref 3.5–5.0)
Alkaline Phosphatase: 65 U/L (ref 38–126)
Anion gap: 15 (ref 5–15)
BUN: 10 mg/dL (ref 8–23)
CO2: 20 mmol/L — ABNORMAL LOW (ref 22–32)
Calcium: 9.5 mg/dL (ref 8.9–10.3)
Chloride: 104 mmol/L (ref 98–111)
Creatinine, Ser: 0.99 mg/dL (ref 0.44–1.00)
GFR, Estimated: >60 mL/min (ref >60)
Glucose, Bld: 97 mg/dL (ref 70–99)
Potassium: 3.6 mmol/L (ref 3.5–5.1)
Sodium: 139 mmol/L (ref 135–145)
Total Bilirubin: 0.6 mg/dL (ref 0.0–1.2)
Total Protein: 7.3 g/dL (ref 6.5–8.1)

## 2024-03-04 LAB — I-STAT CHEM 8, ED
BUN: 10 mg/dL (ref 8–23)
Calcium, Ion: 1 mmol/L — ABNORMAL LOW (ref 1.15–1.40)
Chloride: 108 mmol/L (ref 98–111)
Creatinine, Ser: 1.1 mg/dL — ABNORMAL HIGH (ref 0.44–1.00)
Glucose, Bld: 97 mg/dL (ref 70–99)
HCT: 44 % (ref 36.0–46.0)
Hemoglobin: 15 g/dL (ref 12.0–15.0)
Potassium: 3.6 mmol/L (ref 3.5–5.1)
Sodium: 141 mmol/L (ref 135–145)
TCO2: 24 mmol/L (ref 22–32)

## 2024-03-04 LAB — LIPASE, BLOOD: Lipase: 17 U/L (ref 11–51)

## 2024-03-04 MED ORDER — IOHEXOL 350 MG/ML SOLN
75.0000 mL | Freq: Once | INTRAVENOUS | Status: AC | PRN
  Administered 2024-03-04: 75 mL via INTRAVENOUS

## 2024-03-04 NOTE — ED Provider Triage Note (Signed)
 Emergency Medicine Provider Triage Evaluation Note  Pamela Graham , a 68 y.o. female  was evaluated in triage.  Pt complains of right lower quadrant pain, dizziness, nausea and right leg pain.  Review of Systems  Positive: Right lower quadrant pain, right leg pain dizziness nausea constipation Negative: Chest pain, shortness of breath, focal weaknesses, syncope  Physical Exam  BP (!) 142/109 (BP Location: Left Arm)   Pulse (!) 102   Temp 98.1 F (36.7 C) (Oral)   Resp 19   Ht 5' 7 (1.702 m)   Wt 97.5 kg   SpO2 100%   BMI 33.67 kg/m  Gen:   Awake, no distress   Resp:  Normal effort and no obvious respiratory distress MSK:   Moves extremities without difficulty, reporting pain down her right leg Other:  Right lower quadrant pain on palpation.   Medical Decision Making  Medically screening exam initiated at 4:47 PM.  Appropriate orders placed.  Pamela Graham was informed that the remainder of the evaluation will be completed by another provider, this initial triage assessment does not replace that evaluation, and the importance of remaining in the ED until their evaluation is complete.  68 year old female presents to the ED with complaints of right lower quadrant pain and constipation x 3 weeks.  Patient reports associated dizziness and nausea.  Patient advises that she was seen by GI doctor and he performed a colonoscopy in August and diagnosed her with diverticulosis and reported that she had some retained stool.  He had been treating her with reported antibiotics.  Patient advises that she has not had a bowel movement in 3 weeks and she is having worsening right lower quadrant pain.  Her GI doctor told her to come to the ED today.  Patient reports she was seen in the ED last week and discharged without intervention.  Patient is in no acute distress in no obvious respiratory distress and denies any chest pain.    Pamela Graham, [place] 03/04/24 1651

## 2024-03-04 NOTE — ED Triage Notes (Signed)
 Right sided flank pain since getting a colostomy placed a month ago.

## 2024-03-04 NOTE — ED Notes (Signed)
 Pt requesting her colostomy be removed.

## 2024-03-05 DIAGNOSIS — Z8711 Personal history of peptic ulcer disease: Secondary | ICD-10-CM | POA: Diagnosis not present

## 2024-03-05 DIAGNOSIS — R197 Diarrhea, unspecified: Secondary | ICD-10-CM | POA: Diagnosis not present

## 2024-03-05 DIAGNOSIS — R933 Abnormal findings on diagnostic imaging of other parts of digestive tract: Secondary | ICD-10-CM | POA: Diagnosis not present

## 2024-03-05 DIAGNOSIS — K5900 Constipation, unspecified: Secondary | ICD-10-CM | POA: Diagnosis present

## 2024-03-05 DIAGNOSIS — Z79899 Other long term (current) drug therapy: Secondary | ICD-10-CM | POA: Diagnosis not present

## 2024-03-05 DIAGNOSIS — Z886 Allergy status to analgesic agent status: Secondary | ICD-10-CM | POA: Diagnosis not present

## 2024-03-05 DIAGNOSIS — Z885 Allergy status to narcotic agent status: Secondary | ICD-10-CM | POA: Diagnosis not present

## 2024-03-05 DIAGNOSIS — Z880 Allergy status to penicillin: Secondary | ICD-10-CM | POA: Diagnosis not present

## 2024-03-05 DIAGNOSIS — K760 Fatty (change of) liver, not elsewhere classified: Secondary | ICD-10-CM | POA: Diagnosis present

## 2024-03-05 DIAGNOSIS — K5732 Diverticulitis of large intestine without perforation or abscess without bleeding: Secondary | ICD-10-CM | POA: Diagnosis present

## 2024-03-05 DIAGNOSIS — R21 Rash and other nonspecific skin eruption: Secondary | ICD-10-CM | POA: Diagnosis not present

## 2024-03-05 DIAGNOSIS — R1030 Lower abdominal pain, unspecified: Secondary | ICD-10-CM | POA: Diagnosis not present

## 2024-03-05 DIAGNOSIS — K219 Gastro-esophageal reflux disease without esophagitis: Secondary | ICD-10-CM | POA: Diagnosis present

## 2024-03-05 DIAGNOSIS — K5730 Diverticulosis of large intestine without perforation or abscess without bleeding: Secondary | ICD-10-CM | POA: Diagnosis not present

## 2024-03-05 DIAGNOSIS — K5792 Diverticulitis of intestine, part unspecified, without perforation or abscess without bleeding: Principal | ICD-10-CM

## 2024-03-05 DIAGNOSIS — R1031 Right lower quadrant pain: Secondary | ICD-10-CM | POA: Diagnosis not present

## 2024-03-05 DIAGNOSIS — R1084 Generalized abdominal pain: Secondary | ICD-10-CM | POA: Diagnosis not present

## 2024-03-05 DIAGNOSIS — Z6833 Body mass index (BMI) 33.0-33.9, adult: Secondary | ICD-10-CM | POA: Diagnosis not present

## 2024-03-05 DIAGNOSIS — E876 Hypokalemia: Secondary | ICD-10-CM | POA: Diagnosis present

## 2024-03-05 DIAGNOSIS — E66811 Obesity, class 1: Secondary | ICD-10-CM | POA: Diagnosis present

## 2024-03-05 DIAGNOSIS — Z90710 Acquired absence of both cervix and uterus: Secondary | ICD-10-CM | POA: Diagnosis not present

## 2024-03-05 DIAGNOSIS — K6389 Other specified diseases of intestine: Secondary | ICD-10-CM | POA: Diagnosis present

## 2024-03-05 DIAGNOSIS — K529 Noninfective gastroenteritis and colitis, unspecified: Secondary | ICD-10-CM | POA: Diagnosis not present

## 2024-03-05 DIAGNOSIS — Z87891 Personal history of nicotine dependence: Secondary | ICD-10-CM | POA: Diagnosis not present

## 2024-03-05 DIAGNOSIS — I10 Essential (primary) hypertension: Secondary | ICD-10-CM | POA: Diagnosis present

## 2024-03-05 DIAGNOSIS — M797 Fibromyalgia: Secondary | ICD-10-CM | POA: Diagnosis present

## 2024-03-05 DIAGNOSIS — F319 Bipolar disorder, unspecified: Secondary | ICD-10-CM | POA: Diagnosis present

## 2024-03-05 DIAGNOSIS — R109 Unspecified abdominal pain: Secondary | ICD-10-CM | POA: Diagnosis present

## 2024-03-05 LAB — BASIC METABOLIC PANEL WITH GFR
Anion gap: 11 (ref 5–15)
BUN: 6 mg/dL — ABNORMAL LOW (ref 8–23)
CO2: 23 mmol/L (ref 22–32)
Calcium: 9.2 mg/dL (ref 8.9–10.3)
Chloride: 105 mmol/L (ref 98–111)
Creatinine, Ser: 0.88 mg/dL (ref 0.44–1.00)
GFR, Estimated: >60 mL/min (ref >60)
Glucose, Bld: 128 mg/dL — ABNORMAL HIGH (ref 70–99)
Potassium: 3.1 mmol/L — ABNORMAL LOW (ref 3.5–5.1)
Sodium: 139 mmol/L (ref 135–145)

## 2024-03-05 LAB — CBC
HCT: 41.9 % (ref 36.0–46.0)
Hemoglobin: 13.8 g/dL (ref 12.0–15.0)
MCH: 30.9 pg (ref 26.0–34.0)
MCHC: 32.9 g/dL (ref 30.0–36.0)
MCV: 93.9 fL (ref 80.0–100.0)
Platelets: 167 K/uL (ref 150–400)
RBC: 4.46 MIL/uL (ref 3.87–5.11)
RDW: 13.7 % (ref 11.5–15.5)
WBC: 9 K/uL (ref 4.0–10.5)
nRBC: 0 % (ref 0.0–0.2)

## 2024-03-05 LAB — MAGNESIUM: Magnesium: 1.9 mg/dL (ref 1.7–2.4)

## 2024-03-05 LAB — PHOSPHORUS: Phosphorus: 3.7 mg/dL (ref 2.5–4.6)

## 2024-03-05 LAB — URINALYSIS, ROUTINE W REFLEX MICROSCOPIC
Bilirubin Urine: NEGATIVE
Glucose, UA: NEGATIVE mg/dL
Hgb urine dipstick: NEGATIVE
Ketones, ur: 5 mg/dL — AB
Leukocytes,Ua: NEGATIVE
Nitrite: NEGATIVE
Protein, ur: NEGATIVE mg/dL
Specific Gravity, Urine: >1.046 — ABNORMAL HIGH (ref 1.005–1.030)
pH: 5 (ref 5.0–8.0)

## 2024-03-05 LAB — HIV ANTIBODY (ROUTINE TESTING W REFLEX): HIV Screen 4th Generation wRfx: NONREACTIVE

## 2024-03-05 MED ORDER — METRONIDAZOLE 500 MG/100ML IV SOLN
500.0000 mg | Freq: Once | INTRAVENOUS | Status: DC
  Filled 2024-03-05: qty 100

## 2024-03-05 MED ORDER — LACTATED RINGERS IV SOLN: INTRAVENOUS | Status: AC

## 2024-03-05 MED ORDER — PANTOPRAZOLE SODIUM 40 MG PO TBEC
40.0000 mg | DELAYED_RELEASE_TABLET | Freq: Every day | ORAL | Status: DC
  Administered 2024-03-05 – 2024-03-15 (×11): 40 mg via ORAL
  Filled 2024-03-05 (×11): qty 1

## 2024-03-05 MED ORDER — SODIUM CHLORIDE 0.9 % IV SOLN
1.0000 g | Freq: Once | INTRAVENOUS | Status: DC
  Filled 2024-03-05: qty 10

## 2024-03-05 MED ORDER — POLYETHYLENE GLYCOL 3350 17 G PO PACK
17.0000 g | PACK | Freq: Two times a day (BID) | ORAL | Status: DC
  Administered 2024-03-05 – 2024-03-07 (×5): 17 g via ORAL
  Filled 2024-03-05 (×5): qty 1

## 2024-03-05 MED ORDER — OXYCODONE HCL 5 MG PO TABS
5.0000 mg | ORAL_TABLET | Freq: Four times a day (QID) | ORAL | Status: DC | PRN
  Administered 2024-03-05 – 2024-03-14 (×23): 5 mg via ORAL
  Filled 2024-03-05 (×25): qty 1

## 2024-03-05 MED ORDER — POLYETHYLENE GLYCOL 3350 17 G PO PACK: 17.0000 g | PACK | Freq: Every day | ORAL | Status: DC | PRN

## 2024-03-05 MED ORDER — METRONIDAZOLE 500 MG/100ML IV SOLN
500.0000 mg | Freq: Two times a day (BID) | INTRAVENOUS | Status: DC
  Administered 2024-03-05 – 2024-03-14 (×19): 500 mg via INTRAVENOUS
  Filled 2024-03-05 (×18): qty 100

## 2024-03-05 MED ORDER — DULOXETINE HCL 60 MG PO CPEP
60.0000 mg | ORAL_CAPSULE | Freq: Every day | ORAL | Status: DC
  Administered 2024-03-05 – 2024-03-15 (×11): 60 mg via ORAL
  Filled 2024-03-05 (×11): qty 1

## 2024-03-05 MED ORDER — ONDANSETRON HCL 4 MG/2ML IJ SOLN
4.0000 mg | Freq: Four times a day (QID) | INTRAMUSCULAR | Status: DC | PRN
  Administered 2024-03-05 – 2024-03-13 (×7): 4 mg via INTRAVENOUS
  Filled 2024-03-05 (×7): qty 2

## 2024-03-05 MED ORDER — MELATONIN 5 MG PO TABS: 5.0000 mg | ORAL_TABLET | Freq: Every evening | ORAL | Status: DC | PRN

## 2024-03-05 MED ORDER — ZOLPIDEM TARTRATE 5 MG PO TABS
5.0000 mg | ORAL_TABLET | Freq: Every evening | ORAL | Status: DC | PRN
Start: 2024-03-05 — End: 2024-03-15
  Administered 2024-03-06 – 2024-03-14 (×4): 5 mg via ORAL
  Filled 2024-03-05 (×4): qty 1

## 2024-03-05 MED ORDER — SENNOSIDES-DOCUSATE SODIUM 8.6-50 MG PO TABS
1.0000 | ORAL_TABLET | Freq: Every day | ORAL | Status: DC
  Administered 2024-03-05 – 2024-03-06 (×2): 1 via ORAL
  Filled 2024-03-05 (×2): qty 1

## 2024-03-05 MED ORDER — MORPHINE SULFATE (PF) 2 MG/ML IV SOLN
2.0000 mg | INTRAVENOUS | Status: AC | PRN
  Administered 2024-03-07 – 2024-03-12 (×4): 2 mg via INTRAVENOUS
  Filled 2024-03-05 (×4): qty 1

## 2024-03-05 MED ORDER — POTASSIUM CHLORIDE CRYS ER 20 MEQ PO TBCR
40.0000 meq | EXTENDED_RELEASE_TABLET | Freq: Once | ORAL | Status: AC
  Administered 2024-03-05: 40 meq via ORAL
  Filled 2024-03-05: qty 2

## 2024-03-05 MED ORDER — CARBAMAZEPINE ER 200 MG PO TB12: 200.0000 mg | ORAL_TABLET | Freq: Two times a day (BID) | ORAL | Status: DC

## 2024-03-05 MED ORDER — MORPHINE SULFATE (PF) 4 MG/ML IV SOLN
4.0000 mg | Freq: Once | INTRAVENOUS | Status: AC
  Administered 2024-03-05: 4 mg via INTRAVENOUS
  Filled 2024-03-05: qty 1

## 2024-03-05 MED ORDER — LORAZEPAM 0.5 MG PO TABS
0.5000 mg | ORAL_TABLET | ORAL | Status: DC | PRN
Start: 2024-03-05 — End: 2024-03-15
  Administered 2024-03-07 – 2024-03-08 (×2): 0.5 mg via ORAL
  Filled 2024-03-05 (×2): qty 1

## 2024-03-05 MED ORDER — DICYCLOMINE HCL 10 MG PO CAPS
10.0000 mg | ORAL_CAPSULE | Freq: Once | ORAL | Status: AC
  Administered 2024-03-05: 10 mg via ORAL
  Filled 2024-03-05: qty 1

## 2024-03-05 MED ORDER — PROCHLORPERAZINE EDISYLATE 10 MG/2ML IJ SOLN
5.0000 mg | Freq: Four times a day (QID) | INTRAMUSCULAR | Status: DC | PRN
  Administered 2024-03-05: 5 mg via INTRAVENOUS
  Filled 2024-03-05: qty 2

## 2024-03-05 MED ORDER — ENOXAPARIN SODIUM 40 MG/0.4ML IJ SOSY
40.0000 mg | PREFILLED_SYRINGE | INTRAMUSCULAR | Status: DC
Start: 2024-03-05 — End: 2024-03-13
  Administered 2024-03-05 – 2024-03-12 (×8): 40 mg via SUBCUTANEOUS
  Filled 2024-03-05 (×8): qty 0.4

## 2024-03-05 MED ORDER — CARBAMAZEPINE ER 200 MG PO TB12
200.0000 mg | ORAL_TABLET | Freq: Every day | ORAL | Status: DC
  Administered 2024-03-06 – 2024-03-15 (×10): 200 mg via ORAL
  Filled 2024-03-05 (×11): qty 1

## 2024-03-05 MED ORDER — POLYETHYLENE GLYCOL 3350 17 G PO PACK
17.0000 g | PACK | Freq: Every day | ORAL | Status: DC
Start: 2024-03-05 — End: 2024-03-05

## 2024-03-05 MED ORDER — MORPHINE SULFATE (PF) 2 MG/ML IV SOLN: 2.0000 mg | INTRAVENOUS | Status: DC | PRN

## 2024-03-05 MED ORDER — SODIUM CHLORIDE 0.9 % IV SOLN
2.0000 g | INTRAVENOUS | Status: DC
  Administered 2024-03-05 – 2024-03-07 (×3): 2 g via INTRAVENOUS
  Filled 2024-03-05 (×3): qty 20

## 2024-03-05 MED ORDER — AMLODIPINE BESYLATE 10 MG PO TABS
10.0000 mg | ORAL_TABLET | Freq: Every day | ORAL | Status: DC
  Administered 2024-03-05 – 2024-03-15 (×11): 10 mg via ORAL
  Filled 2024-03-05 (×11): qty 1

## 2024-03-05 MED ORDER — DICYCLOMINE HCL 10 MG PO CAPS
10.0000 mg | ORAL_CAPSULE | Freq: Three times a day (TID) | ORAL | Status: DC
  Administered 2024-03-05 – 2024-03-11 (×19): 10 mg via ORAL
  Filled 2024-03-05 (×19): qty 1

## 2024-03-05 MED ORDER — ACETAMINOPHEN 325 MG PO TABS
650.0000 mg | ORAL_TABLET | Freq: Four times a day (QID) | ORAL | Status: DC | PRN
  Administered 2024-03-07 – 2024-03-13 (×6): 650 mg via ORAL
  Filled 2024-03-05 (×6): qty 2

## 2024-03-05 MED ORDER — ENOXAPARIN SODIUM 40 MG/0.4ML IJ SOSY
40.0000 mg | PREFILLED_SYRINGE | Freq: Every day | INTRAMUSCULAR | Status: DC
  Filled 2024-03-05: qty 0.4

## 2024-03-05 MED ORDER — ONDANSETRON HCL 4 MG/2ML IJ SOLN
4.0000 mg | Freq: Once | INTRAMUSCULAR | Status: AC
  Administered 2024-03-05: 4 mg via INTRAVENOUS
  Filled 2024-03-05: qty 2

## 2024-03-05 MED ORDER — ONDANSETRON HCL 4 MG PO TABS
4.0000 mg | ORAL_TABLET | Freq: Four times a day (QID) | ORAL | Status: DC | PRN
  Administered 2024-03-06 – 2024-03-09 (×3): 4 mg via ORAL
  Filled 2024-03-05 (×3): qty 1

## 2024-03-05 NOTE — H&P (Addendum)
 History and Physical  Patient: Pamela Graham FMW:[id number] DOB: October 14, 1955 DOA: 03/04/2024 DOS: the patient was seen and examined on 03/05/2024 Patient coming from: Home  Chief Complaint: Abdominal pain  HPI: Patient with PMH of HTN, bipolar, Barrett's, migraine, fibromyalgia presents the hospital with complaints of abdominal pain. Reports that end of July she went for a colonoscopy. Since then she has some abdominal pain. Was treated for H. pylori infection. Presented to ED on 9/10 for complaints of abdominal pain and constipation. Went to see her primary GI on 9/12 who recommended CT abdomen which showed evidence of diverticulitis versus colitis.  Started on Cipro  and Flagyl  and full liquid diet. Present again to ED on 9/18 for complaints of abdominal constipation. The stomach elevated for outpatient treatment failure.  Assessment and Plan: Severe constipation. Recurrent abdominal pain. Suspect that her pain is primarily coming from severe constipation based on the appearance. She reports spasmodic pain all over her abdomen primarily more on the right side. There is no evidence of ascending colon diverticulitis or colitis. For now I would be initiating aggressive bowel regimen. Full liquid diet. IV fluid. Monitor.  Diverticulitis versus colitis. Patient had completed her oral antibiotic outpatient. Continues to have abdominal pain.  CT shows evidence of ongoing presence of diverticulitis but no evidence of perforation or rupture. Admitted for concern for failed outpatient therapy. For now we will continue with the antibiotic and monitor for improvement.  Fibromyalgia and bipolar disorder. Home regimen includes Cymbalta  60 mg daily, Tegretol  200 mg daily, lorazepam  2 mg daily, Ambien  10 mg daily, Will continue most of the medications. Lorazepam  switched to as needed.  GERD. Continue PPI.  HTN. On amlodipine  10 mg daily which I will continue.  Chronic arthritis. Possible is  up with orthopedic outpatient. PT OT consulted. Walker arranged.   Hypokalemia. Replaced.  Advance Care Planning:   Code Status: Full Code  Consults: None  Prior to Admission medications   Medication Sig Start Date End Date Taking? Authorizing Provider  amLODipine  (NORVASC ) 10 MG tablet Take 10 mg by mouth daily.   Yes [provider]  carbamazepine  (TEGRETOL  XR) 200 MG 12 hr tablet Take 200 mg by mouth 2 (two) times daily.   Yes [provider]  cyclobenzaprine  (FLEXERIL ) 10 MG tablet Take 10 mg by mouth 2 (two) times daily as needed for muscle spasms.   Yes [provider]  DULoxetine  (CYMBALTA ) 60 MG capsule Take 60 mg by mouth daily. 02/20/24  Yes [provider]  fluticasone (FLONASE) 50 MCG/ACT nasal spray Place 1 spray into both nostrils daily as needed for allergies. 07/01/22  Yes [provider]  Lactulose  20 GM/30ML SOLN Take 30 mLs (20 g total) by mouth daily as needed (constipation). 02/25/24  Yes Haviland, Julie, MD  loratadine (CLARITIN) 10 MG tablet Take 10 mg by mouth daily as needed for allergies or rhinitis.   Yes [provider]  LOREEV XR  2 MG CS24 Take 1 capsule by mouth daily. 02/20/24  Yes [provider]  naloxone (NARCAN) nasal spray 4 mg/0.1 mL Place 1 spray into the nose. 05/08/23  Yes [provider]  omeprazole (PRILOSEC) 40 MG capsule Take 40 mg by mouth daily before breakfast. 11/20/18  Yes [provider]  ondansetron  (ZOFRAN ) 4 MG tablet Take 4 mg by mouth every 8 (eight) hours as needed. 03/04/24  Yes [provider]  Propylene Glycol, PF, (SYSTANE COMPLETE PF) 0.6 % SOLN Apply 2 drops to eye in the morning and at  bedtime.   Yes [provider]  zolpidem  (AMBIEN ) 10 MG tablet Take 10 mg by mouth at bedtime as needed. 02/20/24  Yes [provider]    Past Medical History:  Diagnosis Date   Barrett's esophagus    DDD (degenerative disc disease), lumbar     Fibromyalgia    Gastric ulcer    Hypertension    Migraines    Past Surgical History:  Procedure Laterality Date   ABDOMINAL HYSTERECTOMY     COLONOSCOPY     UPPER GI ENDOSCOPY     Social History:  reports that she has quit smoking. Her smoking use included cigarettes. She has never used smokeless tobacco. She reports current drug use. Drug: Marijuana. She reports that she does not drink alcohol. Allergies  Allergen Reactions   Haloperidol Shortness Of Breath, Itching and Rash   Lisinopril Shortness Of Breath   Other Other (See Comments)    Steroids- Patient stated she cannot have these because of 3rd stage kidney disease   Pregabalin Other (See Comments), Palpitations and Rash    Tachycardia   Risperidone And Paliperidone Shortness Of Breath, Itching and Rash   Gabapentin Other (See Comments) and Swelling    Gastrointestinal upset (Barrett's Esophagus irritation)  Other Reaction(s): GI Intolerance   Ibuprofen Rash   Meloxicam Rash and Other (See Comments)    Renal insufficiency    Naproxen  Other (See Comments)    GI Intolerance/Stomach pain   Penicillins Hives and Swelling    Has patient had a PCN reaction causing immediate rash, facial/tongue/throat swelling, SOB or lightheadedness with hypotension: Yes Has patient had a PCN reaction causing severe rash involving mucus membranes or skin necrosis: No Has patient had a PCN reaction that required hospitalization: No Has patient had a PCN reaction occurring within the last 10 years: No If all of the above answers are NO, then may proceed with Cephalosporin use.    Tramadol Rash   No family history on file. Physical Exam: Vitals:   03/05/24 0637 03/05/24 0648 03/05/24 1131 03/05/24 1245  BP: (!) 161/107 (!) 157/101 (!) 153/106 138/84  Pulse: 73   64  Resp: 17   16  Temp: 98.4 F (36.9 C)   98.2 F (36.8 C)  TempSrc: Oral   Oral  SpO2: 100%   100%  Weight:      Height:      Poor historian, requiring multiple prompts  to gather information.  After 3rd or 4th exam patient was able to tell the history.  Clear to auscultation S1-S2 present Alert awake and oriented. Bowel sound present.  Diffusely tender primarily on the right side. No edema No focal deficit  Data Reviewed: I have reviewed ED notes, Vitals, Lab results and outpatient records. Since last encounter, pertinent lab results CBC and BMP   . I have ordered test including CBC and BMP  .   Family Communication: No one at bedside  Author: Yetta Blanch, MD 03/05/2024 6:00 PM For on call review [email or web address].

## 2024-03-05 NOTE — TOC Initial Note (Signed)
 Transition of Care (TOC) - Initial/Assessment Note   Patient from home alone.  PT recommending HHPT , patient in agreement , has had [facility] in the past and woyuld like them again.   Kelly with [facility] accepted .   Patient has rollator at home, requesting rolling walker and quad cane. Explained NCM will contact PT for recommendations.   Patient received rollator 4 years ago through insurance. NCM explained insurance  only covers a walker every 5 years. [employer] will run insurance but if it is less then 5 years rolling walker will be private pay. Patient voiced understanding and wants NCM to order rolling walker .   PT recommended rolling walker but not the quad cane. Patient aware.   NCM entered orders for HHPT and rolling walker and secure chatted MD for signature.   Rolling walker ordered with Jermaine with [employer]  Patient Details  Name: Pamela Graham MRN: [id number] Date of Birth: 1955-10-08  Transition of [facility]) CM/SW Contact:    Stephane Powell Jansky, RN Phone Number: 03/05/2024, 1:42 PM  Clinical Narrative:                   Expected Discharge Plan: Home w Home Health Services Barriers to Discharge: Continued Medical Work up   Patient Goals and CMS Choice Patient states their goals for this hospitalization and ongoing recovery are:: to return to home CMS Medicare.gov Compare Post Acute Care list provided to:: Patient Choice offered to / list presented to : Patient      Expected Discharge Plan and Services   Discharge Planning Services: CM Consult Post Acute Care Choice: Home Health, Durable Medical Equipment Living arrangements for the past 2 months: Single Family Home                 DME Arranged: Walker rolling DME Agency: [employer] Date DME Agency Contacted: 03/05/24 Time DME Agency Contacted: 1341 Representative spoke with at DME Agency: [place] HH Arranged: PT HH Agency: [facility] Date [facility] Agency Contacted: 03/05/24 Time HH  Agency Contacted: 1342 Representative spoke with at [facility] Agency: [place]  Prior Living Arrangements/Services Living arrangements for the past 2 months: Single Family Home Lives with:: Self Patient language and need for interpreter reviewed:: Yes Do you feel safe going back to the place where you live?: Yes      Need for Family Participation in Patient Care: Yes (Comment) Care giver support system in place?: Yes (comment) Current home services: DME Criminal Activity/Legal Involvement Pertinent to Current Situation/Hospitalization: No - Comment as needed  Activities of Daily Living      Permission Sought/Granted   Permission granted to share information with : Yes, Verbal Permission Granted     Permission granted to share info w AGENCY: [facility] and [employer]        Emotional Assessment Appearance:: Appears stated age Attitude/Demeanor/Rapport: Engaged Affect (typically observed): Appropriate Orientation: : Oriented to Self, Oriented to Place, Oriented to  Time, Oriented to Situation Alcohol / Substance Use: Not Applicable Psych Involvement: No (comment)  Admission diagnosis:  Diverticulitis [K57.92] Patient Active Problem List   Diagnosis Date Noted   Diverticulitis 03/05/2024   Depression 06/30/2022   Bipolar 1 disorder (HCC) 11/20/2018   Moderate bipolar I disorder, most recent episode depressed (HCC)    Major depressive disorder, 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  [address] [place] [address] Phone: [phone] Fax: [phone]     Social Drivers of Health (SDOH) Social History: SDOH Screenings   Food Insecurity: No Food Insecurity (11/24/2023)   Received from [facility]  Housing: Low Risk  (11/24/2023)   Received from [facility]  Transportation Needs: No Transportation Needs (11/24/2023)   Received from  [facility]  Utilities: Not At Risk (11/24/2023)   Received from [facility]  Alcohol Screen: Low Risk  (11/20/2018)  Financial Resource Strain: Low Risk  (11/24/2023)   Received from [facility]  Physical Activity: Insufficiently Active (09/06/2022)   Received from [facility]  Social Connections: Socially Integrated (09/06/2022)   Received from [facility]  Stress: Stress Concern Present (09/06/2022)   Received from [facility]  Tobacco Use: Medium Risk (02/27/2024)   Received from [facility]   SDOH Interventions:     Readmission Risk Interventions     No data to display

## 2024-03-05 NOTE — Progress Notes (Signed)
 Pt request for me to call her grandaughter [facility] She wanted updates, I explained that pt is getting a bowel regimen that started today and she will be given something tonight. Patient is aware that I have spoken to the granddaughter.

## 2024-03-05 NOTE — Evaluation (Addendum)
 Physical Therapy Evaluation Patient Details Name: Pamela Graham MRN: [id number] DOB: 26-Jan-1956 Today's Date: 03/05/2024  History of Present Illness  68 yo F adm 03/04/24 with Abdominal pain, diverticulitis. PMHx: HTN, fibromyalgia, bipolar disorder  Clinical Impression  Pt pleasant, lives alone with her cats with daughter in [place]. Pt reports frequent falls in home and tub with pt using cane and crutch despite presence of rollator and not utilizing tub bench. Pt educated for benefit of appropriate DME to help prevent falls and encouraged use. Pt with varied hx of weakness and back pain and difficult to assess acute change vs hx. Pt with decreased activity tolerance and independence who will benefit from acute therapy and HHPT to maximize function. Encouraged OOB and daily ambulation with staff.         If plan is discharge home, recommend the following: Assistance with cooking/housework   Can travel by [employer], multimedia (2 wheels)  Recommendations for Other Services       Functional Status Assessment Patient has had a recent decline in their functional status and/or demonstrates limited ability to make significant improvements in function in a reasonable and predictable amount of time     Precautions / Restrictions Precautions Precautions: Fall Recall of Precautions/Restrictions: Intact      Mobility  Bed Mobility Overal bed mobility: Modified Independent                  Transfers Overall transfer level: Modified independent                      Ambulation/Gait Ambulation/Gait assistance: Supervision Gait Distance (Feet): 300 Feet Assistive device: Rolling walker (2 wheels) Gait Pattern/deviations: Step-through pattern, Decreased stride length   Gait velocity interpretation: 1.31 - 2.62 ft/sec, indicative of limited community ambulator   General Gait Details: pt with reliance on RW with gait, cues for  direction, no noted instability with RW  Stairs            Wheelchair Mobility     Tilt Bed    Modified Rankin (Stroke Patients Only)       Balance Overall balance assessment: History of Falls                                           Pertinent Vitals/Pain Pain Assessment Pain Assessment: No/denies pain    Home Living Family/patient expects to be discharged to:: Private residence Living Arrangements: Alone Available Help at Discharge: Family;Available PRN/intermittently Type of Home: House Home Access: Stairs to enter   Entrance Stairs-Number of Steps: 3   Home Layout: One level Home Equipment: Rollator (4 wheels);Cane - single point;Crutches;Tub bench      Prior Function Prior Level of Function : Driving;Independent/Modified Independent;History of Falls (last six months)             Mobility Comments: pt reports using cane and crutch at baseline with frequent falls ADLs Comments: cast iron tub with bench but reports she steps over and has fallen out of the tub and in the tub several times     Extremity/Trunk Assessment   Upper Extremity Assessment Upper Extremity Assessment: Overall WFL for tasks assessed    Lower Extremity Assessment Lower Extremity Assessment: Overall WFL for tasks assessed (pt initially with limited effort for resistance and with repetition able to demonstrate 5/5  hip flexion/knee flexion and extension bil legs)    Cervical / Trunk Assessment Cervical / Trunk Assessment: Normal  Communication   Communication Communication: No apparent difficulties    Cognition Arousal: Alert Behavior During Therapy: WFL for tasks assessed/performed   PT - Cognitive impairments: Safety/Judgement                         Following commands: Intact       Cueing Cueing Techniques: Verbal cues     General Comments      Exercises     Assessment/Plan    PT Assessment Patient needs continued PT services   PT Problem List Decreased activity tolerance;Decreased balance;Decreased mobility       PT Treatment Interventions DME instruction;Gait training;Stair training;Functional mobility training;Therapeutic activities;Therapeutic exercise;Patient/family education    PT Goals (Current goals can be found in the Care Plan section)  Acute Rehab PT Goals Patient Stated Goal: return home and not fall PT Goal Formulation: With patient Time For Goal Achievement: 03/19/24 Potential to Achieve Goals: Fair    Frequency Min 1X/week     Co-evaluation               AM-PAC PT 6 Clicks Mobility  Outcome Measure Help needed turning from your back to your side while in a flat bed without using bedrails?: None Help needed moving from lying on your back to sitting on the side of a flat bed without using bedrails?: None Help needed moving to and from a bed to a chair (including a wheelchair)?: None Help needed standing up from a chair using your arms (e.g., wheelchair or bedside chair)?: None Help needed to walk in hospital room?: A Little Help needed climbing 3-5 steps with a railing? : A Little 6 Click Score: 22    End of Session Equipment Utilized During Treatment: Gait belt Activity Tolerance: Patient tolerated treatment well Patient left: in chair;with call bell/phone within reach;with chair alarm set Nurse Communication: Mobility status PT Visit Diagnosis: Other abnormalities of gait and mobility (R26.89);Difficulty in walking, not elsewhere classified (R26.2)    Time: 8865-8843 PT Time Calculation (min) (ACUTE ONLY): 22 min   Charges:   PT Evaluation $PT Eval Low Complexity: 1 Low   PT General Charges $$ ACUTE PT VISIT: 1 Visit         Lenoard SQUIBB, PT Acute Rehabilitation Services Office: [phone]   Lenoard NOVAK Regis Wiland 03/05/2024, 1:31 PM

## 2024-03-05 NOTE — ED Provider Notes (Signed)
  EMERGENCY DEPARTMENT AT [facility] Provider Note   CSN: [id number] Arrival date & time: 03/04/24  1558     Patient presents with: Abdominal Pain   Pamela Graham is a 68 y.o. female.   68 year old female with hx of fibromyalgia, HTN, bipolar d/o, barrett's espohagus, and migraines presents to the emergency department for persistent pain in her right lower abdomen.  She has had ongoing pain since her colonoscopy and endoscopy at [facility] at the end of July.  Was seen 1 week ago for these symptoms and diagnosed with diverticulitis as well as some retained stool in the proximal colon.  She has completed a course of ciprofloxacin  and Flagyl  without change to her symptoms.  She has been on a clear liquid diet, but continues to experience nausea.  Has been too hesitant to advance her diet further.    Followed by GI at [facility]. In the last month and a half has also completed quadruple therapy for H. pylori. Lives in a home alone  The history is provided by the patient. No language interpreter was used.  Abdominal Pain      Prior to Admission medications   Medication Sig Start Date End Date Taking? Authorizing Provider  carbamazepine  (TEGRETOL  XR) 400 MG 12 hr tablet Take by mouth. 02/20/24  Yes [provider]  dicyclomine  (BENTYL ) 10 MG capsule Take 10 mg by mouth every 6 (six) hours as needed. 03/04/24  Yes [provider]  DULoxetine  (CYMBALTA ) 20 MG capsule Take 20 mg by mouth daily. 03/04/24  Yes [provider]  DULoxetine  (CYMBALTA ) 60 MG capsule Take 60 mg by mouth daily. 02/20/24  Yes [provider]  LOREEV XR  2 MG CS24 Take 1 capsule by mouth daily. 02/20/24  Yes [provider]  naloxone (NARCAN) nasal spray 4 mg/0.1 mL Place 1 spray into the nose. 05/08/23  Yes [provider]  ondansetron  (ZOFRAN ) 4 MG tablet Take 4 mg by mouth every 8 (eight) hours as needed. 03/04/24  Yes [provider]  zolpidem   (AMBIEN ) 10 MG tablet Take 10 mg by mouth at bedtime as needed. 02/20/24  Yes [provider]  acetaminophen  (TYLENOL ) 500 MG tablet Take 1,000 mg by mouth every 4 (four) hours as needed (for fibromyalgia-related pain).    [provider]  ARIPiprazole  (ABILIFY ) 5 MG tablet Take 1 tablet (5 mg total) by mouth in the morning and at bedtime. 07/18/22 08/17/22  Kommor, Madison, MD  carbamazepine  (TEGRETOL  XR) 200 MG 12 hr tablet Take 200 mg by mouth 2 (two) times daily.    [provider]  carbamazepine  (TEGRETOL ) 100 MG chewable tablet Chew 1 tablet (100 mg total) by mouth 2 (two) times a day. 11/25/18   Malinda Rogue, MD  cephALEXin  (KEFLEX ) 500 MG capsule Take 1 capsule (500 mg total) by mouth 3 (three) times daily. 07/17/21   Griselda Norris, MD  ciprofloxacin  (CIPRO ) 500 MG tablet Take 1 tablet (500 mg total) by mouth 2 (two) times daily. 02/25/24   Dean Clarity, MD  cyclobenzaprine  (FLEXERIL ) 10 MG tablet Take 10 mg by mouth 2 (two) times daily as needed for muscle spasms.    [provider]  cyclobenzaprine  (FLEXERIL ) 5 MG tablet Take 1 tablet (5 mg total) by mouth 2 (two) times daily as needed for muscle spasms. 07/03/20   Tegeler, Lonni PARAS, MD  diphenhydramine -acetaminophen  (TYLENOL  PM) 25-500 MG TABS tablet Take 4 tablets by mouth at bedtime.    [provider]  DULoxetine  (  CYMBALTA ) 30 MG capsule Take 90 mg by mouth daily.    [provider]  fluticasone (FLONASE) 50 MCG/ACT nasal spray Place 1 spray into both nostrils daily as needed for allergies. 07/01/22   [provider]  hydrochlorothiazide  (HYDRODIURIL ) 25 MG tablet Take 1 tablet (25 mg total) by mouth daily. Patient not taking: Reported on 07/03/2020 08/23/17   Money, Caron NOVAK, FNP  HYDROcodone -acetaminophen  (NORCO/VICODIN) 5-325 MG tablet Take 1 tablet by mouth every 4 (four) hours as needed. 02/25/24   Dean Clarity, MD  Lactulose  20 GM/30ML SOLN Take 30 mLs (20 g total) by  mouth daily as needed (constipation). 02/25/24   Dean Clarity, MD  lidocaine  (LIDODERM ) 5 % Place 1 patch onto the skin daily. Remove & Discard patch within 12 hours or as directed by MD 07/03/20   Tegeler, Lonni PARAS, MD  loratadine (CLARITIN) 10 MG tablet Take 10 mg by mouth daily as needed for allergies or rhinitis.    [provider]  metoprolol  succinate (TOPROL -XL) 25 MG 24 hr tablet Take 25 mg by mouth daily. 05/25/20   [provider]  metroNIDAZOLE  (FLAGYL ) 500 MG tablet Take 1 tablet (500 mg total) by mouth 2 (two) times daily. 02/25/24   Haviland, Julie, MD  omeprazole (PRILOSEC) 40 MG capsule Take 40 mg by mouth daily before breakfast. 11/20/18   [provider]  ondansetron  (ZOFRAN -ODT) 4 MG disintegrating tablet Take 1 tablet (4 mg total) by mouth every 8 (eight) hours as needed. 02/25/24   Dean Clarity, MD  oxyCODONE  (ROXICODONE ) 5 MG immediate release tablet Take 1 tablet (5 mg total) by mouth every 4 (four) hours as needed for severe pain. Patient not taking: Reported on 08/28/2022 02/18/21   Theadore Ozell HERO, MD  perphenazine  (TRILAFON ) 4 MG tablet 1 in am 2 at h s Patient not taking: Reported on 07/03/2020 11/25/18   Malinda Rogue, MD  prazosin  (MINIPRESS ) 2 MG capsule Take 2 capsules (4 mg total) by mouth at bedtime. Patient not taking: Reported on 07/03/2020 11/25/18   Malinda Rogue, MD  prazosin  (MINIPRESS ) 5 MG capsule Take 5 mg by mouth at bedtime as needed (for night terrors).    [provider]  temazepam  (RESTORIL ) 30 MG capsule Take 1 capsule (30 mg total) by mouth at bedtime. Patient not taking: Reported on 07/03/2020 11/25/18   Malinda Rogue, MD  TURMERIC PO Take 1 capsule by mouth daily with breakfast.    [provider]    Allergies: Haloperidol, Lisinopril, Other, Pregabalin, Risperidone and paliperidone, Gabapentin, Ibuprofen, Meloxicam, Naproxen , Penicillins, and Tramadol    Review of Systems  Gastrointestinal:  Positive for  abdominal pain.  Ten systems reviewed and are negative for acute change, except as noted in the HPI.    Updated Vital Signs BP (!) 158/110   Pulse 80   Temp 99 F (37.2 C)   Resp 20   Ht 5' 7 (1.702 m)   Wt 97.5 kg   SpO2 97%   BMI 33.67 kg/m   Physical Exam Vitals and nursing note reviewed.  Constitutional:      General: She is not in acute distress.    Appearance: She is well-developed. She is not diaphoretic.     Comments: Nontoxic appearing and in NAD  HENT:     Head: Normocephalic and atraumatic.  Eyes:     General: No scleral icterus.    Conjunctiva/sclera: Conjunctivae normal.  Cardiovascular:     Rate and Rhythm: Normal rate and regular rhythm.  Pulses: Normal pulses.  Pulmonary:     Effort: Pulmonary effort is normal. No respiratory distress.     Comments: Respirations even and unlabored Abdominal:     Palpations: Abdomen is soft.     Tenderness: There is abdominal tenderness. There is guarding (voluntary).     Comments: Focal TTP in the RLQ. Abdomen soft, obese. No peritoneal signs.  Musculoskeletal:        General: Normal range of motion.     Cervical back: Normal range of motion.  Skin:    General: Skin is warm and dry.     Coloration: Skin is not pale.     Findings: No erythema or rash.  Neurological:     Mental Status: She is alert and oriented to person, place, and time.  Psychiatric:        Behavior: Behavior normal.     (all labs ordered are listed, but only abnormal results are displayed) Labs Reviewed  COMPREHENSIVE METABOLIC PANEL WITH GFR - Abnormal; Notable for the following components:      Result Value   CO2 20 (*)    All other components within normal limits  I-STAT CHEM 8, ED - Abnormal; Notable for the following components:   Creatinine, Ser 1.10 (*)    Calcium, Ion 1.00 (*)    All other components within normal limits  LIPASE, BLOOD  CBC  URINALYSIS, ROUTINE W REFLEX MICROSCOPIC    EKG: None  Radiology: CT ABDOMEN  PELVIS W CONTRAST Result Date: 03/04/2024 CLINICAL DATA:  Right lower quadrant abdominal pain, dizziness, nausea EXAM: CT ABDOMEN AND PELVIS WITH CONTRAST TECHNIQUE: Multidetector CT imaging of the abdomen and pelvis was performed using the standard protocol following bolus administration of intravenous contrast. RADIATION DOSE REDUCTION: This exam was performed according to the departmental dose-optimization program which includes automated exposure control, adjustment of the mA and/or kV according to patient size and/or use of iterative reconstruction technique. CONTRAST:  75mL OMNIPAQUE  IOHEXOL  350 MG/ML SOLN COMPARISON:  02/25/2024 FINDINGS: Lower chest: No acute pleural or parenchymal lung disease. Hepatobiliary: Hepatic steatosis. No focal liver abnormality is seen. No gallstones, gallbladder wall thickening, or biliary dilatation. Pancreas: Unremarkable. No pancreatic ductal dilatation or surrounding inflammatory changes. Spleen: Normal in size without focal abnormality. Adrenals/Urinary Tract: Adrenal glands are unremarkable. Kidneys are normal, without renal calculi, focal lesion, or hydronephrosis. Bladder is unremarkable. Stomach/Bowel: No bowel obstruction or ileus. There is persistent segmental wall thickening and pericolonic fat stranding involving the rectosigmoid colon, consistent with diverticulitis or colitis. No perforation, fluid collection, or abscess. Significant retained stool within the more proximal colon consistent with constipation. Normal gas-filled appendix right mid abdomen. Vascular/Lymphatic: Aortic atherosclerosis. No enlarged abdominal or pelvic lymph nodes. Reproductive: Status post hysterectomy. No adnexal masses. Other: No free fluid or free intraperitoneal gas. No abdominal wall hernia. Musculoskeletal: No acute or destructive bony abnormalities. Reconstructed images demonstrate no additional findings. IMPRESSION: 1. Persistent segmental wall thickening of the rectosigmoid  colon, compatible with diverticulitis or colitis. No perforation, fluid collection, or abscess. 2. Persistent marked fecal retention within the more proximal colon, consistent with constipation. No bowel obstruction or ileus. 3. Hepatic steatosis. 4.  Aortic Atherosclerosis (ICD10-I70.0). Electronically Signed   By: Ozell Daring M.D.   On: 03/04/2024 18:00     Procedures   Medications Ordered in the ED  metroNIDAZOLE  (FLAGYL ) IVPB 500 mg (has no administration in time range)  cefTRIAXone  (ROCEPHIN ) 1 g in sodium chloride  0.9 % 100 mL IVPB (has no administration in time range)  iohexol  (OMNIPAQUE ) 350 MG/ML injection 75 mL (75 mLs Intravenous Contrast Given 03/04/24 1752)  dicyclomine  (BENTYL ) capsule 10 mg (10 mg Oral Given 03/05/24 0203)  ondansetron  (ZOFRAN ) injection 4 mg (4 mg Intravenous Given 03/05/24 0202)  morphine  (PF) 4 MG/ML injection 4 mg (4 mg Intravenous Given 03/05/24 0203)    Clinical Course as of 03/05/24 0318  Fri Mar 05, 2024  0317 Spoke with Dr. Shona of [facility] who will assess the patient in the ED for admission. [KH]    Clinical Course User Index [KH] Keith Sor, PA-C                                 Medical Decision Making Risk Prescription drug management. Decision regarding hospitalization.   This patient presents to the ED for concern of RLQ pain, this involves an extensive number of treatment options, and is a complaint that carries with it a high risk of complications and morbidity.  The differential diagnosis includes appendicitis vs diverticulitis vs pSBO/SBO vs constipation vs ruptured viscous   Co morbidities that complicate the patient evaluation  Fibromyalgia Bipolar d/o HTN   Additional history obtained:  External records from outside source obtained and reviewed including GI f/u visit on 02/27/24.   Lab Tests:  I Ordered, and personally interpreted labs.  The pertinent results include:  Creatinine 1.1, CO2 20.   Imaging Studies  ordered:  I ordered imaging studies including CT abd/pelvis  I independently visualized and interpreted imaging which showed persistent segmental wall thickening of the rectosigmoid colon compatible with diverticulitis or colitis I agree with the radiologist interpretation   Cardiac Monitoring:  The patient was maintained on a cardiac monitor.  I personally viewed and interpreted the cardiac monitored which showed an underlying rhythm of: NSR   Medicines ordered and prescription drug management:  I ordered medication including Morphine , Bentyl  for pain and Zofran  for nausea  Reevaluation of the patient after these medicines showed that the patient improved I have reviewed the patients home medicines and have made adjustments as needed   Test Considered:  Lactic acid   Consultations Obtained:  I requested consultation with Dr. Shona of [facility] and discussed lab and imaging findings as well as pertinent plan - they will assess patient in the ED for admission   Problem List / ED Course:  Evidence of persistent rectosigmoid diverticulitis despite outpatient course of antibiotics.  The patient is afebrile and without leukocytosis, but continues to have localized pain in her right lower quadrant.  Started on IV antibiotics and given IV medications for pain.  Has had some symptomatic improvement.  Ultimately, feels uncomfortable with discharge plan given persistent pain and impact on ADLs; patient lives alone. [facility] consulted for admission.   Reevaluation:  After the interventions noted above, I reevaluated the patient and found that they have :remained stable   Social Determinants of Health:  Lives independently    Dispostion:  After consideration of the diagnostic results and the patients response to treatment, I feel that the patent would benefit from admission for IV abx for the management of diverticulitis. Presumed treatment failure with outpatient abx course.       Final  diagnoses:  Diverticulitis    ED Discharge Orders     None          Keith Sor, PA-C 03/05/24 [id number]    Jerral Meth, MD 03/05/24 [phone]

## 2024-03-05 NOTE — Hospital Course (Addendum)
 Patient with PMH of HTN, bipolar, Barrett's, migraine, fibromyalgia presents the hospital with complaints of abdominal pain. Reports that end of July she went for a colonoscopy. Since then she has some abdominal pain. Was treated for H. pylori infection. Presented to ED on 9/10 for complaints of abdominal pain and constipation. Went to see her primary GI on 9/12 who recommended CT abdomen which showed evidence of diverticulitis versus colitis.  Started on Cipro  and Flagyl  and full liquid diet. Present again to ED on 9/18 for complaints of abdominal constipation. Currently being treated for constipation.  Assessment and Plan: Severe constipation. Recurrent abdominal pain. Suspect that her pain is primarily coming from severe constipation based on the appearance. She reports spasmodic pain all over her abdomen primarily more on the right side. There is no evidence of ascending colon diverticulitis or colitis. Continue bowel regimen. Advance diet to soft diet.  Diverticulitis versus colitis. Patient had completed her oral antibiotic outpatient. Continues to have abdominal pain.  CT shows evidence of ongoing presence of diverticulitis but no evidence of perforation or rupture. Admitted for concern for failed outpatient therapy. For now we will continue with the antibiotic and monitor for improvement.  Fibromyalgia and bipolar disorder. Home regimen includes Cymbalta  60 mg daily, Tegretol  200 mg daily, lorazepam  2 mg daily, Ambien  10 mg daily, Will continue most of the medications. Lorazepam  switched to as needed.  GERD. Continue PPI.  HTN. On amlodipine  10 mg daily which I will continue.  Chronic arthritis. Possible is up with orthopedic outpatient. PT OT consulted. Walker arranged.  Obesity Class 1 Body mass index is 33.67 kg/m.  Placing the pt at higher risk of poor outcomes.

## 2024-03-06 ENCOUNTER — Inpatient Hospital Stay (HOSPITAL_COMMUNITY)

## 2024-03-06 DIAGNOSIS — K5792 Diverticulitis of intestine, part unspecified, without perforation or abscess without bleeding: Secondary | ICD-10-CM | POA: Diagnosis not present

## 2024-03-06 LAB — CBC
HCT: 39.1 % (ref 36.0–46.0)
Hemoglobin: 12.9 g/dL (ref 12.0–15.0)
MCH: 31.2 pg (ref 26.0–34.0)
MCHC: 33 g/dL (ref 30.0–36.0)
MCV: 94.4 fL (ref 80.0–100.0)
Platelets: 147 K/uL — ABNORMAL LOW (ref 150–400)
RBC: 4.14 MIL/uL (ref 3.87–5.11)
RDW: 13.8 % (ref 11.5–15.5)
WBC: 5.6 K/uL (ref 4.0–10.5)
nRBC: 0 % (ref 0.0–0.2)

## 2024-03-06 LAB — BASIC METABOLIC PANEL WITH GFR
Anion gap: 10 (ref 5–15)
BUN: <5 mg/dL — ABNORMAL LOW (ref 8–23)
CO2: 25 mmol/L (ref 22–32)
Calcium: 8.9 mg/dL (ref 8.9–10.3)
Chloride: 105 mmol/L (ref 98–111)
Creatinine, Ser: 0.97 mg/dL (ref 0.44–1.00)
GFR, Estimated: >60 mL/min (ref >60)
Glucose, Bld: 87 mg/dL (ref 70–99)
Potassium: 3.7 mmol/L (ref 3.5–5.1)
Sodium: 140 mmol/L (ref 135–145)

## 2024-03-06 LAB — MAGNESIUM: Magnesium: 1.9 mg/dL (ref 1.7–2.4)

## 2024-03-06 MED ORDER — SORBITOL 70 % SOLN
30.0000 mL | Freq: Every day | Status: DC
  Administered 2024-03-06: 30 mL via ORAL
  Filled 2024-03-06 (×2): qty 30

## 2024-03-06 MED ORDER — SORBITOL 70 % SOLN
30.0000 mL | Freq: Two times a day (BID) | Status: DC
  Administered 2024-03-06 – 2024-03-09 (×5): 30 mL via ORAL
  Filled 2024-03-06 (×7): qty 30

## 2024-03-06 NOTE — Progress Notes (Addendum)
 [facility] Progress Note Patient: Pamela Graham Carel FMW:[id number] DOB: 05/31/1956  DOA: 03/04/2024 DOS: the patient was seen and examined on 03/06/2024  Brief Hospital Course: Patient with PMH of HTN, bipolar, Barrett's, migraine, fibromyalgia presents the hospital with complaints of abdominal pain. Reports that end of July she went for a colonoscopy. Since then she has some abdominal pain. Was treated for H. pylori infection. Presented to ED on 9/10 for complaints of abdominal pain and constipation. Went to see her primary GI on 9/12 who recommended CT abdomen which showed evidence of diverticulitis versus colitis.  Started on Cipro  and Flagyl  and full liquid diet. Present again to ED on 9/18 for complaints of abdominal constipation. Currently being treated for constipation.  Assessment and Plan: Severe constipation. Recurrent abdominal pain. Suspect that her pain is primarily coming from severe constipation based on the appearance. She reports spasmodic pain all over her abdomen primarily more on the right side. There is no evidence of ascending colon diverticulitis or colitis. Continue bowel regimen. Advance diet to soft diet.  Diverticulitis versus colitis. Patient had completed her oral antibiotic outpatient. Continues to have abdominal pain.  CT shows evidence of ongoing presence of diverticulitis but no evidence of perforation or rupture. Admitted for concern for failed outpatient therapy. For now we will continue with the antibiotic and monitor for improvement.  Fibromyalgia and bipolar disorder. Home regimen includes Cymbalta  60 mg daily, Tegretol  200 mg daily, lorazepam  2 mg daily, Ambien  10 mg daily, Will continue most of the medications. Lorazepam  switched to as needed.  GERD. Continue PPI.  HTN. On amlodipine  10 mg daily which I will continue.  Chronic arthritis. Possible is up with orthopedic outpatient. PT OT consulted. Walker arranged.   Obesity Class  1 Body mass index is 33.67 kg/m.  Placing the pt at higher risk of poor outcomes.   Subjective: No nausea no vomiting.  Abdominal pain resolved.  Physical Exam: Clear to auscultation. Bowel sound present.  Diffusely tender. No edema.  Data Reviewed: I have Reviewed nursing notes, Vitals, and Lab results. Since last encounter, pertinent lab results CBC and BMP   .   Disposition: Status is: Inpatient Remains inpatient appropriate because: Monitor for improvement in abdominal pain  enoxaparin  (LOVENOX ) injection 40 mg Start: 03/05/24 2200   Family Communication: No one at bedside Level of care: Med-Surg   Vitals:   03/05/24 2109 03/06/24 0549 03/06/24 0834 03/06/24 1652  BP: (!) 150/96 (!) 141/89 (!) 172/98 112/66  Pulse: 63 80 69 89  Resp: 18 18 18 17   Temp: 98 F (36.7 C) 98 F (36.7 C) 98 F (36.7 C)   TempSrc: Oral Oral    SpO2: 100% 99% 98% 100%  Weight:      Height:         Author: Yetta Blanch, MD 03/06/2024 5:45 PM  Please look on [email or web address] to find out who is on call.

## 2024-03-06 NOTE — Plan of Care (Signed)

## 2024-03-07 DIAGNOSIS — K5792 Diverticulitis of intestine, part unspecified, without perforation or abscess without bleeding: Secondary | ICD-10-CM | POA: Diagnosis not present

## 2024-03-07 LAB — BASIC METABOLIC PANEL WITH GFR
Anion gap: 9 (ref 5–15)
BUN: 6 mg/dL — ABNORMAL LOW (ref 8–23)
CO2: 23 mmol/L (ref 22–32)
Calcium: 8.6 mg/dL — ABNORMAL LOW (ref 8.9–10.3)
Chloride: 106 mmol/L (ref 98–111)
Creatinine, Ser: 0.85 mg/dL (ref 0.44–1.00)
GFR, Estimated: >60 mL/min (ref >60)
Glucose, Bld: 91 mg/dL (ref 70–99)
Potassium: 3.6 mmol/L (ref 3.5–5.1)
Sodium: 138 mmol/L (ref 135–145)

## 2024-03-07 LAB — CBC
HCT: 36.8 % (ref 36.0–46.0)
Hemoglobin: 12.1 g/dL (ref 12.0–15.0)
MCH: 31.3 pg (ref 26.0–34.0)
MCHC: 32.9 g/dL (ref 30.0–36.0)
MCV: 95.1 fL (ref 80.0–100.0)
Platelets: 143 K/uL — ABNORMAL LOW (ref 150–400)
RBC: 3.87 MIL/uL (ref 3.87–5.11)
RDW: 13.8 % (ref 11.5–15.5)
WBC: 6.1 K/uL (ref 4.0–10.5)
nRBC: 0 % (ref 0.0–0.2)

## 2024-03-07 LAB — MAGNESIUM: Magnesium: 1.9 mg/dL (ref 1.7–2.4)

## 2024-03-07 MED ORDER — FAMOTIDINE IN NACL 20-0.9 MG/50ML-% IV SOLN: 20.0000 mg | Freq: Two times a day (BID) | INTRAVENOUS | Status: DC

## 2024-03-07 MED ORDER — SENNOSIDES-DOCUSATE SODIUM 8.6-50 MG PO TABS
2.0000 | ORAL_TABLET | Freq: Every day | ORAL | Status: DC
  Administered 2024-03-07 – 2024-03-10 (×4): 2 via ORAL
  Filled 2024-03-07 (×4): qty 2

## 2024-03-07 MED ORDER — PEG 3350-KCL-NA BICARB-NACL 420 G PO SOLR
1000.0000 mL | Freq: Once | ORAL | Status: AC
  Administered 2024-03-07: 1000 mL via ORAL
  Filled 2024-03-07: qty 4000

## 2024-03-07 MED ORDER — DIPHENHYDRAMINE HCL 25 MG PO CAPS
25.0000 mg | ORAL_CAPSULE | Freq: Four times a day (QID) | ORAL | Status: DC | PRN
  Administered 2024-03-11: 25 mg via ORAL
  Filled 2024-03-07 (×2): qty 1

## 2024-03-07 MED ORDER — HYDROXYZINE HCL 25 MG PO TABS
25.0000 mg | ORAL_TABLET | Freq: Three times a day (TID) | ORAL | Status: DC | PRN
  Filled 2024-03-07: qty 1

## 2024-03-07 MED ORDER — FAMOTIDINE 20 MG PO TABS
20.0000 mg | ORAL_TABLET | Freq: Two times a day (BID) | ORAL | Status: DC
  Administered 2024-03-07 – 2024-03-15 (×17): 20 mg via ORAL
  Filled 2024-03-07 (×17): qty 1

## 2024-03-07 MED ORDER — SIMETHICONE 80 MG PO CHEW
80.0000 mg | CHEWABLE_TABLET | Freq: Once | ORAL | Status: AC
  Administered 2024-03-07: 80 mg via ORAL
  Filled 2024-03-07: qty 1

## 2024-03-07 MED ORDER — HYDROCORTISONE 1 % EX CREA
TOPICAL_CREAM | Freq: Three times a day (TID) | CUTANEOUS | Status: DC
  Administered 2024-03-14 (×2): 1 via TOPICAL
  Filled 2024-03-07 (×4): qty 28

## 2024-03-07 MED ORDER — MAGNESIUM CITRATE PO SOLN
1.0000 | Freq: Once | ORAL | Status: AC
  Administered 2024-03-07: 1 via ORAL
  Filled 2024-03-07: qty 296

## 2024-03-07 NOTE — Plan of Care (Signed)
  Problem: Clinical Measurements: Goal: Ability to maintain clinical measurements within normal limits will improve Outcome: Progressing Goal: Will remain free from infection Outcome: Progressing   Problem: Activity: Goal: Risk for activity intolerance will decrease Outcome: Progressing   Problem: Safety: Goal: Ability to remain free from injury will improve Outcome: Progressing   Problem: Skin Integrity: Goal: Risk for impaired skin integrity will decrease Outcome: Progressing

## 2024-03-07 NOTE — Plan of Care (Signed)

## 2024-03-07 NOTE — Progress Notes (Signed)
 [facility] Progress Note Patient: Pamela Graham FMW:[id number] DOB: [birth year]  DOA: 03/04/2024 DOS: the patient was seen and examined on 03/07/2024  Brief Hospital Course: Patient with PMH of HTN, bipolar, Barrett's, migraine, fibromyalgia presents the hospital with complaints of abdominal pain. Reports that end of July she went for a colonoscopy. Since then she has some abdominal pain. Was treated for H. pylori infection. Presented to ED on 9/10 for complaints of abdominal pain and constipation. Went to see her primary GI on 9/12 who recommended CT abdomen which showed evidence of diverticulitis versus colitis.  Started on Cipro  and Flagyl  and full liquid diet. Present again to ED on 9/18 for complaints of abdominal constipation. Currently being treated for constipation.  Assessment and Plan: Severe constipation. Recurrent abdominal pain. Suspect that her pain is primarily coming from severe constipation based on the appearance. She reports spasmodic pain all over her abdomen primarily more on the right side. There is no evidence of ascending colon diverticulitis or colitis. Continue bowel regimen.  Continue soft diet. Mag citrate ordered but does not appear to be effective.  GoLytely  ordered.  Diverticulitis versus colitis. Patient had completed her oral antibiotic outpatient. Continues to have abdominal pain.  CT shows evidence of ongoing presence of diverticulitis but no evidence of perforation or rupture. Admitted for concern for failed outpatient therapy. For now we will continue with the antibiotic and monitor for improvement. Not a candidate for aggressive enema therapy for her constipation.  Fibromyalgia and bipolar disorder. Home regimen includes Cymbalta  60 mg daily, Tegretol  200 mg daily, lorazepam  2 mg daily, Ambien  10 mg daily, Will continue most of the medications. Lorazepam  switched to as needed.  GERD. Continue PPI.  HTN. On amlodipine  10 mg daily which I  will continue.  Chronic arthritis. Possible is up with orthopedic outpatient. PT OT consulted. Walker arranged.  Obesity Class 1 Body mass index is 33.67 kg/m.  Placing the pt at higher risk of poor outcomes.   Rash. Patient reports developing a rash this morning.  While the rash is not as pronounced and does not appear typical for a drug-induced rash patient does have a history of penicillin allergy and therefore we will be holding ceftriaxone  and monitor. Benadryl  and Pepcid  ordered. Hydrocortisone  cream ordered. Monitor.   Subjective: Ongoing abdominal discomfort.  No nausea no vomiting.  Passing gas but no BM.  Physical Exam: Clear to auscultation. Bowel sound present.  Diffusely tender.  Unchanged. No edema.  Data Reviewed: I have Reviewed nursing notes, Vitals, and Lab results. Since last encounter, pertinent lab results CBC and BMP   .   Disposition: Status is: Inpatient Remains inpatient appropriate because: Diverticulitis, rash requiring therapy and monitoring and gentle treatment of severe constipation.  enoxaparin  (LOVENOX ) injection 40 mg Start: 03/05/24 2200   Family Communication: No one at bedside Level of care: Med-Surg   Vitals:   03/06/24 1652 03/06/24 2107 03/07/24 0431 03/07/24 0726  BP: 112/66 (!) 141/80 (!) 149/87 (!) 154/109  Pulse: 89 80 83 76  Resp: 17 18 18 19   Temp:  97.9 F (36.6 C) 98.3 F (36.8 C) 97.9 F (36.6 C)  TempSrc:  Oral Oral Oral  SpO2: 100% 100% 98% 96%  Weight:      Height:         Author: Yetta Blanch, MD 03/07/2024 6:36 PM  Please look on [email or web address] to find out who is on call.

## 2024-03-07 NOTE — Plan of Care (Signed)

## 2024-03-08 ENCOUNTER — Inpatient Hospital Stay (HOSPITAL_COMMUNITY)

## 2024-03-08 DIAGNOSIS — K5792 Diverticulitis of intestine, part unspecified, without perforation or abscess without bleeding: Secondary | ICD-10-CM | POA: Diagnosis not present

## 2024-03-08 LAB — BASIC METABOLIC PANEL WITH GFR
Anion gap: 9 (ref 5–15)
BUN: <5 mg/dL — ABNORMAL LOW (ref 8–23)
CO2: 25 mmol/L (ref 22–32)
Calcium: 8.9 mg/dL (ref 8.9–10.3)
Chloride: 103 mmol/L (ref 98–111)
Creatinine, Ser: 0.99 mg/dL (ref 0.44–1.00)
GFR, Estimated: >60 mL/min (ref >60)
Glucose, Bld: 94 mg/dL (ref 70–99)
Potassium: 4.4 mmol/L (ref 3.5–5.1)
Sodium: 137 mmol/L (ref 135–145)

## 2024-03-08 LAB — CBC
HCT: 40.1 % (ref 36.0–46.0)
Hemoglobin: 13.4 g/dL (ref 12.0–15.0)
MCH: 31.6 pg (ref 26.0–34.0)
MCHC: 33.4 g/dL (ref 30.0–36.0)
MCV: 94.6 fL (ref 80.0–100.0)
Platelets: 144 K/uL — ABNORMAL LOW (ref 150–400)
RBC: 4.24 MIL/uL (ref 3.87–5.11)
RDW: 13.8 % (ref 11.5–15.5)
WBC: 6.9 K/uL (ref 4.0–10.5)
nRBC: 0 % (ref 0.0–0.2)

## 2024-03-08 LAB — MAGNESIUM: Magnesium: 2.3 mg/dL (ref 1.7–2.4)

## 2024-03-08 MED ORDER — SIMETHICONE 80 MG PO CHEW
80.0000 mg | CHEWABLE_TABLET | Freq: Four times a day (QID) | ORAL | Status: DC
Start: 2024-03-08 — End: 2024-03-15
  Administered 2024-03-08 – 2024-03-15 (×26): 80 mg via ORAL
  Filled 2024-03-08 (×26): qty 1

## 2024-03-08 MED ORDER — POLYETHYLENE GLYCOL 3350 17 G PO PACK
17.0000 g | PACK | Freq: Two times a day (BID) | ORAL | Status: DC
Start: 2024-03-08 — End: 2024-03-09
  Administered 2024-03-08 – 2024-03-09 (×2): 17 g via ORAL
  Filled 2024-03-08 (×2): qty 1

## 2024-03-08 NOTE — Progress Notes (Signed)
 [facility] Progress Note Patient: Pamela Graham FMW:[id number] DOB: [birth year]  DOA: 03/04/2024 DOS: the patient was seen and examined on 03/08/2024  Brief Hospital Course: Patient with PMH of HTN, bipolar, Barrett's, migraine, fibromyalgia presents the hospital with complaints of abdominal pain. Reports that end of July she went for a colonoscopy. Since then she has some abdominal pain. Was treated for H. pylori infection. Presented to ED on 9/10 for complaints of abdominal pain and constipation. Went to see her primary GI on 9/12 who recommended CT abdomen which showed evidence of diverticulitis versus colitis.  Started on Cipro  and Flagyl  and full liquid diet. Present again to ED on 9/18 for complaints of abdominal constipation. Currently being treated for constipation.  Assessment and Plan: Severe constipation. Recurrent abdominal pain. Suspect that her pain is primarily coming from severe constipation based on the appearance. She reports spasmodic pain all over her abdomen primarily more on the right side. There is no evidence of ascending colon diverticulitis or colitis. Continue bowel regimen.  Continue soft diet. Mag citrate ordered but does not appear to be effective.  GoLytely  ordered.  Will monitor response.  May require GI consultation.  Diverticulitis versus colitis. Patient had completed her oral antibiotic outpatient. Continues to have abdominal pain.  CT shows evidence of ongoing presence of diverticulitis but no evidence of perforation or rupture. Admitted for concern for failed outpatient therapy. For now we will continue with the antibiotic and monitor for improvement. Not a candidate for aggressive enema therapy for her constipation.  Fibromyalgia and bipolar disorder. Home regimen includes Cymbalta  60 mg daily, Tegretol  200 mg daily, lorazepam  2 mg daily, Ambien  10 mg daily, Will continue most of the medications. Lorazepam  switched to as  needed.  GERD. Continue PPI.  HTN. On amlodipine  10 mg daily which I will continue.  Chronic arthritis. Possible is up with orthopedic outpatient. PT OT consulted. Walker arranged.  Obesity Class 1 Body mass index is 33.67 kg/m.  Placing the pt at higher risk of poor outcomes.   Rash. Patient reports developing a rash this morning.  While the rash is not as pronounced and does not appear typical for a drug-induced rash patient does have a history of penicillin allergy and therefore we will be holding ceftriaxone  and monitor. Benadryl  and Pepcid  ordered. Hydrocortisone  cream ordered. Monitor.  Now resolved.   Subjective: No nausea no vomiting.  Small BM.  Still severe abdominal pain reported.  Oral intake adequate.  Physical Exam: Bowel sound present Abdomen Still diffusely tender No edema.  Data Reviewed: I have Reviewed nursing notes, Vitals, and Lab results. Since last encounter, pertinent lab results CBC BMP   .  Check BMP Disposition: Status is: Inpatient Remains inpatient appropriate because: Monitor for improvement in constipation  enoxaparin  (LOVENOX ) injection 40 mg Start: 03/05/24 2200   Family Communication: No one at bedside Level of care: Med-Surg   Vitals:   03/07/24 2215 03/08/24 0413 03/08/24 0759 03/08/24 1624  BP: (!) 170/112 136/77 123/82 (!) 142/100  Pulse: 89 93 95 100  Resp:  18 18 18   Temp:  98 F (36.7 C) 97.9 F (36.6 C) 98.7 F (37.1 C)  TempSrc:  Oral Oral   SpO2: 96% 100% 94% 99%  Weight:      Height:         Author: Yetta Blanch, MD 03/08/2024 7:17 PM  Please look on [email or web address] to find out who is on call.

## 2024-03-08 NOTE — Progress Notes (Signed)
 Mobility Specialist Progress Note:   03/08/24 0954  Mobility  Activity Ambulated with assistance (In hallway)  Level of Assistance Standby assist, set-up cues, supervision of patient - no hands on  Assistive Device Front wheel walker  Distance Ambulated (ft) 510 ft  Activity Response Tolerated well  Mobility Referral Yes  Mobility visit 1 Mobility  Mobility Specialist Start Time (ACUTE ONLY) [id number]  Mobility Specialist Stop Time (ACUTE ONLY) 0953  Mobility Specialist Time Calculation (min) (ACUTE ONLY) 14 min   Received pt in bed and agreeable to mobility. No physical assistance needed. C/o abdominal pain, otherwise tolerated well. Returned to room without fault. Left pt EOB. Personal belongings and call light within reach. All needs met.  Lavanda Pollack Mobility Specialist  Please contact via [employer] or  Rehab Office [phone]

## 2024-03-08 NOTE — Care Management Important Message (Signed)
 Important Message  Patient Details  Name: Pamela Graham MRN: [id number] Date of Birth: January 06, 1956   Important Message Given:  Yes - Medicare IM     Jon Cruel 03/08/2024, 2:49 PM

## 2024-03-09 ENCOUNTER — Encounter (HOSPITAL_COMMUNITY): Payer: Self-pay | Admitting: Internal Medicine

## 2024-03-09 DIAGNOSIS — K5792 Diverticulitis of intestine, part unspecified, without perforation or abscess without bleeding: Secondary | ICD-10-CM | POA: Diagnosis not present

## 2024-03-09 LAB — CBC
HCT: 38 % (ref 36.0–46.0)
Hemoglobin: 12.4 g/dL (ref 12.0–15.0)
MCH: 31.6 pg (ref 26.0–34.0)
MCHC: 32.6 g/dL (ref 30.0–36.0)
MCV: 96.9 fL (ref 80.0–100.0)
Platelets: 144 K/uL — ABNORMAL LOW (ref 150–400)
RBC: 3.92 MIL/uL (ref 3.87–5.11)
RDW: 13.8 % (ref 11.5–15.5)
WBC: 6.6 K/uL (ref 4.0–10.5)
nRBC: 0 % (ref 0.0–0.2)

## 2024-03-09 LAB — BASIC METABOLIC PANEL WITH GFR
Anion gap: 8 (ref 5–15)
BUN: 7 mg/dL — ABNORMAL LOW (ref 8–23)
CO2: 27 mmol/L (ref 22–32)
Calcium: 8.6 mg/dL — ABNORMAL LOW (ref 8.9–10.3)
Chloride: 101 mmol/L (ref 98–111)
Creatinine, Ser: 1.11 mg/dL — ABNORMAL HIGH (ref 0.44–1.00)
GFR, Estimated: 55 mL/min — ABNORMAL LOW (ref >60)
Glucose, Bld: 107 mg/dL — ABNORMAL HIGH (ref 70–99)
Potassium: 5 mmol/L (ref 3.5–5.1)
Sodium: 136 mmol/L (ref 135–145)

## 2024-03-09 LAB — MAGNESIUM: Magnesium: 2.3 mg/dL (ref 1.7–2.4)

## 2024-03-09 MED ORDER — PEG 3350-KCL-NA BICARB-NACL 420 G PO SOLR
2000.0000 mL | Freq: Once | ORAL | Status: DC
  Filled 2024-03-09: qty 4000

## 2024-03-09 MED ORDER — SORBITOL 70 % SOLN
60.0000 mL | Freq: Two times a day (BID) | Status: DC
  Administered 2024-03-09 – 2024-03-11 (×4): 60 mL via ORAL
  Filled 2024-03-09 (×5): qty 60

## 2024-03-09 MED ORDER — POLYETHYLENE GLYCOL 3350 17 G PO PACK
238.0000 g | PACK | Freq: Once | ORAL | Status: AC
  Administered 2024-03-09: 238 g via ORAL
  Filled 2024-03-09: qty 14

## 2024-03-09 NOTE — Progress Notes (Signed)
 PO zofran  given per pt request

## 2024-03-09 NOTE — Progress Notes (Signed)
 [facility] Progress Note Patient: Pamela Graham FMW:[id number] DOB: 1956-02-05  DOA: 03/04/2024 DOS: the patient was seen and examined on 03/09/2024  Brief Hospital Course: Patient with PMH of HTN, bipolar, Barrett's, migraine, fibromyalgia presents the hospital with complaints of abdominal pain. Reports that end of July she went for a colonoscopy. Since then she has some abdominal pain. Was treated for H. pylori infection. Presented to ED on 9/10 for complaints of abdominal pain and constipation. Went to see her primary GI on 9/12 who recommended CT abdomen which showed evidence of diverticulitis versus colitis.  Started on Cipro  and Flagyl  and full liquid diet. Present again to ED on 9/18 for complaints of abdominal constipation. Currently being treated for constipation.  Assessment and Plan: Severe constipation. Recurrent abdominal pain. Suspect that her pain is primarily coming from severe constipation based on the appearance. She reports spasmodic pain all over her abdomen primarily more on the right side. There is no evidence of ascending colon diverticulitis or colitis. Continue bowel regimen.  Continue soft diet. Mag citrate ordered but does not appear to be effective.  GoLytely  ordered unable to tolerate due to nausea. MiraLAX  bowel prep was given on 9/23 so far no BM.  Continue sorbitol . Repeat x-ray tomorrow.  May require tapwater enema.  May require GI consultation.  Diverticulitis versus colitis. Patient had completed her oral antibiotic outpatient. Continues to have abdominal pain.  CT shows evidence of ongoing presence of diverticulitis but no evidence of perforation or rupture. Admitted for concern for failed outpatient therapy. For now we will continue with the antibiotic and monitor for improvement. Briefly discussed with GI, not a candidate for aggressive enema therapy for her constipation.  Fibromyalgia and bipolar disorder. Home regimen includes Cymbalta  60 mg  daily, Tegretol  200 mg daily, lorazepam  2 mg daily, Ambien  10 mg daily, Will continue most of the medications. Lorazepam  switched to as needed.  GERD. Continue PPI.  HTN. On amlodipine  10 mg daily which I will continue.  Chronic arthritis. Possible is up with orthopedic outpatient. PT OT consulted. Walker arranged.  Obesity Class 1 Body mass index is 33.67 kg/m.  Placing the pt at higher risk of poor outcomes.   Rash. Patient reports developing a rash this morning.  While the rash is not as pronounced and does not appear typical for a drug-induced rash patient does have a history of penicillin allergy and therefore we will be holding ceftriaxone  and monitor. Benadryl  and Pepcid  ordered. Hydrocortisone  cream ordered. Monitor.  Now resolved.   Subjective: No nausea no vomiting the time of my evaluation.  Later in the day reported nauseous with nurse.  Abdominal pain unchanged but improved with Bentyl .  Passing gas but no BM.  Minimal oral intake today due to feeling full.  Physical Exam: Clear to auscultation. S1-S2 present Bowel sound present.  Diffusely tender. Trace edema.  Data Reviewed: I have Reviewed nursing notes, Vitals, and Lab results. Since last encounter, pertinent lab results CBC and BMP   . I have ordered test including CBC and BMP  . I have ordered imaging x-ray abdomen  .    Disposition: Status is: Inpatient Remains inpatient appropriate because: Monitor for improvement in severe constipation  enoxaparin  (LOVENOX ) injection 40 mg Start: 03/05/24 2200   Family Communication: Multiple family members at bedside. Level of care: Med-Surg   Vitals:   03/09/24 0826 03/09/24 0937 03/09/24 1741 03/09/24 1938  BP: 130/73 130/73 (!) 133/90 123/81  Pulse: 84  80 92  Resp: 16  15  16  Temp: 98.3 F (36.8 C)  98.2 F (36.8 C) 98.3 F (36.8 C)  TempSrc: Oral  Oral Oral  SpO2: 99%  98% 98%  Weight:      Height:         Author: Yetta Blanch, MD 03/09/2024  8:31 PM  Please look on [email or web address] to find out who is on call.

## 2024-03-09 NOTE — Progress Notes (Signed)
 MD came to room and spoke with pt and her family about her care. All questions were answered. MD will order an Xray tomorrow as well as an enema if pt still not had BM

## 2024-03-09 NOTE — Progress Notes (Signed)
Pt given prune juice per request.

## 2024-03-09 NOTE — Plan of Care (Signed)

## 2024-03-09 NOTE — Progress Notes (Signed)
 Physical Therapy Treatment Patient Details Name: Pamela Graham MRN: [id number] DOB: [birth year] Today's Date: 03/09/2024   History of Present Illness 68 yo F adm 03/04/24 with Abdominal pain, diverticulitis. PMHx: HTN, fibromyalgia, bipolar disorder    PT Comments  Pt resting in bed on arrival, agreeable to session with encouragement and education on benefits and importance of mobility. Pt demonstrating gait with RW for support at grossly supervision level, however benefiting from light cues for safety as pt with tendency to close eyes due to abdominal pain with increased R/L drift in hall. Continued education on importance of time up OOB and upright sitting with pt verbalizing understanding. Pt continues to benefit from skilled PT services to progress toward functional mobility goals.     If plan is discharge home, recommend the following: Assistance with cooking/housework   Can travel by Training and development officer (2 wheels)    Recommendations for Other Services       Precautions / Restrictions Precautions Precautions: Fall Recall of Precautions/Restrictions: Intact Restrictions Weight Bearing Restrictions Per Provider Order: No     Mobility  Bed Mobility Overal bed mobility: Modified Independent                  Transfers Overall transfer level: Modified independent                      Ambulation/Gait Ambulation/Gait assistance: Supervision Gait Distance (Feet): 400 Feet Assistive device: Rolling walker (2 wheels) Gait Pattern/deviations: Step-through pattern, Decreased stride length       General Gait Details: pt with reliance on RW with gait, cues for direction, no overt LOB noted, some R/L drift as pt closing eeys due to pain   Stairs             Wheelchair Mobility     Tilt Bed    Modified Rankin (Stroke Patients Only)       Balance Overall balance assessment: History of Falls                                           Communication Communication Communication: No apparent difficulties  Cognition Arousal: Alert Behavior During Therapy: WFL for tasks assessed/performed   PT - Cognitive impairments: Safety/Judgement                         Following commands: Intact      Cueing Cueing Techniques: Verbal cues  Exercises      General Comments        Pertinent Vitals/Pain Pain Assessment Pain Assessment: Faces Faces Pain Scale: Hurts even more Pain Location: abdomen Pain Descriptors / Indicators: Grimacing, Guarding Pain Intervention(s): Monitored during session, Limited activity within patient's tolerance    Home Living                          Prior Function            PT Goals (current goals can now be found in the care plan section) Acute Rehab PT Goals Patient Stated Goal: return home and not fall PT Goal Formulation: With patient Time For Goal Achievement: 03/19/24 Progress towards PT goals: Progressing toward goals    Frequency    Min 1X/week      PT Plan  Co-evaluation              AM-PAC PT 6 Clicks Mobility   Outcome Measure  Help needed turning from your back to your side while in a flat bed without using bedrails?: None Help needed moving from lying on your back to sitting on the side of a flat bed without using bedrails?: None Help needed moving to and from a bed to a chair (including a wheelchair)?: None Help needed standing up from a chair using your arms (e.g., wheelchair or bedside chair)?: None Help needed to walk in hospital room?: A Little Help needed climbing 3-5 steps with a railing? : A Little 6 Click Score: 22    End of Session   Activity Tolerance: Patient tolerated treatment well Patient left: with call bell/phone within reach;in bed;with family/visitor present (seated up EOB) Nurse Communication: Mobility status PT Visit Diagnosis: Other abnormalities of gait  and mobility (R26.89);Difficulty in walking, not elsewhere classified (R26.2)     Time: 8387-8374 PT Time Calculation (min) (ACUTE ONLY): 13 min  Charges:    $Therapeutic Exercise: 8-22 mins PT General Charges $$ ACUTE PT VISIT: 1 Visit                     Pamela Graham R. PTA Acute Rehabilitation Services Office: [phone]   Pamela Graham 03/09/2024, 4:33 PM

## 2024-03-10 ENCOUNTER — Inpatient Hospital Stay (HOSPITAL_COMMUNITY)

## 2024-03-10 DIAGNOSIS — R1030 Lower abdominal pain, unspecified: Secondary | ICD-10-CM

## 2024-03-10 DIAGNOSIS — K5792 Diverticulitis of intestine, part unspecified, without perforation or abscess without bleeding: Secondary | ICD-10-CM | POA: Diagnosis not present

## 2024-03-10 DIAGNOSIS — Z860100 Personal history of colon polyps, unspecified: Secondary | ICD-10-CM

## 2024-03-10 DIAGNOSIS — R933 Abnormal findings on diagnostic imaging of other parts of digestive tract: Secondary | ICD-10-CM | POA: Diagnosis not present

## 2024-03-10 DIAGNOSIS — K5900 Constipation, unspecified: Secondary | ICD-10-CM | POA: Diagnosis not present

## 2024-03-10 DIAGNOSIS — K219 Gastro-esophageal reflux disease without esophagitis: Secondary | ICD-10-CM

## 2024-03-10 LAB — BASIC METABOLIC PANEL WITH GFR
Anion gap: 9 (ref 5–15)
BUN: 8 mg/dL (ref 8–23)
CO2: 25 mmol/L (ref 22–32)
Calcium: 8.7 mg/dL — ABNORMAL LOW (ref 8.9–10.3)
Chloride: 104 mmol/L (ref 98–111)
Creatinine, Ser: 1.01 mg/dL — ABNORMAL HIGH (ref 0.44–1.00)
GFR, Estimated: >60 mL/min (ref >60)
Glucose, Bld: 103 mg/dL — ABNORMAL HIGH (ref 70–99)
Potassium: 4 mmol/L (ref 3.5–5.1)
Sodium: 138 mmol/L (ref 135–145)

## 2024-03-10 LAB — CBC
HCT: 39.2 % (ref 36.0–46.0)
Hemoglobin: 13.2 g/dL (ref 12.0–15.0)
MCH: 31.7 pg (ref 26.0–34.0)
MCHC: 33.7 g/dL (ref 30.0–36.0)
MCV: 94 fL (ref 80.0–100.0)
Platelets: 199 K/uL (ref 150–400)
RBC: 4.17 MIL/uL (ref 3.87–5.11)
RDW: 13.7 % (ref 11.5–15.5)
WBC: 5.5 K/uL (ref 4.0–10.5)
nRBC: 0 % (ref 0.0–0.2)

## 2024-03-10 LAB — MAGNESIUM: Magnesium: 2.1 mg/dL (ref 1.7–2.4)

## 2024-03-10 MED ORDER — NA SULFATE-K SULFATE-MG SULF 17.5-3.13-1.6 GM/177ML PO SOLN
0.5000 | Freq: Once | ORAL | Status: AC
  Administered 2024-03-10: 177 mL via ORAL
  Filled 2024-03-10: qty 1

## 2024-03-10 MED ORDER — SODIUM CHLORIDE 0.9 % IV SOLN: INTRAVENOUS | Status: AC

## 2024-03-10 MED ORDER — NA SULFATE-K SULFATE-MG SULF 17.5-3.13-1.6 GM/177ML PO SOLN: 0.5000 | Freq: Once | ORAL | Status: DC

## 2024-03-10 NOTE — Plan of Care (Signed)

## 2024-03-10 NOTE — Progress Notes (Signed)
 [facility]  PROGRESS NOTE  Pamela Graham FMW:[id number] DOB: [birth year] DOA: 03/04/2024 PCP: [name] LABOR, MD   Brief HPI:    Patient with PMH of HTN, bipolar, Barrett's, migraine, fibromyalgia presents the hospital with complaints of abdominal pain. Reports that end of July she went for a colonoscopy. Since then she has some abdominal pain. Was treated for H. pylori infection. Presented to ED on 9/10 for complaints of abdominal pain and constipation. Went to see her primary GI on 9/12 who recommended CT abdomen which showed evidence of diverticulitis versus colitis.  Started on Cipro  and Flagyl  and full liquid diet. Present again to ED on 9/18 for complaints of abdominal constipation. Currently being treated for constipation.     Assessment/Plan:   Severe constipation. Recurrent abdominal pain. Suspect that her pain is primarily coming from severe constipation based on the appearance. She reports spasmodic pain all over her abdomen primarily more on the right side. There is no evidence of ascending colon diverticulitis or colitis. Continue bowel regimen.  Continue soft diet. Mag citrate ordered but does not appear to be effective.  GoLytely  ordered unable to tolerate due to nausea. MiraLAX  bowel prep was given on 9/23 so far no BM.  Continue sorbitol . X-ray abdomen showed nonobstructive bowel gas pattern, persistent moderate colonic stool burden. -Will consult gastroenterology for further recommendations   Diverticulitis versus colitis. Patient had completed her oral antibiotic outpatient. Continues to have abdominal pain.  CT shows evidence of ongoing presence of diverticulitis but no evidence of perforation or rupture. Admitted for concern for failed outpatient therapy. For now we will continue with the antibiotic and monitor for improvement.    Fibromyalgia and bipolar disorder. Home regimen includes Cymbalta  60 mg daily, Tegretol  200 mg daily, lorazepam  2 mg  daily, Ambien  10 mg daily, Will continue most of the medications. Lorazepam  switched to as needed.   GERD. Continue PPI.   HTN. On amlodipine  10 mg daily    Chronic arthritis. Possible is up with orthopedic outpatient. PT OT consulted. Walker arranged.   Obesity Class 1 Body mass index is 33.67 kg/m.  Placing the pt at higher risk of poor outcomes.    Rash. Patient  developed a rash on 9/23 She was getting ceftriaxone  which was stopped as patient has penicillin allergy -Rash seems to have resolved    Medications     amLODipine   10 mg Oral Daily   carbamazepine   200 mg Oral Daily   dicyclomine   10 mg Oral TID AC   DULoxetine   60 mg Oral Daily   enoxaparin  (LOVENOX ) injection  40 mg Subcutaneous Q24H   famotidine   20 mg Oral BID   hydrocortisone  cream   Topical TID   pantoprazole   40 mg Oral Daily   senna-docusate  2 tablet Oral QHS   simethicone   80 mg Oral QID   sorbitol   60 mL Oral BID     Data Reviewed:   CBG:  No results for input(s): GLUCAP in the last 168 hours.  SpO2: 97 %    Vitals:   03/09/24 0937 03/09/24 1741 03/09/24 1938 03/10/24 0543  BP: 130/73 (!) 133/90 123/81 (!) 147/104  Pulse:  80 92 91  Resp:  15 16 18   Temp:  98.2 F (36.8 C) 98.3 F (36.8 C) 98.3 F (36.8 C)  TempSrc:  Oral Oral Oral  SpO2:  98% 98% 97%  Weight:      Height:          Data Reviewed:  Basic Metabolic Panel: Recent Labs  Lab 03/05/24 0804 03/06/24 0539 03/07/24 0332 03/08/24 0548 03/09/24 0321 03/10/24 0308  NA 139 140 138 137 136 138  K 3.1* 3.7 3.6 4.4 5.0 4.0  CL 105 105 106 103 101 104  CO2 23 25 23 25 27 25   GLUCOSE 128* 87 91 94 107* 103*  BUN 6* <5* 6* <5* 7* 8  CREATININE 0.88 0.97 0.85 0.99 1.11* 1.01*  CALCIUM 9.2 8.9 8.6* 8.9 8.6* 8.7*  MG 1.9 1.9 1.9 2.3 2.3 2.1  PHOS 3.7  --   --   --   --   --     CBC: Recent Labs  Lab 03/06/24 0539 03/07/24 0332 03/08/24 0548 03/09/24 0321 03/10/24 0308  WBC 5.6 6.1 6.9 6.6 5.5   HGB 12.9 12.1 13.4 12.4 13.2  HCT 39.1 36.8 40.1 38.0 39.2  MCV 94.4 95.1 94.6 96.9 94.0  PLT 147* 143* 144* 144* 199    LFT Recent Labs  Lab 03/04/24 1653  AST 20  ALT 14  ALKPHOS 65  BILITOT 0.6  PROT 7.3  ALBUMIN 4.2     Antibiotics: Anti-infectives (From admission, onward)    Start     Dose/Rate Route Frequency Ordered Stop   03/05/24 0330  cefTRIAXone  (ROCEPHIN ) 2 g in sodium chloride  0.9 % 100 mL IVPB  Status:  Discontinued        2 g 200 mL/hr over 30 Minutes Intravenous Every 24 hours 03/05/24 0322 03/07/24 1303   03/05/24 0330  metroNIDAZOLE  (FLAGYL ) IVPB 500 mg        500 mg 100 mL/hr over 60 Minutes Intravenous Every 12 hours 03/05/24 0322     03/05/24 0245  metroNIDAZOLE  (FLAGYL ) IVPB 500 mg  Status:  Discontinued        500 mg 100 mL/hr over 60 Minutes Intravenous  Once 03/05/24 0240 03/05/24 0337   03/05/24 0245  cefTRIAXone  (ROCEPHIN ) 1 g in sodium chloride  0.9 % 100 mL IVPB  Status:  Discontinued        1 g 200 mL/hr over 30 Minutes Intravenous  Once 03/05/24 0240 03/05/24 9662        DVT prophylaxis: Lovenox   Code Status: Full code  Family Communication: Discussed with patient's daughter at bedside   CONSULTS none   Subjective   Complains of constipation, passing very minimal gas.   Objective    Physical Examination:   General-appears in no acute distress Heart-S1-S2, regular, no murmur auscultated Lungs-clear to auscultation bilaterally, no wheezing or crackles auscultated Abdomen-soft, distended, mild generalized tenderness to palpation Extremities-no edema in the lower extremities Neuro-alert, oriented x3, no focal deficit noted   Status is: Inpatient:             [name]   [facility] If 7PM-7AM, please contact night-coverage at [email or web address], Office  [phone]   03/10/2024, 9:01 AM  LOS: 5 days

## 2024-03-10 NOTE — Consult Note (Addendum)
 Referring Provider: Dr. Drusilla Primary Care Physician:  [name] LABOR, MD Primary Gastroenterologist:  [facility], unassigned   Reason for Consultation: Diverticulitis, constipation  HPI: Pamela Graham is a 68 y.o. female with a past medical history of hypertension, fibromyalgia, bipolar disorder, migraine headaches, DDD, GERD, H. pylori, Barrett's esophagus, colon polyps and diverticulitis.  She developed worsening right lower lower abdominal pain with N/V and constipation and presented to the ED 02/25/2024. CTAP showed long segment colon wall thickening consistent with sigmoid diverticulitis vs colitis with fecal retention in the proximal colon. She received Rocephin  and Flagyl  in the ED and was discharged on Cipro  and Flagyl  x 7 days. She was seen by her PCP 9/12, 2 days on Cipro  and Flagyl  and reported having difficulty completing this regimen and was encouraged to complete it.  She presented to the ED 03/05/2024 with persistent right lower abdominal pain and nausea. CTAP showed persistent segmental wall thickening to the rectosigmoid colon compatible with diverticulitis versus colitis without perforation or abscess and marked fecal retention in the proximal colon without obstruction or ileus.  Hepatic steatosis also noted.  Admission labs showed a normal CBC hypokalemia with a potassium level of 3.1 which corrected after KCl replacement administered. She was started on IV Cipro  and Flagyl .  She has not had a bowel movement since admission.  A GI consult was requested for further evaluation regarding persistent diverticulitis and constipation.  She has a history of dysphagia and colon polyps, followed by GI at [facility]. She underwent an EGD 01/06/2024 which showed a normal esophagus which was empirically dilated.  There was evidence of gastritis and biopsy confirmed H. pylori which was treated with quadruple therapy. The colonoscopy identified one 15 mm polyp removed from the sigmoid, path  report consistent with a tubular adenoma. No evidence of colitis.  A repeat colonoscopy in 3 years was recommended.  She reported completing 4 colonoscopies in her lifetime.  Her first colonoscopy at the age of 73 identified 7 polyps removed from the colon and she underwent a colonoscopy once yearly for few years.  No known family history of colorectal cancer or IBD.  He stated every since her EGD and colonoscopy her GI tract has not been right.  She continues to have nausea with decreased appetite.  She feels as if food sits to the top of her stomach.  She continues to have intermittent dysphagia, food briefly sits in the upper esophagus before passing, does not occur daily.  She has not passed a normal good bowel movement x 2 months.  She endorses passing small reason bits of stool and sometimes passes brown fluid with mucus.  No BM since admission.  She is passing gas per the rectum.  She takes Colace and MiraLAX  at home.  MiraLAX  was continued during this admission which was ineffective therefore was discontinued.  She received senna 2 tabs last night without results. On Sorbitol  twice daily.  She stated her RLQ pain is stable, no worse than when she was admitted but endorses having progressive abdominal gas bloat.  She is on dicyclomine  10 mg 3 times daily.  She last took oxycodone  5 mg at 9 AM.  PRIOR IMAGING:  CTAP W IV 02/2024 Long segment sigmoid colonic wall thickening, compatible with  sigmoid diverticulitis or colitis. No perforation, fluid collection, or abscess. Marked fecal retention within the more proximal colon, consistent with constipation. No bowel obstruction or ileus. Hepatic steatosis. Small hiatal hernia. Aortic Atherosclerosis.   GI PROCEDURES:  Colonoscopy  01/08/2024 - One 15 mm polyp in the sigmoid colon, removed. TA on biopsy, negative for high grade dysplasia. Repeat colonoscopy in 3 years.   EGD 01/06/2024 - No endoscopic esophageal abnormality to explain patient' s  dysphagia. Esophagus dilated. Dilated. Chronic gastritis, characterized by erythema and granularity. Normal examined duodenum. Pathology showed active chronic H. Pylori associated gastritis, positive for H.pylori.   Past Medical History:  Diagnosis Date   Barrett's esophagus    DDD (degenerative disc disease), lumbar    Fibromyalgia    Gastric ulcer    Hypertension    Migraines     Past Surgical History:  Procedure Laterality Date   ABDOMINAL HYSTERECTOMY     COLONOSCOPY     UPPER GI ENDOSCOPY      Prior to Admission medications   Medication Sig Start Date End Date Taking? Authorizing Provider  amLODipine  (NORVASC ) 10 MG tablet Take 10 mg by mouth daily.   Yes [provider]  carbamazepine  (TEGRETOL  XR) 200 MG 12 hr tablet Take 200 mg by mouth 2 (two) times daily.   Yes [provider]  cyclobenzaprine  (FLEXERIL ) 10 MG tablet Take 10 mg by mouth 2 (two) times daily as needed for muscle spasms.   Yes [provider]  DULoxetine  (CYMBALTA ) 60 MG capsule Take 60 mg by mouth daily. 02/20/24  Yes [provider]  fluticasone (FLONASE) 50 MCG/ACT nasal spray Place 1 spray into both nostrils daily as needed for allergies. 07/01/22  Yes [provider]  Lactulose  20 GM/30ML SOLN Take 30 mLs (20 g total) by mouth daily as needed (constipation). 02/25/24  Yes Haviland, Julie, MD  loratadine (CLARITIN) 10 MG tablet Take 10 mg by mouth daily as needed for allergies or rhinitis.   Yes [provider]  LOREEV XR  2 MG CS24 Take 1 capsule by mouth daily. 02/20/24  Yes [provider]  naloxone (NARCAN) nasal spray 4 mg/0.1 mL Place 1 spray into the nose. 05/08/23  Yes [provider]  omeprazole (PRILOSEC) 40 MG capsule Take 40 mg by mouth daily before breakfast. 11/20/18  Yes [provider]  ondansetron  (ZOFRAN ) 4 MG tablet Take 4 mg by mouth every 8 (eight) hours as needed. 03/04/24  Yes [provider]  Propylene  Glycol, PF, (SYSTANE COMPLETE PF) 0.6 % SOLN Apply 2 drops to eye in the morning and at bedtime.   Yes [provider]  zolpidem  (AMBIEN ) 10 MG tablet Take 10 mg by mouth at bedtime as needed. 02/20/24  Yes [provider]    Current Facility-Administered Medications  Medication Dose Route Frequency Provider Last Rate Last Admin   acetaminophen  (TYLENOL ) tablet 650 mg  650 mg Oral Q6H PRN Hall, Carole N, DO   650 mg at 03/08/24 0845   amLODipine  (NORVASC ) tablet 10 mg  10 mg Oral Daily Patel, Pranav M, MD   10 mg at 03/10/24 0848   carbamazepine  (TEGRETOL  XR) 12 hr tablet 200 mg  200 mg Oral Daily Patel, Pranav M, MD   200 mg at 03/10/24 9152   dicyclomine  (BENTYL ) capsule 10 mg  10 mg Oral TID AC Patel, Pranav M, MD   10 mg at 03/10/24 1215   diphenhydrAMINE  (BENADRYL ) capsule 25 mg  25 mg Oral Q6H PRN Patel, Pranav M, MD       DULoxetine  (CYMBALTA ) DR capsule 60 mg  60 mg Oral Daily Patel, Pranav M, MD   60 mg at 03/10/24 0848   enoxaparin  (LOVENOX ) injection 40 mg  40 mg Subcutaneous Q24H Patel, Pranav M, MD   40 mg at 03/09/24 2255   famotidine  (PEPCID ) tablet 20 mg  20 mg Oral BID Patel, Pranav M, MD   20 mg at 03/10/24 0848   hydrocortisone  cream 1 %   Topical TID Patel, Pranav M, MD   Given at 03/10/24 9147   LORazepam  (ATIVAN ) tablet 0.5 mg  0.5 mg Oral Q4H PRN Patel, Pranav M, MD   0.5 mg at 03/08/24 0242   metroNIDAZOLE  (FLAGYL ) IVPB 500 mg  500 mg Intravenous Q12H Shona Terry SAILOR, DO 100 mL/hr at 03/10/24 0855 500 mg at 03/10/24 0855   morphine  (PF) 2 MG/ML injection 2 mg  2 mg Intravenous Q4H PRN Patel, Pranav M, MD   2 mg at 03/08/24 1159   ondansetron  (ZOFRAN ) tablet 4 mg  4 mg Oral Q6H PRN Patel, Pranav M, MD   4 mg at 03/09/24 1023   Or   ondansetron  (ZOFRAN ) injection 4 mg  4 mg Intravenous Q6H PRN Patel, Pranav M, MD   4 mg at 03/10/24 9095   oxyCODONE  (Oxy IR/ROXICODONE ) immediate release tablet 5 mg  5 mg Oral Q6H PRN Shona Terry N, DO   5 mg at 03/10/24 9093    pantoprazole  (PROTONIX ) EC tablet 40 mg  40 mg Oral Daily Patel, Pranav M, MD   40 mg at 03/10/24 0847   senna-docusate (Senokot-S) tablet 2 tablet  2 tablet Oral QHS Patel, Pranav M, MD   2 tablet at 03/09/24 2250   simethicone  (MYLICON) chewable tablet 80 mg  80 mg Oral QID Patel, Pranav M, MD   80 mg at 03/10/24 0847   sorbitol  70 % solution 60 mL  60 mL Oral BID Patel, Pranav M, MD   60 mL at 03/10/24 0850   zolpidem  (AMBIEN ) tablet 5 mg  5 mg Oral QHS PRN Patel, Pranav M, MD   5 mg at 03/09/24 2302    Allergies as of 03/04/2024 - Review Complete 03/04/2024  Allergen Reaction Noted   Haloperidol Shortness Of Breath, Itching, and Rash 08/28/2014   Lisinopril Shortness Of Breath 07/12/2014   Other Other (See Comments) 07/03/2020   Pregabalin Other (See Comments), Palpitations, and Rash 07/12/2014   Risperidone and paliperidone Shortness Of Breath, Itching, and Rash 08/28/2014   Gabapentin Other (See Comments) and Swelling 09/27/2014   Ibuprofen Rash 01/18/2014   Meloxicam Rash and Other (See Comments) 07/07/2019   Naproxen  Other (See Comments) 08/31/2014   Penicillins Hives and Swelling 01/18/2014   Tramadol Rash 01/18/2014    History reviewed. No pertinent family history.  Social History   Socioeconomic History   Marital status: Single    Spouse name: Not on file   Number of children: Not on file   Years of education: Not on file   Highest education level: Not on file  Occupational History   Not on file  Tobacco Use   Smoking status: Former    Types: Cigarettes   Smokeless tobacco: Never  Vaping Use   Vaping status: Never Used  Substance and Sexual Activity   Alcohol use: No   Drug use: Yes    Types: Marijuana    Comment: Last used: Today.    Sexual activity: Not Currently  Other Topics Concern   Not on file  Social History Narrative   Not on file   Social Drivers of Health   Financial Resource Strain: Low Risk  (11/24/2023)   Received from [facility]    Overall  Financial Resource Strain (CARDIA)    Difficulty of Paying Living Expenses: Not hard at all  Food Insecurity: Patient Declined (03/05/2024)   Hunger Vital Sign    Worried About Running Out of Food in the Last Year: Patient declined    Ran Out of Food in the Last Year: Patient declined  Transportation Needs: Patient Declined (03/05/2024)   PRAPARE - Administrator, Civil Service (Medical): Patient declined    Lack of Transportation (Non-Medical): Patient declined  Physical Activity: Insufficiently Active (09/06/2022)   Received from [facility]   Exercise Vital Sign    On average, how many days per week do you engage in moderate to strenuous exercise (like a brisk walk)?: 2 days    On average, how many minutes do you engage in exercise at this level?: 10 min  Stress: Stress Concern Present (09/06/2022)   Received from [facility] of Occupational [facility] - Occupational Stress Questionnaire    Feeling of Stress : To some extent  Social Connections: Unknown (03/05/2024)   Social Connection and Isolation Panel    Frequency of Communication with Friends and Family: Patient declined    Frequency of Social Gatherings with Friends and Family: Not on file    Attends Religious Services: Patient declined    Active Member of Clubs or Organizations: Patient declined    Attends Banker Meetings: Patient declined    Marital Status: Patient declined  Intimate Partner Violence: Not At Risk (03/05/2024)   Humiliation, Afraid, Rape, and Kick questionnaire    Fear of Current or Ex-Partner: No    Emotionally Abused: No    Physically Abused: No    Sexually Abused: No   Review of Systems: Gen: Denies fever, sweats or chills. No weight loss.  CV: Denies chest pain, palpitations or edema. Resp: Denies cough, shortness of breath of hemoptysis.  GI: See  HPI. GU : Denies urinary burning, blood in urine, increased urinary frequency or incontinence. MS:  Denies joint pain, muscles aches or weakness. Derm: Denies rash, itchiness, skin lesions or unhealing ulcers. Psych: Denies depression, anxiety, memory loss or confusion. Heme: Denies easy bruising, bleeding. Neuro:  Denies headaches, dizziness or paresthesias. Endo:  Denies any problems with DM, thyroid  or adrenal function.  Physical Exam: Vital signs in last 24 hours: Temp:  [97.7 F (36.5 C)-98.3 F (36.8 C)] 97.7 F (36.5 C) (09/24 0856) Pulse Rate:  [76-92] 76 (09/24 0856) Resp:  [15-18] 18 (09/24 0856) BP: (123-159)/(81-104) 159/98 (09/24 0856) SpO2:  [97 %-100 %] 100 % (09/24 0856) Last BM Date : 03/07/24 General: Alert 68 year old female in no acute distress. Head:  Normocephalic and atraumatic. Eyes:  No scleral icterus. Conjunctiva pink. Ears:  Normal auditory acuity. Nose:  No deformity, discharge or lesions. Mouth:  Dentition intact. No ulcers or lesions.  Neck:  Supple. No lymphadenopathy or thyromegaly.  Lungs: Breath sounds clear throughout. No wheezes, rhonchi or crackles.  Heart: Rate and rhythm, no murmurs. Abdomen: Protuberant abdomen, mildly distended.  Mild tenderness to the RUQ down to the RLQ without rebound or guarding.  Hypoactive bowel sounds to all 4 quadrants. Rectal: Deferred. Musculoskeletal:  Symmetrical without gross deformities.  Pulses:  Normal pulses noted. Extremities:  Without clubbing or edema. Neurologic:  Alert and  oriented x 4. No focal deficits.  Skin:  Intact without significant lesions or rashes. Psych:  Alert and cooperative. Normal mood and affect.  Intake/Output from previous day: 09/23 0701 - 09/24 0700 In: 480 [  P.O.:480] Out: -  Intake/Output this shift: No intake/output data recorded.  Lab Results: Recent Labs    03/08/24 0548 03/09/24 0321 03/10/24 0308  WBC 6.9 6.6 5.5  HGB 13.4 12.4 13.2  HCT 40.1 38.0 39.2  PLT 144* 144* 199   BMET Recent Labs    03/08/24 0548 03/09/24 0321 03/10/24 0308  NA 137 136 138   K 4.4 5.0 4.0  CL 103 101 104  CO2 25 27 25   GLUCOSE 94 107* 103*  BUN <5* 7* 8  CREATININE 0.99 1.11* 1.01*  CALCIUM 8.9 8.6* 8.7*   LFT No results for input(s): PROT, ALBUMIN, AST, ALT, ALKPHOS, BILITOT, BILIDIR, IBILI in the last 72 hours. PT/INR No results for input(s): LABPROT, INR in the last 72 hours. Hepatitis Panel No results for input(s): HEPBSAG, HCVAB, HEPAIGM, HEPBIGM in the last 72 hours.    Studies/Results: No results found.  IMPRESSION/PLAN:  68 year old female admitted 03/05/2024 with RLQ pain, persistent diverticulitis. Prior CT in the ED 9/10 showed sigmoid diverticulitis treated with Cipro  and Flagyl  x 7 days without improvement. CTAP 9/19 showed persistent segmental wall thickening to the rectosigmoid colon compatible with diverticulitis versus colitis without perforation or abscess and marked fecal retention in the proximal colon without obstruction or ileus. On Flagyl  IV twice daily.  Magnesium  level 2.1.  Potassium 4.0. - Await abdominal x-ray result -Consider repeat CTAP if abdominal pain or abdominal distention worsens -Continue Flagyl  IV - Limit narcotic use - Diet as tolerated - Ondansetron  4 mg PR IV every 6 hours as needed - Ambulate as tolerated - Await further recommendations per Dr. Albertus  Constipation, no improvement on MiraLAX , sorbitol  and senna.  Constipation during admission persists likely secondary to Dicyclomine , Oxycodone  and suspect component of SIBO. - Await abdominal x-ray results are to considering MiraLAX  purge or other laxatives regimen  Recent H. pylori infection s/p treatment with quadruple therapy - Follow-up with [facility]  History of colon polyps.  Colonoscopy 12/2023 identified 115 mm tubular adenomatous polyp removed from the sigmoid colon.  No documentation of colitis or other colon abnormality. - Patient to follow-up with [facility]  GERD, prior history of Barrett's esophagus not seen EGD  12/2023 and chronic dysphagia with normal esophagus per EGD 12/2023 - Continue famotidine  20 mg twice daily - Pantoprazole  40 mg daily - Follow-up with [facility]   [name]  03/10/2024, 1:35 PM

## 2024-03-11 ENCOUNTER — Inpatient Hospital Stay (HOSPITAL_COMMUNITY)

## 2024-03-11 DIAGNOSIS — R1084 Generalized abdominal pain: Secondary | ICD-10-CM | POA: Diagnosis not present

## 2024-03-11 DIAGNOSIS — R933 Abnormal findings on diagnostic imaging of other parts of digestive tract: Secondary | ICD-10-CM | POA: Diagnosis not present

## 2024-03-11 DIAGNOSIS — K5792 Diverticulitis of intestine, part unspecified, without perforation or abscess without bleeding: Secondary | ICD-10-CM | POA: Diagnosis not present

## 2024-03-11 DIAGNOSIS — K5900 Constipation, unspecified: Secondary | ICD-10-CM | POA: Diagnosis not present

## 2024-03-11 LAB — COMPREHENSIVE METABOLIC PANEL WITH GFR
ALT: 35 U/L (ref 0–44)
AST: 61 U/L — ABNORMAL HIGH (ref 15–41)
Albumin: 3.7 g/dL (ref 3.5–5.0)
Alkaline Phosphatase: 71 U/L (ref 38–126)
Anion gap: 15 (ref 5–15)
BUN: 5 mg/dL — ABNORMAL LOW (ref 8–23)
CO2: 21 mmol/L — ABNORMAL LOW (ref 22–32)
Calcium: 9.2 mg/dL (ref 8.9–10.3)
Chloride: 101 mmol/L (ref 98–111)
Creatinine, Ser: 0.79 mg/dL (ref 0.44–1.00)
GFR, Estimated: >60 mL/min (ref >60)
Glucose, Bld: 94 mg/dL (ref 70–99)
Potassium: 4 mmol/L (ref 3.5–5.1)
Sodium: 137 mmol/L (ref 135–145)
Total Bilirubin: 0.6 mg/dL (ref 0.0–1.2)
Total Protein: 6.9 g/dL (ref 6.5–8.1)

## 2024-03-11 LAB — CBC
HCT: 43.2 % (ref 36.0–46.0)
Hemoglobin: 14.6 g/dL (ref 12.0–15.0)
MCH: 31.3 pg (ref 26.0–34.0)
MCHC: 33.8 g/dL (ref 30.0–36.0)
MCV: 92.7 fL (ref 80.0–100.0)
Platelets: 173 K/uL (ref 150–400)
RBC: 4.66 MIL/uL (ref 3.87–5.11)
RDW: 13.5 % (ref 11.5–15.5)
WBC: 5.6 K/uL (ref 4.0–10.5)
nRBC: 0 % (ref 0.0–0.2)

## 2024-03-11 MED ORDER — METOCLOPRAMIDE HCL 5 MG/ML IJ SOLN
10.0000 mg | Freq: Four times a day (QID) | INTRAMUSCULAR | Status: AC
  Administered 2024-03-11 – 2024-03-12 (×4): 10 mg via INTRAVENOUS
  Filled 2024-03-11 (×4): qty 2

## 2024-03-11 MED ORDER — LINACLOTIDE 145 MCG PO CAPS
290.0000 ug | ORAL_CAPSULE | Freq: Every day | ORAL | Status: DC
Start: 2024-03-11 — End: 2024-03-15
  Administered 2024-03-11 – 2024-03-15 (×5): 290 ug via ORAL
  Filled 2024-03-11 (×6): qty 2

## 2024-03-11 MED ORDER — POLYETHYLENE GLYCOL 3350 17 GM/SCOOP PO POWD
238.0000 g | Freq: Once | ORAL | Status: AC
Start: 2024-03-11 — End: 2024-03-11
  Administered 2024-03-11: 238 g via ORAL
  Filled 2024-03-11: qty 238

## 2024-03-11 NOTE — Plan of Care (Signed)
   Problem: Education: Goal: Knowledge of General Education information will improve Description: Including pain rating scale, medication(s)/side effects and non-pharmacologic comfort measures Outcome: Progressing   Problem: Pain Managment: Goal: General experience of comfort will improve and/or be controlled Outcome: Progressing   Problem: Safety: Goal: Ability to remain free from injury will improve Outcome: Progressing

## 2024-03-11 NOTE — Progress Notes (Signed)
 [facility]  PROGRESS NOTE  Pamela Graham FMW:[id number] DOB: June 29, 1955 DOA: 03/04/2024 PCP: [name] LABOR, MD   Brief HPI:    Patient with PMH of HTN, bipolar, Barrett's, migraine, fibromyalgia presents the hospital with complaints of abdominal pain. Reports that end of July she went for a colonoscopy. Since then she has some abdominal pain. Was treated for H. pylori infection. Presented to ED on 9/10 for complaints of abdominal pain and constipation. Went to see her primary GI on 9/12 who recommended CT abdomen which showed evidence of diverticulitis versus colitis.  Started on Cipro  and Flagyl  and full liquid diet. Present again to ED on 9/18 for complaints of abdominal constipation. Currently being treated for constipation.     Assessment/Plan:   Severe constipation. Recurrent abdominal pain. Suspect that her pain is primarily coming from severe constipation based on the appearance. She reports spasmodic pain all over her abdomen primarily more on the right side. There is no evidence of ascending colon diverticulitis or colitis. Continue bowel regimen.  Continue soft diet. Mag citrate ordered but does not appear to be effective.  GoLytely  ordered unable to tolerate due to nausea. MiraLAX  bowel prep was given on 9/23 so far no BM.   X-ray abdomen showed nonobstructive bowel gas pattern, persistent moderate colonic stool burden. - Gastroenterology consulted, Suprep ordered however patient vomited -Further recommendations per GI   Diverticulitis versus colitis. Patient had completed her oral antibiotic outpatient. Continues to have abdominal pain.  CT shows evidence of ongoing presence of diverticulitis but no evidence of perforation or rupture. Admitted for concern for failed outpatient therapy. For now we will continue with IV Flagyl  twice a day   Fibromyalgia and bipolar disorder. Home regimen includes Cymbalta  60 mg daily, Tegretol  200 mg daily, lorazepam  2 mg  daily, Ambien  10 mg daily, Will continue most of the medications. Lorazepam  switched to as needed.   GERD. Continue PPI.   HTN. On amlodipine  10 mg daily    Chronic arthritis. Possible is up with orthopedic outpatient. PT OT consulted. Walker arranged.   Obesity Class 1 Body mass index is 33.67 kg/m.  Placing the pt at higher risk of poor outcomes.    Rash. Patient  developed a rash on 9/23 She was getting ceftriaxone  which was stopped as patient has penicillin allergy -Rash seems to have resolved    Medications     amLODipine   10 mg Oral Daily   carbamazepine   200 mg Oral Daily   dicyclomine   10 mg Oral TID AC   DULoxetine   60 mg Oral Daily   enoxaparin  (LOVENOX ) injection  40 mg Subcutaneous Q24H   famotidine   20 mg Oral BID   hydrocortisone  cream   Topical TID   Na Sulfate-K Sulfate-Mg Sulfate concentrate  0.5 kit Oral Once   pantoprazole   40 mg Oral Daily   senna-docusate  2 tablet Oral QHS   simethicone   80 mg Oral QID   sorbitol   60 mL Oral BID     Data Reviewed:   CBG:  No results for input(s): GLUCAP in the last 168 hours.  SpO2: 100 %    Vitals:   03/10/24 1724 03/10/24 2000 03/11/24 0518 03/11/24 0838  BP: (!) 144/79 (!) 127/91 (!) 141/92 (!) 135/93  Pulse: 80 84 82 79  Resp: 18 20 19 16   Temp: 98.8 F (37.1 C) 98.3 F (36.8 C) 97.8 F (36.6 C) 97.8 F (36.6 C)  TempSrc: Oral Oral Oral Oral  SpO2: 97% 95% 95% 100%  Weight:      Height:          Data Reviewed:  Basic Metabolic Panel: Recent Labs  Lab 03/05/24 0804 03/06/24 0539 03/07/24 0332 03/08/24 0548 03/09/24 0321 03/10/24 0308  NA 139 140 138 137 136 138  K 3.1* 3.7 3.6 4.4 5.0 4.0  CL 105 105 106 103 101 104  CO2 23 25 23 25 27 25   GLUCOSE 128* 87 91 94 107* 103*  BUN 6* <5* 6* <5* 7* 8  CREATININE 0.88 0.97 0.85 0.99 1.11* 1.01*  CALCIUM 9.2 8.9 8.6* 8.9 8.6* 8.7*  MG 1.9 1.9 1.9 2.3 2.3 2.1  PHOS 3.7  --   --   --   --   --     CBC: Recent Labs  Lab  03/06/24 0539 03/07/24 0332 03/08/24 0548 03/09/24 0321 03/10/24 0308  WBC 5.6 6.1 6.9 6.6 5.5  HGB 12.9 12.1 13.4 12.4 13.2  HCT 39.1 36.8 40.1 38.0 39.2  MCV 94.4 95.1 94.6 96.9 94.0  PLT 147* 143* 144* 144* 199    LFT Recent Labs  Lab 03/04/24 1653  AST 20  ALT 14  ALKPHOS 65  BILITOT 0.6  PROT 7.3  ALBUMIN 4.2     Antibiotics: Anti-infectives (From admission, onward)    Start     Dose/Rate Route Frequency Ordered Stop   03/05/24 0330  cefTRIAXone  (ROCEPHIN ) 2 g in sodium chloride  0.9 % 100 mL IVPB  Status:  Discontinued        2 g 200 mL/hr over 30 Minutes Intravenous Every 24 hours 03/05/24 0322 03/07/24 1303   03/05/24 0330  metroNIDAZOLE  (FLAGYL ) IVPB 500 mg        500 mg 100 mL/hr over 60 Minutes Intravenous Every 12 hours 03/05/24 0322     03/05/24 0245  metroNIDAZOLE  (FLAGYL ) IVPB 500 mg  Status:  Discontinued        500 mg 100 mL/hr over 60 Minutes Intravenous  Once 03/05/24 0240 03/05/24 0337   03/05/24 0245  cefTRIAXone  (ROCEPHIN ) 1 g in sodium chloride  0.9 % 100 mL IVPB  Status:  Discontinued        1 g 200 mL/hr over 30 Minutes Intravenous  Once 03/05/24 0240 03/05/24 9662        DVT prophylaxis: Lovenox   Code Status: Full code  Family Communication: Discussed with patient's daughter at bedside   CONSULTS none   Subjective   Patient seen, could not tolerate Suprep.  Has been vomiting.  No bowel movement yet.   Objective    Physical Examination:   General-appears in no acute distress Heart-S1-S2, regular, no murmur auscultated Lungs-clear to auscultation bilaterally, no wheezing or crackles auscultated Abdomen-soft, nontender, distended, no organomegaly Extremities-no edema in the lower extremities Neuro-alert, oriented x3, no focal deficit noted   Status is: Inpatient:             Pamela Graham   [facility] If 7PM-7AM, please contact night-coverage at [email or web address], Office  [phone]   03/11/2024,  11:00 AM  LOS: 6 days

## 2024-03-11 NOTE — Progress Notes (Signed)
 [facility] Progress Note  CC: Diverticulitis, constipation  Subjective: She was unable to tolerate Suprep yesterday evening, she vomited it up.  She stated receiving Zofran  but review of her MAR showed that the last dose of Zofran  was administered yesterday morning at 9 AM.  No BM since admission.  No gas per the rectum today.  She endorses having worsening right upper and right lower abdominal pain. Last dose of Oxycodone  was 5:25 AM.  Patient asked that I speak to her granddaughter ( she is a Engineer, civil (consulting)) on speaker phone and I provided an update regarding the patient's current status.   Objective:  Vital signs in last 24 hours: Temp:  [97.8 F (36.6 C)-98.8 F (37.1 C)] 97.8 F (36.6 C) (09/25 0838) Pulse Rate:  [79-84] 79 (09/25 0838) Resp:  [16-20] 16 (09/25 0838) BP: (127-144)/(79-93) 135/93 (09/25 0838) SpO2:  [95 %-100 %] 100 % (09/25 0838) Last BM Date : 03/07/24 General: Alert 68 year old female intermittently tachypneic in no acute distress. Heart: Regular rate and rhythm, no murmurs. Pulm: Sounds clear throughout.  Respirations intermittently tachypneic, in no acute respiratory distress. Abdomen: Abdomen is distended with diffuse tenderness to the RUQ extending to the RLQ without rebound or guarding.  Hypoactive bowel sounds to all quadrants. Extremities: No lower extremity edema. Neurologic:  Alert and oriented x 4.  Speech is clear.  Moves all extremities equally. Psych:  Alert and cooperative. Normal mood and affect.  Intake/Output from previous day: 09/24 0701 - 09/25 0700 In: 6.5 [I.V.:6.5] Out: -  Intake/Output this shift: No intake/output data recorded.  Lab Results: Recent Labs    03/09/24 0321 03/10/24 0308  WBC 6.6 5.5  HGB 12.4 13.2  HCT 38.0 39.2  PLT 144* 199   BMET Recent Labs    03/09/24 0321 03/10/24 0308  NA 136 138  K 5.0 4.0  CL 101 104  CO2 27 25  GLUCOSE 107* 103*  BUN 7* 8  CREATININE 1.11* 1.01*  CALCIUM 8.6*  8.7*   LFT No results for input(s): PROT, ALBUMIN, AST, ALT, ALKPHOS, BILITOT, BILIDIR, IBILI in the last 72 hours. PT/INR No results for input(s): LABPROT, INR in the last 72 hours. Hepatitis Panel No results for input(s): HEPBSAG, HCVAB, HEPAIGM, HEPBIGM in the last 72 hours.  DG Abd 2 Views Result Date: 03/10/2024 CLINICAL DATA:  Constipation EXAM: ABDOMEN - 2 VIEW COMPARISON:  March 08, 2024 FINDINGS: Nonobstructive bowel gas pattern. Persistent moderate colonic stool burden. Pelvic phleboliths. No acute osseous findings. No free air. IMPRESSION: Nonobstructive bowel gas pattern. Persistent moderate colonic stool burden. Electronically Signed   By: Michaeline Blanch M.D.   On: 03/10/2024 14:45   Patient Profile: Pamela Graham is a 68 y.o. female with a past medical history of hypertension, fibromyalgia, bipolar disorder, migraine headaches, DDD, GERD, H. pylori, Barrett's esophagus, colon polyps and diverticulitis. Admitted 03/05/2024 with persistent nausea and RLQ pain.  Assessment / Plan:  68 year old female admitted 03/05/2024 with RLQ pain, persistent diverticulitis. Prior CT in the ED 9/10 showed sigmoid diverticulitis treated with Cipro  and Flagyl  x 7 days without improvement. CTAP 9/19 showed persistent segmental wall thickening to the rectosigmoid colon compatible with diverticulitis versus colitis without perforation or abscess and marked fecal retention in the proximal colon without obstruction or ileus. On Flagyl  IV twice daily. Abdominal x-ray 9/24 showed moderate colonic stool burden without obstruction.  No response to multiple oral laxatives therefore Suprep was ordered yesterday which she did not tolerate, vomited it up.  No gas per the rectum today.  Patient endorses having worsening abdominal pain with assessed intermittent tachypnea. - CBC, CMP - Ondansetron  4mg  IV x 1 now, discussed with RN - Ondansetron  4 mg PR IV every 6 hours as needed - RN to  obtain labs including oxygen saturation now - Stat CTAP without contrast - Continue Flagyl  IV - Limit narcotic use - Clear liquid diet - Ambulate as tolerated - Await further recommendations per Dr. Albertus   Constipation, no improvement on MiraLAX , Sorbitol  and Senna. Constipation during admission persists likely secondary to Dicyclomine , Oxycodone  and suspect component of SIBO. - Await CTAP results - Bowel regimen to be determined    Recent H. pylori infection s/p treatment with quadruple therapy - Follow-up with [facility]   History of colon polyps.  Colonoscopy 12/2023 identified 115 mm tubular adenomatous polyp removed from the sigmoid colon.  No documentation of colitis or other colon abnormality. - Patient to follow-up with [facility]       Principal Problem:   Diverticulitis     LOS: 6 days   Pamela Graham  03/11/2024, 12:41 PM

## 2024-03-11 NOTE — Progress Notes (Signed)
 Mobility Specialist Progress Note:    03/11/24 0956  Mobility  Activity Ambulated with assistance (In hallway)  Level of Assistance Standby assist, set-up cues, supervision of patient - no hands on  Assistive Device Front wheel walker  Distance Ambulated (ft) 400 ft  Activity Response Tolerated well  Mobility Referral Yes  Mobility visit 1 Mobility  Mobility Specialist Start Time (ACUTE ONLY) 0913  Mobility Specialist Stop Time (ACUTE ONLY) 0932  Mobility Specialist Time Calculation (min) (ACUTE ONLY) 19 min   Received pt in bed and agreeable to mobility. No physical assistance needed. C/o abdominal pain, otherwise tolerated well. Returned to room without fault. Left pt in bed. Personal belongings and call light within reach. All needs met.  Lavanda Pollack Mobility Specialist  Please contact via [employer] or  Rehab Office [phone]

## 2024-03-11 NOTE — Progress Notes (Signed)
 PT Cancellation Note  Patient Details Name: Pamela Graham MRN: [id number] DOB: [birth year]   Cancelled Treatment:    Reason Eval/Treat Not Completed: (P) Fatigue/lethargy limiting ability to participate;Pain limiting ability to participate, pt declining all mobility. Will check back as schedule allows to continue with PT POC.  [name]. PTA Acute Rehabilitation Services Office: [phone]    [name] 03/11/2024, 3:52 PM

## 2024-03-12 DIAGNOSIS — K529 Noninfective gastroenteritis and colitis, unspecified: Secondary | ICD-10-CM | POA: Diagnosis not present

## 2024-03-12 DIAGNOSIS — R933 Abnormal findings on diagnostic imaging of other parts of digestive tract: Secondary | ICD-10-CM | POA: Diagnosis not present

## 2024-03-12 DIAGNOSIS — K5900 Constipation, unspecified: Secondary | ICD-10-CM

## 2024-03-12 DIAGNOSIS — R1084 Generalized abdominal pain: Secondary | ICD-10-CM | POA: Diagnosis not present

## 2024-03-12 LAB — BASIC METABOLIC PANEL WITH GFR
Anion gap: 13 (ref 5–15)
BUN: 6 mg/dL — ABNORMAL LOW (ref 8–23)
CO2: 21 mmol/L — ABNORMAL LOW (ref 22–32)
Calcium: 8.8 mg/dL — ABNORMAL LOW (ref 8.9–10.3)
Chloride: 99 mmol/L (ref 98–111)
Creatinine, Ser: 0.98 mg/dL (ref 0.44–1.00)
GFR, Estimated: >60 mL/min (ref >60)
Glucose, Bld: 81 mg/dL (ref 70–99)
Potassium: 3.7 mmol/L (ref 3.5–5.1)
Sodium: 133 mmol/L — ABNORMAL LOW (ref 135–145)

## 2024-03-12 LAB — TSH: TSH: 4.359 u[IU]/mL (ref 0.350–4.500)

## 2024-03-12 LAB — HEPATIC FUNCTION PANEL
ALT: 31 U/L (ref 0–44)
AST: 40 U/L (ref 15–41)
Albumin: 3.3 g/dL — ABNORMAL LOW (ref 3.5–5.0)
Alkaline Phosphatase: 64 U/L (ref 38–126)
Bilirubin, Direct: <0.1 mg/dL (ref 0.0–0.2)
Total Bilirubin: 0.4 mg/dL (ref 0.0–1.2)
Total Protein: 6.3 g/dL — ABNORMAL LOW (ref 6.5–8.1)

## 2024-03-12 LAB — C-REACTIVE PROTEIN: CRP: 0.7 mg/dL (ref <1.0)

## 2024-03-12 MED ORDER — DICYCLOMINE HCL 10 MG PO CAPS
10.0000 mg | ORAL_CAPSULE | Freq: Two times a day (BID) | ORAL | Status: DC
  Administered 2024-03-12 – 2024-03-15 (×5): 10 mg via ORAL
  Filled 2024-03-12 (×5): qty 1

## 2024-03-12 MED ORDER — SODIUM CHLORIDE 0.9% FLUSH: 10.0000 mL | INTRAVENOUS | Status: DC | PRN

## 2024-03-12 MED ORDER — SODIUM CHLORIDE 0.9% FLUSH
10.0000 mL | Freq: Two times a day (BID) | INTRAVENOUS | Status: DC
  Administered 2024-03-12 – 2024-03-15 (×6): 10 mL

## 2024-03-12 MED ORDER — POLYETHYLENE GLYCOL 3350 17 GM/SCOOP PO POWD
238.0000 g | Freq: Once | ORAL | Status: AC
  Administered 2024-03-13: 238 g via ORAL
  Filled 2024-03-12: qty 238

## 2024-03-12 MED ORDER — SODIUM CHLORIDE 0.9% FLUSH
10.0000 mL | Freq: Two times a day (BID) | INTRAVENOUS | Status: DC
  Administered 2024-03-12 – 2024-03-15 (×6): 10 mL

## 2024-03-12 MED ORDER — CHLORHEXIDINE GLUCONATE CLOTH 2 % EX PADS
6.0000 | MEDICATED_PAD | Freq: Every day | CUTANEOUS | Status: DC
  Administered 2024-03-12 – 2024-03-15 (×3): 6 via TOPICAL

## 2024-03-12 MED ORDER — BOOST / RESOURCE BREEZE PO LIQD CUSTOM
1.0000 | Freq: Three times a day (TID) | ORAL | Status: DC
Start: 2024-03-12 — End: 2024-03-15
  Administered 2024-03-12 – 2024-03-14 (×8): 1 via ORAL

## 2024-03-12 NOTE — Progress Notes (Signed)
 Patient has half a pitcher left of the Miralax  mixed in Gatorade. No bowel movement over night but has had gas.

## 2024-03-12 NOTE — Progress Notes (Signed)
 [facility]  PROGRESS NOTE  Pamela Graham FMW:[id number] DOB: Oct 28, 1955 DOA: 03/04/2024 PCP: [name] LABOR, MD   Brief HPI:    Patient with PMH of HTN, bipolar, Barrett's, migraine, fibromyalgia presents the hospital with complaints of abdominal pain. Reports that end of July she went for a colonoscopy. Since then she has some abdominal pain. Was treated for H. pylori infection. Presented to ED on 9/10 for complaints of abdominal pain and constipation. Went to see her primary GI on 9/12 who recommended CT abdomen which showed evidence of diverticulitis versus colitis.  Started on Cipro  and Flagyl  and full liquid diet. Present again to ED on 9/18 for complaints of abdominal constipation. Currently being treated for constipation.     Assessment/Plan:   Severe constipation. Recurrent abdominal pain. Suspect that her pain is primarily coming from severe constipation based on the appearance. She reports spasmodic pain all over her abdomen primarily more on the right side. There is no evidence of ascending colon diverticulitis or colitis. Continue bowel regimen.  Continue soft diet. Mag citrate ordered but does not appear to be effective.  GoLytely  ordered unable to tolerate due to nausea. MiraLAX  bowel prep was given on 9/23 so far no BM.   X-ray abdomen showed nonobstructive bowel gas pattern, persistent moderate colonic stool burden. - Gastroenterology consulted, Suprep ordered however patient vomited - Had small BM today, after patient drank most of the MiraLAX  prep -GI following, plan for flex sig after patient bowels are adequately cleaned.   Diverticulitis versus colitis. Patient had completed her oral antibiotic outpatient. Continues to have abdominal pain.  CT shows evidence of ongoing presence of diverticulitis but no evidence of perforation or rupture. Admitted for concern for failed outpatient therapy. For now we will continue with IV Flagyl  twice a day    Fibromyalgia and bipolar disorder. Home regimen includes Cymbalta  60 mg daily, Tegretol  200 mg daily, lorazepam  2 mg daily, Ambien  10 mg daily, Will continue most of the medications. Lorazepam  switched to as needed.   GERD. Continue PPI.   HTN. On amlodipine  10 mg daily    Chronic arthritis. Possible is up with orthopedic outpatient. PT OT consulted. Walker arranged.   Obesity Class 1 Body mass index is 33.67 kg/m.  Placing the pt at higher risk of poor outcomes.    Rash. Patient  developed a rash on 9/23 She was getting ceftriaxone  which was stopped as patient has penicillin allergy -Rash seems to have resolved    Medications     amLODipine   10 mg Oral Daily   carbamazepine   200 mg Oral Daily   DULoxetine   60 mg Oral Daily   enoxaparin  (LOVENOX ) injection  40 mg Subcutaneous Q24H   famotidine   20 mg Oral BID   feeding supplement  1 Container Oral TID BM   hydrocortisone  cream   Topical TID   linaclotide   290 mcg Oral QAC breakfast   pantoprazole   40 mg Oral Daily   simethicone   80 mg Oral QID     Data Reviewed:   CBG:  No results for input(s): GLUCAP in the last 168 hours.  SpO2: 99 %    Vitals:   03/11/24 1616 03/11/24 2030 03/12/24 0546 03/12/24 0735  BP: (!) 135/93 121/86 118/87 (!) 148/95  Pulse: 85 82 86 83  Resp: 18 18 18 18   Temp: 98.1 F (36.7 C) 98.1 F (36.7 C) 97.7 F (36.5 C) 98.4 F (36.9 C)  TempSrc: Oral Oral Oral Oral  SpO2: 98% 99% 94%  99%  Weight:      Height:          Data Reviewed:  Basic Metabolic Panel: Recent Labs  Lab 03/06/24 0539 03/07/24 0332 03/08/24 0548 03/09/24 0321 03/10/24 0308 03/11/24 1225 03/12/24 0525  NA 140 138 137 136 138 137 133*  K 3.7 3.6 4.4 5.0 4.0 4.0 3.7  CL 105 106 103 101 104 101 99  CO2 25 23 25 27 25  21* 21*  GLUCOSE 87 91 94 107* 103* 94 81  BUN <5* 6* <5* 7* 8 5* 6*  CREATININE 0.97 0.85 0.99 1.11* 1.01* 0.79 0.98  CALCIUM 8.9 8.6* 8.9 8.6* 8.7* 9.2 8.8*  MG 1.9 1.9 2.3  2.3 2.1  --   --     CBC: Recent Labs  Lab 03/07/24 0332 03/08/24 0548 03/09/24 0321 03/10/24 0308 03/11/24 1225  WBC 6.1 6.9 6.6 5.5 5.6  HGB 12.1 13.4 12.4 13.2 14.6  HCT 36.8 40.1 38.0 39.2 43.2  MCV 95.1 94.6 96.9 94.0 92.7  PLT 143* 144* 144* 199 173    LFT Recent Labs  Lab 03/11/24 1225 03/12/24 0525  AST 61* 40  ALT 35 31  ALKPHOS 71 64  BILITOT 0.6 0.4  PROT 6.9 6.3*  ALBUMIN 3.7 3.3*     Antibiotics: Anti-infectives (From admission, onward)    Start     Dose/Rate Route Frequency Ordered Stop   03/05/24 0330  cefTRIAXone  (ROCEPHIN ) 2 g in sodium chloride  0.9 % 100 mL IVPB  Status:  Discontinued        2 g 200 mL/hr over 30 Minutes Intravenous Every 24 hours 03/05/24 0322 03/07/24 1303   03/05/24 0330  metroNIDAZOLE  (FLAGYL ) IVPB 500 mg        500 mg 100 mL/hr over 60 Minutes Intravenous Every 12 hours 03/05/24 0322     03/05/24 0245  metroNIDAZOLE  (FLAGYL ) IVPB 500 mg  Status:  Discontinued        500 mg 100 mL/hr over 60 Minutes Intravenous  Once 03/05/24 0240 03/05/24 0337   03/05/24 0245  cefTRIAXone  (ROCEPHIN ) 1 g in sodium chloride  0.9 % 100 mL IVPB  Status:  Discontinued        1 g 200 mL/hr over 30 Minutes Intravenous  Once 03/05/24 0240 03/05/24 9662        DVT prophylaxis: Lovenox   Code Status: Full code  Family Communication: Discussed with patient's daughter at bedside   CONSULTS none   Subjective   Had a small bowel movement today.  Complains of severe domino cramping.   Objective    Physical Examination:  General-appears in no acute distress Heart-S1-S2, regular, no murmur auscultated Lungs-clear to auscultation bilaterally, no wheezing or crackles auscultated Abdomen-soft, generalized tenderness to palpation Extremities-no edema in the lower extremities Neuro-alert, oriented x3, no focal deficit noted  Status is: Inpatient:             Pamela Graham   [facility] If 7PM-7AM, please contact  night-coverage at [email or web address], Office  [phone]   03/12/2024, 12:14 PM  LOS: 7 days

## 2024-03-12 NOTE — Progress Notes (Signed)
 Patient ID: Pamela Graham, female   DOB: 03-12-1956, 68 y.o.   MRN: [id number]    Progress Note   Subjective   Day # 7  CC; abdominal pain, constipation abnormal imaging of sigmoid colon more consistent with colitis  IV metronidazole    Repeat CT abdomen and pelvis yesterday without contrast-prominence of stool throughout the colon, diffuse wall thickening of the sigmoid colon although sparing the rectum without substantial surrounding pericolonic stranding few scattered sigmoid diverticuli which do not appear individually inflamed no abscess  MiraLAX  purge initiated yesterday  Labs today CRP 0.7 Sodium 133/potassium 3.7/BUN 6/creatinine 0.98  Patient has managed to drink most of the MiraLAX  prep, has had severe cramping/spasms that she equates to labor pains with bowel prep she was able to have 1 constipated bowel movement this morning and has not had any more stool since she has been using a heating pad which helps   Objective   Vital signs in last 24 hours: Temp:  [97.4 F (36.3 C)-98.4 F (36.9 C)] 98.4 F (36.9 C) (09/26 0735) Pulse Rate:  [82-86] 83 (09/26 0735) Resp:  [18-20] 18 (09/26 0735) BP: (118-186)/(86-102) 148/95 (09/26 0735) SpO2:  [94 %-99 %] 99 % (09/26 0735) Last BM Date : 03/12/24 General:    Older African-American female in NAD Heart:  Regular rate and rhythm; no murmurs Lungs: Respirations even and unlabored, lungs CTA bilaterally Abdomen:  Soft, nondistended, tender in the lower abdomen left greater than right. Normal bowel sounds. Extremities:  Without edema. Neurologic:  Alert and oriented,  grossly normal neurologically. Psych:  Cooperative. Normal mood and affect.  Intake/Output from previous day: 09/25 0701 - 09/26 0700 In: 100 [P.O.:100] Out: -  Intake/Output this shift: No intake/output data recorded.  Lab Results: Recent Labs    03/10/24 0308 03/11/24 1225  WBC 5.5 5.6  HGB 13.2 14.6  HCT 39.2 43.2  PLT 199 173   BMET Recent Labs     03/10/24 0308 03/11/24 1225 03/12/24 0525  NA 138 137 133*  K 4.0 4.0 3.7  CL 104 101 99  CO2 25 21* 21*  GLUCOSE 103* 94 81  BUN 8 5* 6*  CREATININE 1.01* 0.79 0.98  CALCIUM 8.7* 9.2 8.8*   LFT Recent Labs    03/12/24 0525  PROT 6.3*  ALBUMIN 3.3*  AST 40  ALT 31  ALKPHOS 64  BILITOT 0.4  BILIDIR <0.1  IBILI NOT CALCULATED   PT/INR No results for input(s): LABPROT, INR in the last 72 hours.  Studies/Results: CT ABDOMEN PELVIS WO CONTRAST Result Date: 03/11/2024 EXAM: CT ABDOMEN AND PELVIS WITHOUT CONTRAST 03/11/2024 01:50:07 PM TECHNIQUE: CT of the abdomen and pelvis was performed without the administration of intravenous contrast. Multiplanar reformatted images are provided for review. Automated exposure control, iterative reconstruction, and/or weight-based adjustment of the mA/kV was utilized to reduce the radiation dose to as low as reasonably achievable. COMPARISON: None available. CLINICAL HISTORY: Bowel obstruction suspected. Constipation. Suspected diverticulitis. Vomiting. Right upper and lower abdominal pain. FINDINGS: LOWER CHEST: No acute abnormality. LIVER: The liver is unremarkable. No portal venous gas. GALLBLADDER AND BILE DUCTS: Gallbladder is unremarkable. No biliary ductal dilatation. SPLEEN: No acute abnormality. PANCREAS: No acute abnormality. ADRENAL GLANDS: No acute abnormality. KIDNEYS, URETERS AND BLADDER: No stones in the kidneys or ureters. No hydronephrosis. No perinephric or periureteral stranding. Urinary bladder is unremarkable. GI AND BOWEL: Stomach demonstrates no acute abnormality. Prominence of stool throughout the colon. Diffuse wall thickening in the sigmoid colon although sparing the rectum without  substantial surrounding pericolic stranding. There are a few scattered sigmoid colon diverticula which do not appear individually inflamed. No abscess observed. No dilated small bowel. Normal appendix. There is no bowel obstruction. PERITONEUM AND  RETROPERITONEUM: No ascites. No free air. No pneumatosis. VASCULATURE: Aorta is normal in caliber. Systemic atherosclerosis is present, including the aorta and iliac arteries. LYMPH NODES: No lymphadenopathy. REPRODUCTIVE ORGANS: Uterus absent. BONES AND SOFT TISSUES: Degenerative facet arthropathy bilaterally at L4-L5 without impingement. No acute osseous abnormality. No focal soft tissue abnormality. IMPRESSION: 1. No obstruction, although there is abnormal prominence of stool throughout the colon leading up to the inflamed sigmoid colon. 2. Diffuse sigmoid colonic wall thickening with scattered diverticula, without diverticulitis or abscess. Appearance favors sigmoid colitis, most likely to be infectious No specific indicators of ischemic colitis or other secondary findings of inflammatory bowel disease. 3. Degenerative facet arthropathy at L4-5 without impingement. 4. Abdominal aortic atherosclerosis. Electronically signed by: Ryan Salvage MD 03/11/2024 02:28 PM EDT RP Workstation: [id number]       Assessment / Plan:    #78  68 year old female admitted 03/05/2024 with right lower quadrant pain, persistent diverticulitis.  She had had CT on 02/25/2024 that did show sigmoid diverticulitis and was treated with Cipro  and Flagyl  x 7 days without improvement.  Follow-up CT on 03/05/2024 showing persistent segmental wall thickening to the rectosigmoid colon compatible with diverticulitis versus colitis, no evidence of perforation or abscess but marked fecal retention. Continued on IV Flagyl  Patient underwent repeat CT yesterday due to continued complaints of severe abdominal pain-there is continued prominence of stool throughout the colon leading up to an inflamed appearing sigmoid colon with diffuse sigmoid colon wall thickening with scattered diverticuli without diverticulitis or abscess appearance favors sigmoid colitis infectious versus inflammatory.  Rule out possible SCAD-segmental colitis associated  with diverticulitis  Patient has been trying to drink a bowel prep, completed most of the MiraLAX  prep but has only had 1 bowel movement She has also been on Linzess  started yesterday  Fortunately no fever no leukocytosis and normal CRP   Plan; full liquid diet Complete the current MiraLAX  prep and will order another MiraLAX  Gatorade prep for tomorrow she needs to get her bowel purged Once she has adequately purged her bowel we will plan for flexible sigmoidoscopy with biopsies.  Possible procedure Sunday  Will add Bentyl  10 mg p.o. twice daily as needed for cramping/spasms, try to minimize narcotics     Principal Problem:   Diverticulitis     LOS: 7 days   Thaddus Mcdowell PA-C 03/12/2024, 1:25 PM

## 2024-03-12 NOTE — Progress Notes (Addendum)

## 2024-03-12 NOTE — Plan of Care (Signed)
   Problem: Education: Goal: Knowledge of General Education information will improve Description Including pain rating scale, medication(s)/side effects and non-pharmacologic comfort measures Outcome: Progressing   Problem: Health Behavior/Discharge Planning: Goal: Ability to manage health-related needs will improve Outcome: Progressing

## 2024-03-12 NOTE — Progress Notes (Signed)
 PT Cancellation Note  Patient Details Name: Pamela Graham MRN: [id number] DOB: 1956-01-25   Cancelled Treatment:    Reason Eval/Treat Not Completed: Pain limiting ability to participate (Pt declines; reports high levels of cramping pain. Will follow up as able and appropriate.)  [name], DPT, CLT  Acute Rehabilitation Services Office: [phone] (Secure chat preferred)   [name] 03/12/2024, 3:31 PM

## 2024-03-12 NOTE — Plan of Care (Signed)

## 2024-03-13 DIAGNOSIS — K5900 Constipation, unspecified: Secondary | ICD-10-CM | POA: Diagnosis not present

## 2024-03-13 DIAGNOSIS — R1084 Generalized abdominal pain: Secondary | ICD-10-CM | POA: Diagnosis not present

## 2024-03-13 DIAGNOSIS — R933 Abnormal findings on diagnostic imaging of other parts of digestive tract: Secondary | ICD-10-CM | POA: Diagnosis not present

## 2024-03-13 MED ORDER — ENOXAPARIN SODIUM 40 MG/0.4ML IJ SOSY
40.0000 mg | PREFILLED_SYRINGE | INTRAMUSCULAR | Status: DC
  Administered 2024-03-14: 40 mg via SUBCUTANEOUS
  Filled 2024-03-13: qty 0.4

## 2024-03-13 NOTE — Progress Notes (Signed)
 Patient ID: Pamela Graham, female   DOB: 07/07/1955, 68 y.o.   MRN: [id number]    Progress Note   Subjective   Day # 8 CC; acute mental pain, severe obstipation, abnormal imaging of sigmoid colon  IV metronidazole   No new labs today  Patient seems more comfortable today she did finish the MiraLAX  Gatorade prep last evening and is getting ready to start second prep today.  She says she did have a hard stool again this morning followed by a larger amount of soft stool without as much pain.  She is very willing to do another prep today    Objective   Vital signs in last 24 hours: Temp:  [98.2 F (36.8 C)-98.5 F (36.9 C)] 98.2 F (36.8 C) (09/27 0832) Pulse Rate:  [95-107] 95 (09/27 0832) Resp:  [17-18] 18 (09/27 0832) BP: (126-173)/(94-105) 150/94 (09/27 0832) SpO2:  [92 %-100 %] 99 % (09/27 0832) Last BM Date : 03/13/24 General:    Well-developed older African-American female in NAD Heart:  Regular rate and rhythm; no murmurs Lungs: Respirations even and unlabored, lungs CTA bilaterally Abdomen:  Soft, mild tenderness in the lower abdomen left greater than right, no guarding or rebound, normal bowel sounds. Extremities:  Without edema. Neurologic:  Alert and oriented,  grossly normal neurologically. Psych:  Cooperative. Normal mood and affect.  Intake/Output from previous day: No intake/output data recorded. Intake/Output this shift: No intake/output data recorded.  Lab Results: Recent Labs    03/11/24 1225  WBC 5.6  HGB 14.6  HCT 43.2  PLT 173   BMET Recent Labs    03/11/24 1225 03/12/24 0525  NA 137 133*  K 4.0 3.7  CL 101 99  CO2 21* 21*  GLUCOSE 94 81  BUN 5* 6*  CREATININE 0.79 0.98  CALCIUM 9.2 8.8*   LFT Recent Labs    03/12/24 0525  PROT 6.3*  ALBUMIN 3.3*  AST 40  ALT 31  ALKPHOS 64  BILITOT 0.4  BILIDIR <0.1  IBILI NOT CALCULATED   PT/INR No results for input(s): LABPROT, INR in the last 72 hours.  Studies/Results: CT ABDOMEN  PELVIS WO CONTRAST Result Date: 03/11/2024 EXAM: CT ABDOMEN AND PELVIS WITHOUT CONTRAST 03/11/2024 01:50:07 PM TECHNIQUE: CT of the abdomen and pelvis was performed without the administration of intravenous contrast. Multiplanar reformatted images are provided for review. Automated exposure control, iterative reconstruction, and/or weight-based adjustment of the mA/kV was utilized to reduce the radiation dose to as low as reasonably achievable. COMPARISON: None available. CLINICAL HISTORY: Bowel obstruction suspected. Constipation. Suspected diverticulitis. Vomiting. Right upper and lower abdominal pain. FINDINGS: LOWER CHEST: No acute abnormality. LIVER: The liver is unremarkable. No portal venous gas. GALLBLADDER AND BILE DUCTS: Gallbladder is unremarkable. No biliary ductal dilatation. SPLEEN: No acute abnormality. PANCREAS: No acute abnormality. ADRENAL GLANDS: No acute abnormality. KIDNEYS, URETERS AND BLADDER: No stones in the kidneys or ureters. No hydronephrosis. No perinephric or periureteral stranding. Urinary bladder is unremarkable. GI AND BOWEL: Stomach demonstrates no acute abnormality. Prominence of stool throughout the colon. Diffuse wall thickening in the sigmoid colon although sparing the rectum without substantial surrounding pericolic stranding. There are a few scattered sigmoid colon diverticula which do not appear individually inflamed. No abscess observed. No dilated small bowel. Normal appendix. There is no bowel obstruction. PERITONEUM AND RETROPERITONEUM: No ascites. No free air. No pneumatosis. VASCULATURE: Aorta is normal in caliber. Systemic atherosclerosis is present, including the aorta and iliac arteries. LYMPH NODES: No lymphadenopathy. REPRODUCTIVE ORGANS: Uterus absent. BONES  AND SOFT TISSUES: Degenerative facet arthropathy bilaterally at L4-L5 without impingement. No acute osseous abnormality. No focal soft tissue abnormality. IMPRESSION: 1. No obstruction, although there is  abnormal prominence of stool throughout the colon leading up to the inflamed sigmoid colon. 2. Diffuse sigmoid colonic wall thickening with scattered diverticula, without diverticulitis or abscess. Appearance favors sigmoid colitis, most likely to be infectious No specific indicators of ischemic colitis or other secondary findings of inflammatory bowel disease. 3. Degenerative facet arthropathy at L4-5 without impingement. 4. Abdominal aortic atherosclerosis. Electronically signed by: Ryan Salvage MD 03/11/2024 02:28 PM EDT RP Workstation: [id number]       Assessment / Plan:    #79 68 year old African-American female admitted 03/05/2024 with right lower quadrant pain and persistent diverticulitis.  She had had CT about 1 week previously that did show sigmoid diverticulitis and had had a 7-day course of Cipro  and Flagyl  without significant improvement. Follow-up CT on 03/05/2024 showing persistent segmental wall thickening to the rectosigmoid colon compatible with diverticulitis versus colitis no evidence of perforation or abscess, marked fecal retention.  Been continued on IV metronidazole . Repeat CT on 03/11/2024 due to continued complaints of severe pain showed continued prominence of stool throughout the colon leading up to an inflamed appearing sigmoid colon and diffuse sigmoid colon wall thickening and an area of diverticuli but no active diverticulitis.  Query if she may have SCAD-segmental colitis associated with diverticulitis, versus other process.  She is now having some success with prolonged bowel prep.  She is to complete a second MiraLAX /Gatorade prep today  Plan; will plan for full liquid diet today, n.p.o. past midnight Complete Gatorade MiraLAX  prep today Continue Linzess  290 mcg daily Schedule for flexible sigmoidoscopy with Dr. Albertus for tomorrow 03/14/2024.  Procedure was discussed in detail with the patient including indications risks and benefits and she is agreeable to  proceed. Hold Lovenox  tonight     Principal Problem:   Diverticulitis     LOS: 8 days   Dayjah Selman PA-C 03/13/2024, 11:31 AM

## 2024-03-13 NOTE — Progress Notes (Signed)
 [facility]  PROGRESS NOTE  Pamela Graham FMW:[id number] DOB: 16-Jul-1955 DOA: 03/04/2024 PCP: [name] LABOR, MD   Brief HPI:    Patient with PMH of HTN, bipolar, Barrett's, migraine, fibromyalgia presents the hospital with complaints of abdominal pain. Reports that end of July she went for a colonoscopy. Since then she has some abdominal pain. Was treated for H. pylori infection. Presented to ED on 9/10 for complaints of abdominal pain and constipation. Went to see her primary GI on 9/12 who recommended CT abdomen which showed evidence of diverticulitis versus colitis.  Started on Cipro  and Flagyl  and full liquid diet. Present again to ED on 9/18 for complaints of abdominal constipation. Currently being treated for constipation.     Assessment/Plan:   Severe constipation. Recurrent abdominal pain. -Resolved -Presented with abdominal pain, CT scan showed sigmoid colon thickening with stool in proximal colon -No improvement despite aggressive bowel regimen including mag citrate, GoLytely  - GI was consulted, MiraLAX  bowel prep given on 9/23, MiraLAX  bowel prep was repeated yesterday -Had 2 large BMs today -GI considering flexible sigmoidoscopy in a.m.    Diverticulitis versus colitis. Patient had completed her oral antibiotic outpatient. Continues to have abdominal pain.  CT shows evidence of ongoing presence of diverticulitis but no evidence of perforation or rupture. Admitted for concern for failed outpatient therapy. For now we will continue with IV Flagyl  twice a day   Fibromyalgia and bipolar disorder. Home regimen includes Cymbalta  60 mg daily, Tegretol  200 mg daily, lorazepam  2 mg daily, Ambien  10 mg daily, Will continue most of the medications. Lorazepam  switched to as needed.   GERD. Continue PPI.   HTN. On amlodipine  10 mg daily    Chronic arthritis. Possible is up with orthopedic outpatient. PT OT consulted. Walker arranged.   Obesity Class 1 Body  mass index is 33.67 kg/m.  Placing the pt at higher risk of poor outcomes.    Rash. Patient  developed a rash on 9/23 She was getting ceftriaxone  which was stopped as patient has penicillin allergy -Rash seems to have resolved    Medications     amLODipine   10 mg Oral Daily   carbamazepine   200 mg Oral Daily   Chlorhexidine  Gluconate Cloth  6 each Topical Daily   dicyclomine   10 mg Oral BID   DULoxetine   60 mg Oral Daily   enoxaparin  (LOVENOX ) injection  40 mg Subcutaneous Q24H   famotidine   20 mg Oral BID   feeding supplement  1 Container Oral TID BM   hydrocortisone  cream   Topical TID   linaclotide   290 mcg Oral QAC breakfast   pantoprazole   40 mg Oral Daily   polyethylene glycol powder  238 g Oral Once   simethicone   80 mg Oral QID   sodium chloride  flush  10-40 mL Intracatheter Q12H   sodium chloride  flush  10-40 mL Intracatheter Q12H     Data Reviewed:   CBG:  No results for input(s): GLUCAP in the last 168 hours.  SpO2: 99 %    Vitals:   03/12/24 2143 03/13/24 0000 03/13/24 0443 03/13/24 0832  BP: (!) 173/105 (!) 140/97 (!) 126/94 (!) 150/94  Pulse: (!) 107  (!) 105 95  Resp:   17 18  Temp:   98.4 F (36.9 C) 98.2 F (36.8 C)  TempSrc:   Oral Oral  SpO2:   95% 99%  Weight:      Height:          Data Reviewed:  Basic  Metabolic Panel: Recent Labs  Lab 03/07/24 0332 03/08/24 0548 03/09/24 0321 03/10/24 0308 03/11/24 1225 03/12/24 0525  NA 138 137 136 138 137 133*  K 3.6 4.4 5.0 4.0 4.0 3.7  CL 106 103 101 104 101 99  CO2 23 25 27 25  21* 21*  GLUCOSE 91 94 107* 103* 94 81  BUN 6* <5* 7* 8 5* 6*  CREATININE 0.85 0.99 1.11* 1.01* 0.79 0.98  CALCIUM 8.6* 8.9 8.6* 8.7* 9.2 8.8*  MG 1.9 2.3 2.3 2.1  --   --     CBC: Recent Labs  Lab 03/07/24 0332 03/08/24 0548 03/09/24 0321 03/10/24 0308 03/11/24 1225  WBC 6.1 6.9 6.6 5.5 5.6  HGB 12.1 13.4 12.4 13.2 14.6  HCT 36.8 40.1 38.0 39.2 43.2  MCV 95.1 94.6 96.9 94.0 92.7  PLT 143*  144* 144* 199 173    LFT Recent Labs  Lab 03/11/24 1225 03/12/24 0525  AST 61* 40  ALT 35 31  ALKPHOS 71 64  BILITOT 0.6 0.4  PROT 6.9 6.3*  ALBUMIN 3.7 3.3*     Antibiotics: Anti-infectives (From admission, onward)    Start     Dose/Rate Route Frequency Ordered Stop   03/05/24 0330  cefTRIAXone  (ROCEPHIN ) 2 g in sodium chloride  0.9 % 100 mL IVPB  Status:  Discontinued        2 g 200 mL/hr over 30 Minutes Intravenous Every 24 hours 03/05/24 0322 03/07/24 1303   03/05/24 0330  metroNIDAZOLE  (FLAGYL ) IVPB 500 mg        500 mg 100 mL/hr over 60 Minutes Intravenous Every 12 hours 03/05/24 0322     03/05/24 0245  metroNIDAZOLE  (FLAGYL ) IVPB 500 mg  Status:  Discontinued        500 mg 100 mL/hr over 60 Minutes Intravenous  Once 03/05/24 0240 03/05/24 0337   03/05/24 0245  cefTRIAXone  (ROCEPHIN ) 1 g in sodium chloride  0.9 % 100 mL IVPB  Status:  Discontinued        1 g 200 mL/hr over 30 Minutes Intravenous  Once 03/05/24 0240 03/05/24 9662        DVT prophylaxis: Lovenox   Code Status: Full code  Family Communication: Discussed with patient's daughter at bedside   CONSULTS none   Subjective   Feels better today.  Had 2 BMs.  Denies abdominal pain   Objective    Physical Examination:  General-appears in no acute distress Heart-S1-S2, regular, no murmur auscultated Lungs-clear to auscultation bilaterally, no wheezing or crackles auscultated Abdomen-soft, nontender, no organomegaly Extremities-no edema in the lower extremities Neuro-alert, oriented x3, no focal deficit noted  Status is: Inpatient:             [name]   [facility] If 7PM-7AM, please contact night-coverage at [email or web address], Office  [phone]   03/13/2024, 8:45 AM  LOS: 8 days

## 2024-03-13 NOTE — Plan of Care (Signed)
  Problem: Pain Managment: Goal: General experience of comfort will improve and/or be controlled Outcome: Progressing   Problem: Safety: Goal: Ability to remain free from injury will improve Outcome: Progressing

## 2024-03-14 ENCOUNTER — Inpatient Hospital Stay (HOSPITAL_COMMUNITY): Admitting: Anesthesiology

## 2024-03-14 ENCOUNTER — Encounter (HOSPITAL_COMMUNITY): Payer: Self-pay | Admitting: Internal Medicine

## 2024-03-14 ENCOUNTER — Encounter (HOSPITAL_COMMUNITY): Admission: EM | Disposition: A | Payer: Self-pay | Source: Home / Self Care | Attending: Family Medicine

## 2024-03-14 DIAGNOSIS — R933 Abnormal findings on diagnostic imaging of other parts of digestive tract: Secondary | ICD-10-CM | POA: Diagnosis not present

## 2024-03-14 DIAGNOSIS — K529 Noninfective gastroenteritis and colitis, unspecified: Secondary | ICD-10-CM

## 2024-03-14 DIAGNOSIS — K5900 Constipation, unspecified: Secondary | ICD-10-CM | POA: Diagnosis not present

## 2024-03-14 DIAGNOSIS — R197 Diarrhea, unspecified: Secondary | ICD-10-CM

## 2024-03-14 DIAGNOSIS — F332 Major depressive disorder, recurrent severe without psychotic features: Secondary | ICD-10-CM

## 2024-03-14 DIAGNOSIS — R1031 Right lower quadrant pain: Secondary | ICD-10-CM

## 2024-03-14 DIAGNOSIS — R1084 Generalized abdominal pain: Secondary | ICD-10-CM | POA: Diagnosis not present

## 2024-03-14 DIAGNOSIS — Z87891 Personal history of nicotine dependence: Secondary | ICD-10-CM

## 2024-03-14 DIAGNOSIS — K5730 Diverticulosis of large intestine without perforation or abscess without bleeding: Secondary | ICD-10-CM

## 2024-03-14 DIAGNOSIS — I10 Essential (primary) hypertension: Secondary | ICD-10-CM

## 2024-03-14 HISTORY — PX: FLEXIBLE SIGMOIDOSCOPY: SHX5431

## 2024-03-14 HISTORY — PX: BONE BIOPSY: SHX375

## 2024-03-14 SURGERY — SIGMOIDOSCOPY, FLEXIBLE
Anesthesia: Monitor Anesthesia Care

## 2024-03-14 MED ORDER — LIDOCAINE HCL (PF) 2 % IJ SOLN
INTRAMUSCULAR | Status: DC | PRN
  Administered 2024-03-14: 40 mg via INTRADERMAL

## 2024-03-14 MED ORDER — POLYETHYLENE GLYCOL 3350 17 G PO PACK
17.0000 g | PACK | Freq: Two times a day (BID) | ORAL | Status: DC
  Administered 2024-03-14 – 2024-03-15 (×3): 17 g via ORAL
  Filled 2024-03-14 (×3): qty 1

## 2024-03-14 MED ORDER — SODIUM CHLORIDE 0.9 % IV SOLN: INTRAVENOUS | Status: DC | PRN

## 2024-03-14 MED ORDER — PROPOFOL 500 MG/50ML IV EMUL
INTRAVENOUS | Status: DC | PRN
  Administered 2024-03-14: 100 ug/kg/min via INTRAVENOUS

## 2024-03-14 MED ORDER — PROPOFOL 10 MG/ML IV BOLUS
INTRAVENOUS | Status: DC | PRN
  Administered 2024-03-14 (×4): 20 mg via INTRAVENOUS

## 2024-03-14 NOTE — Progress Notes (Signed)
 Patient left the unit going to Endo at approximately 0845

## 2024-03-14 NOTE — Op Note (Signed)
 [facility] Patient Name: Pamela Graham Procedure Date : 03/14/2024 MRN: [id number] Attending MD: Gordy CHRISTELLA Starch , MD, [id number] Date of Birth: 1955-09-11 CSN: [id number] Age: 68 Admit Type: Inpatient Procedure:                Flexible Sigmoidoscopy Indications:              Lower and right sided abdominal pain, Abnormal CT                            of the GI tract, severe constipation, colonoscopy                            in July 2025 at [facility] with Dr. Candi,                            diverticulitis versus colitis seen 02/25/24 treated                            with cipro /flagyl , persistent symptoms and abnl CT                            sigmoid (colitis) and thus this admission Providers:                Gordy CHRISTELLA. Starch, MD, Collene Edu, RN, Jasmine                            Petiford, Technician, Edsel D,CRNA Referring MD:             [facility] Medicines:                Monitored Anesthesia Care Complications:            No immediate complications. Estimated Blood Loss:     Estimated blood loss: none. Procedure:                Pre-Anesthesia Assessment:                           - Prior to the procedure, a History and Physical                            was performed, and patient medications and                            allergies were reviewed. The patient's tolerance of                            previous anesthesia was also reviewed. The risks                            and benefits of the procedure and the sedation                            options and risks were discussed with the patient.  All questions were answered, and informed consent                            was obtained. Prior Anticoagulants: The patient has                            taken no anticoagulant or antiplatelet agents. ASA                            Grade Assessment: II - A patient with mild systemic                            disease. After  reviewing the risks and benefits,                            the patient was deemed in satisfactory condition to                            undergo the procedure.                           After obtaining informed consent, the scope was                            passed under direct vision. The PCF-HQ190L                            ([id number]) Olympus colonoscope was introduced                            through the anus and advanced to the descending                            colon. The flexible sigmoidoscopy was accomplished                            without difficulty. The patient tolerated the                            procedure well. The quality of the bowel                            preparation was fair. Scope In: 9:33:24 AM Scope Out: 9:43:00 AM Total Procedure Duration: 0 hours 9 minutes 36 seconds  Findings:      The digital rectal exam was normal.      Segmental moderate mucosal changes characterized by altered vascularity,       congestion (edema), erythema and deep ulcerations were found in the mid       sigmoid colon. Biopsies were taken with a cold forceps for histology.      Multiple medium-mouthed and small-mouthed diverticula were found in the       sigmoid colon.      The retroflexed view of the distal rectum and anal verge was obscured by       copious stools. Impression:               -  Preparation of the colon was fair. Stool                            interfered with complete visualization today.                           - Segmental moderate mucosal changes were found in                            the sigmoid colon, rule out Crohn's disease.                            Biopsied.                           - Moderate diverticulosis in the sigmoid colon. Moderate Sedation:      N/A Recommendation:           - Return patient to hospital ward for ongoing care.                           - Advance diet as tolerated.                           - Await pathology  results.                           - Would continue Linzess  290 mcg daily. Would                            continue MiraLax  17 g once to twice daily as/if                            needed.                           - Patient will need follow-up with Dr. Candi,                            [facility] (her GI                            MD) after discharge.                           - Repeat colonoscopy should be considered based on                            pathology results with complete bowel preparation. Procedure Code(s):        --- Professional ---                           [phone], Sigmoidoscopy, flexible; with biopsy, single                            or multiple Diagnosis Code(s):        ---  Professional ---                           K63.89, Other specified diseases of intestine                           R10.30, Lower abdominal pain, unspecified                           K59.00, Constipation, unspecified                           K57.30, Diverticulosis of large intestine without                            perforation or abscess without bleeding                           R93.3, Abnormal findings on diagnostic imaging of                            other parts of digestive tract CPT copyright 2022 [employer]. All rights reserved. The codes documented in this report are preliminary and upon coder review may  be revised to meet current compliance requirements. Gordy CHRISTELLA Starch, MD 03/14/2024 9:57:01 AM This report has been signed electronically. Number of Addenda: 0

## 2024-03-14 NOTE — Progress Notes (Signed)
 [facility]  PROGRESS NOTE  Pamela Graham FMW:[id number] DOB: 1955-10-28 DOA: 03/04/2024 PCP: [name] LABOR, MD   Brief HPI:    Patient with PMH of HTN, bipolar, Barrett's, migraine, fibromyalgia presents the hospital with complaints of abdominal pain. Reports that end of July she went for a colonoscopy. Since then she has some abdominal pain. Was treated for H. pylori infection. Presented to ED on 9/10 for complaints of abdominal pain and constipation. Went to see her primary GI on 9/12 who recommended CT abdomen which showed evidence of diverticulitis versus colitis.  Started on Cipro  and Flagyl  and full liquid diet. Present again to ED on 9/18 for complaints of abdominal constipation. Currently being treated for constipation.     Assessment/Plan:   Severe constipation. Recurrent abdominal pain. -Resolved -Presented with abdominal pain, CT scan showed sigmoid colon thickening with stool in proximal colon -No improvement despite aggressive bowel regimen including mag citrate, GoLytely  - GI was consulted, MiraLAX  bowel prep given on 9/23, MiraLAX  bowel prep was repeated yesterday -Had 2 large BMs on 9/27 --Underwent flexible sigmoidoscopy this morning which showed moderate diverticulosis and mucosal changes in the sigmoid colon; rule out Crohn's disease.  Biopsy obtained  Diverticulitis versus colitis. Patient had completed her oral antibiotic outpatient. Continues to have abdominal pain.  CT shows evidence of ongoing presence of diverticulitis but no evidence of perforation or rupture. Admitted for concern for failed outpatient therapy. For now we will continue with IV Flagyl  twice a day   Fibromyalgia and bipolar disorder. Home regimen includes Cymbalta  60 mg daily, Tegretol  200 mg daily, lorazepam  2 mg daily, Ambien  10 mg daily, Will continue most of the medications. Lorazepam  switched to as needed.   GERD. Continue PPI.   HTN. On amlodipine  10 mg daily     Chronic arthritis. Possible is up with orthopedic outpatient. PT OT consulted. Walker arranged.   Obesity Class 1 Body mass index is 33.67 kg/m.  Placing the pt at higher risk of poor outcomes.    Rash. Patient  developed a rash on 9/23 She was getting ceftriaxone  which was stopped as patient has penicillin allergy -Rash seems to have resolved    Medications     amLODipine   10 mg Oral Daily   carbamazepine   200 mg Oral Daily   Chlorhexidine  Gluconate Cloth  6 each Topical Daily   dicyclomine   10 mg Oral BID   DULoxetine   60 mg Oral Daily   enoxaparin  (LOVENOX ) injection  40 mg Subcutaneous Q24H   famotidine   20 mg Oral BID   feeding supplement  1 Container Oral TID BM   hydrocortisone  cream   Topical TID   linaclotide   290 mcg Oral QAC breakfast   pantoprazole   40 mg Oral Daily   simethicone   80 mg Oral QID   sodium chloride  flush  10-40 mL Intracatheter Q12H   sodium chloride  flush  10-40 mL Intracatheter Q12H     Data Reviewed:   CBG:  No results for input(s): GLUCAP in the last 168 hours.  SpO2: 97 %    Vitals:   03/13/24 0443 03/13/24 0832 03/13/24 2104 03/14/24 0515  BP: (!) 126/94 (!) 150/94 (!) 149/105 (!) 142/110  Pulse: (!) 105 95 (!) 107 (!) 109  Resp: 17 18 18 18   Temp: 98.4 F (36.9 C) 98.2 F (36.8 C) 98.4 F (36.9 C) 98.2 F (36.8 C)  TempSrc: Oral Oral Oral Oral  SpO2: 95% 99% 97% 97%  Weight:      Height:  Data Reviewed:  Basic Metabolic Panel: Recent Labs  Lab 03/08/24 0548 03/09/24 0321 03/10/24 0308 03/11/24 1225 03/12/24 0525  NA 137 136 138 137 133*  K 4.4 5.0 4.0 4.0 3.7  CL 103 101 104 101 99  CO2 25 27 25  21* 21*  GLUCOSE 94 107* 103* 94 81  BUN <5* 7* 8 5* 6*  CREATININE 0.99 1.11* 1.01* 0.79 0.98  CALCIUM 8.9 8.6* 8.7* 9.2 8.8*  MG 2.3 2.3 2.1  --   --     CBC: Recent Labs  Lab 03/08/24 0548 03/09/24 0321 03/10/24 0308 03/11/24 1225  WBC 6.9 6.6 5.5 5.6  HGB 13.4 12.4 13.2 14.6  HCT 40.1  38.0 39.2 43.2  MCV 94.6 96.9 94.0 92.7  PLT 144* 144* 199 173    LFT Recent Labs  Lab 03/11/24 1225 03/12/24 0525  AST 61* 40  ALT 35 31  ALKPHOS 71 64  BILITOT 0.6 0.4  PROT 6.9 6.3*  ALBUMIN 3.7 3.3*     Antibiotics: Anti-infectives (From admission, onward)    Start     Dose/Rate Route Frequency Ordered Stop   03/05/24 0330  cefTRIAXone  (ROCEPHIN ) 2 g in sodium chloride  0.9 % 100 mL IVPB  Status:  Discontinued        2 g 200 mL/hr over 30 Minutes Intravenous Every 24 hours 03/05/24 0322 03/07/24 1303   03/05/24 0330  metroNIDAZOLE  (FLAGYL ) IVPB 500 mg        500 mg 100 mL/hr over 60 Minutes Intravenous Every 12 hours 03/05/24 0322     03/05/24 0245  metroNIDAZOLE  (FLAGYL ) IVPB 500 mg  Status:  Discontinued        500 mg 100 mL/hr over 60 Minutes Intravenous  Once 03/05/24 0240 03/05/24 0337   03/05/24 0245  cefTRIAXone  (ROCEPHIN ) 1 g in sodium chloride  0.9 % 100 mL IVPB  Status:  Discontinued        1 g 200 mL/hr over 30 Minutes Intravenous  Once 03/05/24 0240 03/05/24 9662        DVT prophylaxis: Lovenox   Code Status: Full code  Family Communication: Discussed with patient's daughter at bedside   CONSULTS none   Subjective   Patient seen and examined.  Feels much better this morning.  Underwent flexible sigmoidoscopy   Objective    Physical Examination:  General-appears in no acute distress Heart-S1-S2, regular, no murmur auscultated Lungs-clear to auscultation bilaterally, no wheezing or crackles auscultated Abdomen-soft, nontender, no organomegaly Extremities-no edema in the lower extremities Neuro-alert, oriented x3, no focal deficit noted   Status is: Inpatient:             Pamela Graham S Pamela Graham   [facility] If 7PM-7AM, please contact night-coverage at [email or web address], Office  [phone]   03/14/2024, 8:31 AM  LOS: 9 days

## 2024-03-14 NOTE — Anesthesia Procedure Notes (Signed)
 Procedure Name: MAC Date/Time: 03/14/2024 9:29 AM  Performed by: Arvell Edsel HERO, CRNAPre-anesthesia Checklist: Patient identified, Suction available, Patient being monitored, Timeout performed and Emergency Drugs available Patient Re-evaluated:Patient Re-evaluated prior to induction Oxygen Delivery Method: Simple face mask

## 2024-03-14 NOTE — Transfer of Care (Signed)
 Immediate Anesthesia Transfer of Care Note  Patient: Pamela Graham  Procedure(s) Performed: SIGMOIDOSCOPY, FLEXIBLE BIOPSY, GI  Patient Location: Endoscopy Unit  Anesthesia Type:MAC  Level of Consciousness: drowsy and patient cooperative  Airway & Oxygen Therapy: Patient Spontanous Breathing and Patient connected to face mask oxygen  Post-op Assessment: Report given to RN, Post -op Vital signs reviewed and stable, Patient moving all extremities, and Patient moving all extremities X 4  Post vital signs: Reviewed and stable  Last Vitals:  Vitals Value Taken Time  BP 133/91 03/11/24 0950  Temp  03/14/24 0950  Pulse 92 03/14/24 0950  Resp 17 03/14/24 09:50  SpO2 100   Vitals shown include unfiled device data.  Last Pain:  Vitals:   03/14/24 0908  TempSrc: Temporal  PainSc: 4       Patients Stated Pain Goal: 0 (03/05/24 0330)  Complications: No notable events documented.

## 2024-03-14 NOTE — Anesthesia Preprocedure Evaluation (Signed)
 Anesthesia Evaluation  Patient identified by MRN, date of birth, ID band Patient awake    Reviewed: Allergy & Precautions, NPO status , Patient's Chart, lab work & pertinent test results, reviewed documented beta blocker date and time   History of Anesthesia Complications Negative for: history of anesthetic complications  Airway Mallampati: II  TM Distance: >3 FB     Dental no notable dental hx.    Pulmonary former smoker   breath sounds clear to auscultation       Cardiovascular hypertension, (-) CAD, (-) Past MI, (-) Cardiac Stents and (-) CABG  Rhythm:Regular Rate:Tachycardia     Neuro/Psych  Headaches PSYCHIATRIC DISORDERS  Depression Bipolar Disorder    Neuromuscular disease    GI/Hepatic PUD,,,(+) neg Cirrhosis        Endo/Other    Renal/GU Renal disease     Musculoskeletal  (+)  Fibromyalgia -  Abdominal   Peds  Hematology   Anesthesia Other Findings   Reproductive/Obstetrics                              Anesthesia Physical Anesthesia Plan  ASA: 2  Anesthesia Plan: MAC   Post-op Pain Management:    Induction: Intravenous  PONV Risk Score and Plan: 2 and Ondansetron   Airway Management Planned: Simple Face Mask and Natural Airway  Additional Equipment:   Intra-op Plan:   Post-operative Plan: Extubation in OR  Informed Consent: I have reviewed the patients History and Physical, chart, labs and discussed the procedure including the risks, benefits and alternatives for the proposed anesthesia with the patient or authorized representative who has indicated his/her understanding and acceptance.     Dental advisory given  Plan Discussed with: CRNA  Anesthesia Plan Comments:          Anesthesia Quick Evaluation

## 2024-03-14 NOTE — Anesthesia Postprocedure Evaluation (Signed)
 Anesthesia Post Note  Patient: Pamela Graham  Procedure(s) Performed: SIGMOIDOSCOPY, FLEXIBLE BIOPSY, GI     Patient location during evaluation: PACU Anesthesia Type: MAC Level of consciousness: awake and alert Pain management: pain level controlled Vital Signs Assessment: post-procedure vital signs reviewed and stable Respiratory status: spontaneous breathing, nonlabored ventilation, respiratory function stable and patient connected to nasal cannula oxygen Cardiovascular status: stable and blood pressure returned to baseline Postop Assessment: no apparent nausea or vomiting Anesthetic complications: no   No notable events documented.  Last Vitals:  Vitals:   03/14/24 1000 03/14/24 1037  BP: 119/75 (!) 145/98  Pulse: 97 94  Resp: 20 17  Temp:  (!) 36.4 C  SpO2: 95% 100%    Last Pain:  Vitals:   03/14/24 0955  TempSrc:   PainSc: 0-No pain                 Lynwood MARLA Cornea

## 2024-03-15 ENCOUNTER — Encounter (HOSPITAL_COMMUNITY): Payer: Self-pay | Admitting: Internal Medicine

## 2024-03-15 ENCOUNTER — Other Ambulatory Visit (HOSPITAL_COMMUNITY): Payer: Self-pay

## 2024-03-15 DIAGNOSIS — K5900 Constipation, unspecified: Secondary | ICD-10-CM | POA: Diagnosis not present

## 2024-03-15 DIAGNOSIS — K5792 Diverticulitis of intestine, part unspecified, without perforation or abscess without bleeding: Secondary | ICD-10-CM | POA: Diagnosis not present

## 2024-03-15 DIAGNOSIS — R933 Abnormal findings on diagnostic imaging of other parts of digestive tract: Secondary | ICD-10-CM | POA: Diagnosis not present

## 2024-03-15 MED ORDER — LINACLOTIDE 290 MCG PO CAPS: 290.0000 ug | ORAL_CAPSULE | Freq: Every day | ORAL | 1 refills | Status: AC

## 2024-03-15 NOTE — Progress Notes (Signed)
   03/15/24 1035  Vitals  BP (!) 133/95  BP Location Right Arm  BP Method Automatic  Patient Position (if appropriate) Sitting  Pulse Rate 100  Pulse Rate Source Dinamap  Resp 17  MEWS COLOR  MEWS Score Color Green  Oxygen Therapy  SpO2 99 %  O2 Device Room Air  Pain Assessment  Pain Scale 0-10  Pain Score 2  MEWS Score  MEWS Temp 0  MEWS Systolic 0  MEWS Pulse 0  MEWS RR 0  MEWS LOC 0  MEWS Score 0

## 2024-03-15 NOTE — Progress Notes (Signed)
   03/15/24 0453  Vitals  Temp 98 F (36.7 C)  Temp Source Oral  BP 119/80  MAP (mmHg) 93  BP Method Automatic  Pulse Rate 98  Pulse Rate Source Monitor  Level of Consciousness  Level of Consciousness Alert  MEWS COLOR  MEWS Score Color Green  Oxygen Therapy  SpO2 97 %  Pain Assessment  Pain Scale 0-10  Pain Score 2  Pain Location Abdomen  Pain Descriptors / Indicators Cramping  MEWS Score  MEWS Temp 0  MEWS Systolic 0  MEWS Pulse 0  MEWS RR 0  MEWS LOC 0  MEWS Score 0

## 2024-03-15 NOTE — Progress Notes (Signed)
 Mobility Specialist Progress Note:    03/15/24 1513  Mobility  Activity Ambulated with assistance (In hallway)  Level of Assistance Standby assist, set-up cues, supervision of patient - no hands on  Assistive Device Front wheel walker  Distance Ambulated (ft) 510 ft (x3)  Activity Response Tolerated well  Mobility Referral Yes  Mobility visit 1 Mobility  Mobility Specialist Start Time (ACUTE ONLY) 1320  Mobility Specialist Stop Time (ACUTE ONLY) 1350  Mobility Specialist Time Calculation (min) (ACUTE ONLY) 30 min   Received pt in bed and eager for mobility. No physical assistance required. No c/o. Pt returned to room without fault. Left EOB with personal belongings and call light within reach. All needs met.  Lavanda Pollack Mobility Specialist  Please contact via [employer] or  Rehab Office [phone]

## 2024-03-15 NOTE — Progress Notes (Incomplete)
 [facility]  PROGRESS NOTE  Pamela Graham FMW:[id number] DOB: Nov 16, 1955 DOA: 03/04/2024 PCP: Pamela Graham   Brief HPI:    Patient with PMH of HTN, bipolar, Barrett's, migraine, fibromyalgia presents the hospital with complaints of abdominal pain. Reports that end of July she went for a colonoscopy. Since then she has some abdominal pain. Was treated for H. pylori infection. Presented to ED on 9/10 for complaints of abdominal pain and constipation. Went to see her primary GI on 9/12 who recommended CT abdomen which showed evidence of diverticulitis versus colitis.  Started on Cipro  and Flagyl  and full liquid diet. Present again to ED on 9/18 for complaints of abdominal constipation. Currently being treated for constipation.     Assessment/Plan:   Severe constipation. Recurrent abdominal pain. -Resolved -Presented with abdominal pain, CT scan showed sigmoid colon thickening with stool in proximal colon -No improvement despite aggressive bowel regimen including mag citrate, GoLytely  - GI was consulted, MiraLAX  bowel prep given on 9/23, MiraLAX  bowel prep was repeated yesterday -Had 2 large BMs on 9/27 --Underwent flexible sigmoidoscopy this morning which showed moderate diverticulosis and mucosal changes in the sigmoid colon; rule out Crohn's disease.  Biopsy obtained  Diverticulitis versus colitis. Patient had completed her oral antibiotic outpatient. Continues to have abdominal pain.  CT shows evidence of ongoing presence of diverticulitis but no evidence of perforation or rupture. Admitted for concern for failed outpatient therapy. For now we will continue with IV Flagyl  twice a day   Fibromyalgia and bipolar disorder. Home regimen includes Cymbalta  60 mg daily, Tegretol  200 mg daily, lorazepam  2 mg daily, Ambien  10 mg daily, Will continue most of the medications. Lorazepam  switched to as needed.   GERD. Continue PPI.   HTN. On amlodipine  10 mg daily     Chronic arthritis. Possible is up with orthopedic outpatient. PT OT consulted. Walker arranged.   Obesity Class 1 Body mass index is 33.67 kg/m.  Placing the pt at higher risk of poor outcomes.    Rash. Patient  developed a rash on 9/23 She was getting ceftriaxone  which was stopped as patient has penicillin allergy -Rash seems to have resolved    Medications     amLODipine   10 mg Oral Daily   carbamazepine   200 mg Oral Daily   Chlorhexidine  Gluconate Cloth  6 each Topical Daily   dicyclomine   10 mg Oral BID   DULoxetine   60 mg Oral Daily   enoxaparin  (LOVENOX ) injection  40 mg Subcutaneous Q24H   famotidine   20 mg Oral BID   feeding supplement  1 Container Oral TID BM   hydrocortisone  cream   Topical TID   linaclotide   290 mcg Oral QAC breakfast   pantoprazole   40 mg Oral Daily   polyethylene glycol  17 g Oral BID   simethicone   80 mg Oral QID   sodium chloride  flush  10-40 mL Intracatheter Q12H   sodium chloride  flush  10-40 mL Intracatheter Q12H     Data Reviewed:   CBG:  No results for input(s): GLUCAP in the last 168 hours.  SpO2: 97 % O2 Flow Rate (L/min): 6 L/min    Vitals:   03/14/24 1037 03/14/24 1711 03/14/24 2042 03/15/24 0453  BP: (!) 145/98 (!) 166/96 (!) 127/97 119/80  Pulse: 94 (!) 104 (!) 110 98  Resp: 17 14    Temp: (!) 97.5 F (36.4 C) 98.7 F (37.1 C) 98 F (36.7 C) 98 F (36.7 C)  TempSrc:  Oral Oral Oral  SpO2: 100% 100% 96% 97%  Weight:      Height:          Data Reviewed:  Basic Metabolic Panel: Recent Labs  Lab 03/09/24 0321 03/10/24 0308 03/11/24 1225 03/12/24 0525  NA 136 138 137 133*  K 5.0 4.0 4.0 3.7  CL 101 104 101 99  CO2 27 25 21* 21*  GLUCOSE 107* 103* 94 81  BUN 7* 8 5* 6*  CREATININE 1.11* 1.01* 0.79 0.98  CALCIUM 8.6* 8.7* 9.2 8.8*  MG 2.3 2.1  --   --     CBC: Recent Labs  Lab 03/09/24 0321 03/10/24 0308 03/11/24 1225  WBC 6.6 5.5 5.6  HGB 12.4 13.2 14.6  HCT 38.0 39.2 43.2  MCV 96.9  94.0 92.7  PLT 144* 199 173    LFT Recent Labs  Lab 03/11/24 1225 03/12/24 0525  AST 61* 40  ALT 35 31  ALKPHOS 71 64  BILITOT 0.6 0.4  PROT 6.9 6.3*  ALBUMIN 3.7 3.3*     Antibiotics: Anti-infectives (From admission, onward)    Start     Dose/Rate Route Frequency Ordered Stop   03/05/24 0330  cefTRIAXone  (ROCEPHIN ) 2 g in sodium chloride  0.9 % 100 mL IVPB  Status:  Discontinued        2 g 200 mL/hr over 30 Minutes Intravenous Every 24 hours 03/05/24 0322 03/07/24 1303   03/05/24 0330  metroNIDAZOLE  (FLAGYL ) IVPB 500 mg  Status:  Discontinued        500 mg 100 mL/hr over 60 Minutes Intravenous Every 12 hours 03/05/24 0322 03/14/24 0958   03/05/24 0245  metroNIDAZOLE  (FLAGYL ) IVPB 500 mg  Status:  Discontinued        500 mg 100 mL/hr over 60 Minutes Intravenous  Once 03/05/24 0240 03/05/24 0337   03/05/24 0245  cefTRIAXone  (ROCEPHIN ) 1 g in sodium chloride  0.9 % 100 mL IVPB  Status:  Discontinued        1 g 200 mL/hr over 30 Minutes Intravenous  Once 03/05/24 0240 03/05/24 9662        DVT prophylaxis: Lovenox   Code Status: Full code  Family Communication: Discussed with patient's daughter at bedside   CONSULTS none   Subjective      Objective    Physical Examination:    Status is: Inpatient:             Pamela Graham   [facility] If 7PM-7AM, please contact night-coverage at [email or web address], Office  [phone]   03/15/2024, 8:22 AM  LOS: 10 days

## 2024-03-15 NOTE — Discharge Summary (Signed)
 Physician Discharge Summary   Patient: Pamela Graham MRN: [id number] DOB: 15-Jul-1955  Admit date:     03/04/2024  Discharge date: 03/15/24  Discharge Physician: [name]   PCP: [name] LABOR, MD   Recommendations at discharge:   Follow-up gastroenterology as outpatient  Discharge Diagnoses: Principal Problem:   Diverticulitis Active Problems:   Noninfectious gastroenteritis   Abnormal CT scan, sigmoid colon   Constipation  Resolved Problems:   * No resolved hospital problems. [facility] Course: This patient with PMH of HTN, bipolar, Barrett's, migraine, fibromyalgia presents the hospital with complaints of abdominal pain. Reports that end of July she went for a colonoscopy. Since then she has some abdominal pain. Was treated for H. pylori infection. Presented to ED on 9/10 for complaints of abdominal pain and constipation. Went to see her primary GI on 9/12 who recommended CT abdomen which showed evidence of diverticulitis versus colitis.  Started on Cipro  and Flagyl  and full liquid diet. Present again to ED on 9/18 for complaints of abdominal constipation. Currently being treated for constipation.  Assessment and Plan:  Severe constipation/Recurrent abdominal pain. -Resolved -Presented with abdominal pain, CT scan showed sigmoid colon thickening with stool in proximal colon -No improvement despite aggressive bowel regimen including mag citrate, GoLytely  - GI was consulted, MiraLAX  bowel prep given on 9/23, MiraLAX  bowel prep was repeated yesterday -Had 2 large BMs on 9/27 --Underwent flexible sigmoidoscopy this morning which showed moderate diverticulosis and mucosal changes in the sigmoid colon; rule out Crohn's disease.  Biopsy obtained - Will discharge her home on Linzess  as per GI recommendation -Will follow-up with gastroenterology as outpatient   Diverticulitis versus colitis. Patient had completed her oral antibiotic outpatient. Continues to have  abdominal pain.  CT shows evidence of ongoing presence of diverticulitis but no evidence of perforation or rupture. Admitted for concern for failed outpatient therapy. She was treated with IV Flagyl  in the hospital. -Symptoms have resolved.  Patient tolerating diet well.   Fibromyalgia and bipolar disorder. Home regimen includes Cymbalta  60 mg daily, Tegretol  200 mg daily, lorazepam  2 mg daily, Ambien  10 mg daily, Will continue most of the medications. Lorazepam  switched to as needed.   GERD. Continue PPI.   HTN. On amlodipine  10 mg daily    Chronic arthritis. Possible is up with orthopedic outpatient. PT OT consulted. Walker arranged.   Obesity Class 1 Body mass index is 33.67 kg/m.  Placing the pt at higher risk of poor outcomes.    Rash. Patient  developed a rash on 9/23 She was getting ceftriaxone  which was stopped as patient has penicillin allergy      Consultants: Gastroenterology Procedures performed: Flexible sigmoidoscopy Disposition: Home Diet recommendation:  Discharge Diet Orders (From admission, onward)     Start     Ordered   03/15/24 0000  Diet - low sodium heart healthy        03/15/24 1428           Regular diet DISCHARGE MEDICATION: Allergies as of 03/15/2024       Reactions   Haloperidol Shortness Of Breath, Itching, Rash   Lisinopril Shortness Of Breath   Other Other (See Comments)   Steroids- Patient stated she cannot have these because of 3rd stage kidney disease   Pregabalin Other (See Comments), Palpitations, Rash   Tachycardia   Risperidone And Paliperidone Shortness Of Breath, Itching, Rash   Gabapentin Other (See Comments), Swelling   Gastrointestinal upset (Barrett's Esophagus irritation) Other Reaction(s): GI Intolerance  Ibuprofen Rash   Meloxicam Rash, Other (See Comments)   Renal insufficiency   Naproxen  Other (See Comments)   GI Intolerance/Stomach pain   Penicillins Hives, Swelling   Has patient had a PCN reaction  causing immediate rash, facial/tongue/throat swelling, SOB or lightheadedness with hypotension: Yes Has patient had a PCN reaction causing severe rash involving mucus membranes or skin necrosis: No Has patient had a PCN reaction that required hospitalization: No Has patient had a PCN reaction occurring within the last 10 years: No If all of the above answers are NO, then may proceed with Cephalosporin use.   Tramadol Rash   Ceftriaxone  Itching        Medication List     TAKE these medications    amLODipine  10 MG tablet Commonly known as: NORVASC  Take 10 mg by mouth daily.   carbamazepine  200 MG 12 hr tablet Commonly known as: TEGRETOL  XR Take 200 mg by mouth 2 (two) times daily.   cyclobenzaprine  10 MG tablet Commonly known as: FLEXERIL  Take 10 mg by mouth 2 (two) times daily as needed for muscle spasms.   DULoxetine  60 MG capsule Commonly known as: CYMBALTA  Take 60 mg by mouth daily.   fluticasone 50 MCG/ACT nasal spray Commonly known as: FLONASE Place 1 spray into both nostrils daily as needed for allergies.   Lactulose  20 GM/30ML Soln Take 30 mLs (20 g total) by mouth daily as needed (constipation).   linaclotide  290 MCG Caps capsule Commonly known as: LINZESS  Take 1 capsule (290 mcg total) by mouth daily before breakfast. Start taking on: March 16, 2024   loratadine 10 MG tablet Commonly known as: CLARITIN Take 10 mg by mouth daily as needed for allergies or rhinitis.   Loreev XR  2 MG Cs24 Generic drug: LORazepam  ER Take 1 capsule by mouth daily.   naloxone 4 MG/0.1ML Liqd nasal spray kit Commonly known as: NARCAN Place 1 spray into the nose.   omeprazole 40 MG capsule Commonly known as: PRILOSEC Take 40 mg by mouth daily before breakfast.   ondansetron  4 MG tablet Commonly known as: ZOFRAN  Take 4 mg by mouth every 8 (eight) hours as needed.   Systane Complete PF 0.6 % Soln Generic drug: Propylene Glycol (PF) Apply 2 drops to eye in the  morning and at bedtime.   zolpidem  10 MG tablet Commonly known as: AMBIEN  Take 10 mg by mouth at bedtime as needed.               Durable Medical Equipment  (From admission, onward)           Start     Ordered   03/05/24 1332  For home use only DME Walker rolling  Once       Question Answer Comment  Walker: With 5 Inch Wheels   Patient needs a walker to treat with the following condition Weakness      03/05/24 1332            Follow-up Information     [facility] Follow up.   Specialty: [facility] Contact information: [address] [place] [address] [place] [address] [phone]         Albertus Gordy HERO, MD. Schedule an appointment as soon as possible for a visit.   Specialty: Gastroenterology Contact information: [address] [place] [address] [phone]         [name] LABOR, MD Follow up in 2 week(s).   Specialty: Family Medicine Contact information:  [address] [place] [address] [phone]                Discharge Exam: Pamela Graham   03/04/24 1559  Weight: 97.5 kg   General-appears in no acute distress Heart-S1-S2, regular, no murmur auscultated Lungs-clear to auscultation bilaterally, no wheezing or crackles auscultated Abdomen-soft, nontender, no organomegaly Extremities-no edema in the lower extremities Neuro-alert, oriented x3, no focal deficit noted  Condition at discharge: good  The results of significant diagnostics from this hospitalization (including imaging, microbiology, ancillary and laboratory) are listed below for reference.   Imaging Studies: CT ABDOMEN PELVIS WO CONTRAST Result Date: 03/11/2024 EXAM: CT ABDOMEN AND PELVIS WITHOUT CONTRAST 03/11/2024 01:50:07 PM TECHNIQUE: CT of the abdomen and pelvis was performed without the administration of intravenous contrast. Multiplanar reformatted images are provided for review. Automated exposure control, iterative  reconstruction, and/or weight-based adjustment of the mA/kV was utilized to reduce the radiation dose to as low as reasonably achievable. COMPARISON: None available. CLINICAL HISTORY: Bowel obstruction suspected. Constipation. Suspected diverticulitis. Vomiting. Right upper and lower abdominal pain. FINDINGS: LOWER CHEST: No acute abnormality. LIVER: The liver is unremarkable. No portal venous gas. GALLBLADDER AND BILE DUCTS: Gallbladder is unremarkable. No biliary ductal dilatation. SPLEEN: No acute abnormality. PANCREAS: No acute abnormality. ADRENAL GLANDS: No acute abnormality. KIDNEYS, URETERS AND BLADDER: No stones in the kidneys or ureters. No hydronephrosis. No perinephric or periureteral stranding. Urinary bladder is unremarkable. GI AND BOWEL: Stomach demonstrates no acute abnormality. Prominence of stool throughout the colon. Diffuse wall thickening in the sigmoid colon although sparing the rectum without substantial surrounding pericolic stranding. There are a few scattered sigmoid colon diverticula which do not appear individually inflamed. No abscess observed. No dilated small bowel. Normal appendix. There is no bowel obstruction. PERITONEUM AND RETROPERITONEUM: No ascites. No free air. No pneumatosis. VASCULATURE: Aorta is normal in caliber. Systemic atherosclerosis is present, including the aorta and iliac arteries. LYMPH NODES: No lymphadenopathy. REPRODUCTIVE ORGANS: Uterus absent. BONES AND SOFT TISSUES: Degenerative facet arthropathy bilaterally at L4-L5 without impingement. No acute osseous abnormality. No focal soft tissue abnormality. IMPRESSION: 1. No obstruction, although there is abnormal prominence of stool throughout the colon leading up to the inflamed sigmoid colon. 2. Diffuse sigmoid colonic wall thickening with scattered diverticula, without diverticulitis or abscess. Appearance favors sigmoid colitis, most likely to be infectious No specific indicators of ischemic colitis or other  secondary findings of inflammatory bowel disease. 3. Degenerative facet arthropathy at L4-5 without impingement. 4. Abdominal aortic atherosclerosis. Electronically signed by: Ryan Salvage MD 03/11/2024 02:28 PM EDT RP Workstation: [id number]   DG Abd 2 Views Result Date: 03/10/2024 CLINICAL DATA:  Constipation EXAM: ABDOMEN - 2 VIEW COMPARISON:  March 08, 2024 FINDINGS: Nonobstructive bowel gas pattern. Persistent moderate colonic stool burden. Pelvic phleboliths. No acute osseous findings. No free air. IMPRESSION: Nonobstructive bowel gas pattern. Persistent moderate colonic stool burden. Electronically Signed   By: Michaeline Blanch M.D.   On: 03/10/2024 14:45   DG Abd Portable 1V Result Date: 03/08/2024 CLINICAL DATA:  [id number] Constipated [id number] EXAM: PORTABLE ABDOMEN - 1 VIEW COMPARISON:  03/06/2024 FINDINGS: Nonobstructive bowel gas pattern.Moderate volume fecal loading within the right colon.No pneumoperitoneum. No organomegaly or radiopaque calculi. No acute fracture or destructive lesion. Streaky left basilar atelectasis with elevation of the left hemidiaphragm.Multilevel degenerative disc disease of the spine. IMPRESSION: Nonobstructive bowel gas pattern. Persistent, moderate volume fecal loading within the right colon, as can be seen in constipation. Electronically Signed   By: Rogelia  Carlean M.D.   On: 03/08/2024 08:59   DG Abd 2 Views Result Date: 03/06/2024 CLINICAL DATA:  Constipation. EXAM: ABDOMEN - 2 VIEW COMPARISON:  None Available. FINDINGS: The bowel gas pattern is normal. There is a large amount of stool throughout the colon. There is no evidence of free air. No radio-opaque calculi or other significant radiographic abnormality is seen. IMPRESSION: Large amount of stool throughout the colon. Electronically Signed   By: Greig Pique M.D.   On: 03/06/2024 17:35   CT ABDOMEN PELVIS W CONTRAST Result Date: 03/04/2024 CLINICAL DATA:  Right lower quadrant abdominal pain, dizziness,  nausea EXAM: CT ABDOMEN AND PELVIS WITH CONTRAST TECHNIQUE: Multidetector CT imaging of the abdomen and pelvis was performed using the standard protocol following bolus administration of intravenous contrast. RADIATION DOSE REDUCTION: This exam was performed according to the departmental dose-optimization program which includes automated exposure control, adjustment of the mA and/or kV according to patient size and/or use of iterative reconstruction technique. CONTRAST:  75mL OMNIPAQUE  IOHEXOL  350 MG/ML SOLN COMPARISON:  02/25/2024 FINDINGS: Lower chest: No acute pleural or parenchymal lung disease. Hepatobiliary: Hepatic steatosis. No focal liver abnormality is seen. No gallstones, gallbladder wall thickening, or biliary dilatation. Pancreas: Unremarkable. No pancreatic ductal dilatation or surrounding inflammatory changes. Spleen: Normal in size without focal abnormality. Adrenals/Urinary Tract: Adrenal glands are unremarkable. Kidneys are normal, without renal calculi, focal lesion, or hydronephrosis. Bladder is unremarkable. Stomach/Bowel: No bowel obstruction or ileus. There is persistent segmental wall thickening and pericolonic fat stranding involving the rectosigmoid colon, consistent with diverticulitis or colitis. No perforation, fluid collection, or abscess. Significant retained stool within the more proximal colon consistent with constipation. Normal gas-filled appendix right mid abdomen. Vascular/Lymphatic: Aortic atherosclerosis. No enlarged abdominal or pelvic lymph nodes. Reproductive: Status post hysterectomy. No adnexal masses. Other: No free fluid or free intraperitoneal gas. No abdominal wall hernia. Musculoskeletal: No acute or destructive bony abnormalities. Reconstructed images demonstrate no additional findings. IMPRESSION: 1. Persistent segmental wall thickening of the rectosigmoid colon, compatible with diverticulitis or colitis. No perforation, fluid collection, or abscess. 2. Persistent  marked fecal retention within the more proximal colon, consistent with constipation. No bowel obstruction or ileus. 3. Hepatic steatosis. 4.  Aortic Atherosclerosis (ICD10-I70.0). Electronically Signed   By: Ozell Daring M.D.   On: 03/04/2024 18:00   CT ABDOMEN PELVIS W CONTRAST Result Date: 02/25/2024 CLINICAL DATA:  Generalized abdominal pain since Thursday, decreased appetite EXAM: CT ABDOMEN AND PELVIS WITH CONTRAST TECHNIQUE: Multidetector CT imaging of the abdomen and pelvis was performed using the standard protocol following bolus administration of intravenous contrast. RADIATION DOSE REDUCTION: This exam was performed according to the departmental dose-optimization program which includes automated exposure control, adjustment of the mA and/or kV according to patient size and/or use of iterative reconstruction technique. CONTRAST:  75mL OMNIPAQUE  IOHEXOL  350 MG/ML SOLN COMPARISON:  07/03/2020 FINDINGS: Lower chest: Linear subpleural scarring within the lingular segment of the left upper lobe. No acute pleural or parenchymal lung disease. Hepatobiliary: Mild decreased attenuation of the liver parenchyma consistent with hepatic steatosis. No focal liver abnormality. The gallbladder is unremarkable. No biliary duct dilation. Pancreas: Unremarkable. No pancreatic ductal dilatation or surrounding inflammatory changes. Spleen: Normal in size without focal abnormality. Adrenals/Urinary Tract: Adrenal glands are unremarkable. Kidneys are normal, without renal calculi, focal lesion, or hydronephrosis. Bladder is decompressed, limiting its evaluation. Stomach/Bowel: No evidence of bowel obstruction or ileus. There is moderate mural thickening throughout the sigmoid colon, which may reflect colitis or diverticulitis. The large amount of retained  stool is seen within the more proximal colon, consistent with constipation. Normal appendix right lower quadrant. Small hiatal hernia. Vascular/Lymphatic: Aortic  atherosclerosis. No enlarged abdominal or pelvic lymph nodes. Reproductive: Status post hysterectomy. No adnexal masses. Other: No free fluid or free intraperitoneal gas. No abdominal wall hernia. Musculoskeletal: No acute or destructive bony abnormalities. Reconstructed images demonstrate no additional findings. IMPRESSION: 1. Long segment sigmoid colonic wall thickening, compatible with sigmoid diverticulitis or colitis. No perforation, fluid collection, or abscess. 2. Marked fecal retention within the more proximal colon, consistent with constipation. No bowel obstruction or ileus. 3. Hepatic steatosis. 4. Small hiatal hernia. 5.  Aortic Atherosclerosis (ICD10-I70.0). Electronically Signed   By: Ozell Daring M.D.   On: 02/25/2024 16:45    Microbiology: Results for orders placed or performed during the hospital encounter of 07/18/22  Resp panel by RT-PCR (RSV, Flu A&B, Covid) Anterior Nasal Swab     Status: None   Collection Time: 07/18/22  3:51 PM   Specimen: Anterior Nasal Swab  Result Value Ref Range Status   SARS Coronavirus 2 by RT PCR NEGATIVE NEGATIVE Final   Influenza A by PCR NEGATIVE NEGATIVE Final   Influenza B by PCR NEGATIVE NEGATIVE Final    Comment: (NOTE) The Xpert Xpress SARS-CoV-2/FLU/RSV plus assay is intended as an aid in the diagnosis of influenza from Nasopharyngeal swab specimens and should not be used as a sole basis for treatment. Nasal washings and aspirates are unacceptable for Xpert Xpress SARS-CoV-2/FLU/RSV testing.  Fact Sheet for Patients: BloggerCourse.com  Fact Sheet for Healthcare Providers: SeriousBroker.it  This test is not yet approved or cleared by the [place]  FDA and has been authorized for detection and/or diagnosis of SARS-CoV-2 by FDA under an Emergency Use Authorization (EUA). This EUA will remain in effect (meaning this test can be used) for the duration of the COVID-19 declaration  under Section 564(b)(1) of the Act, 21 U.S.C. section 360bbb-3(b)(1), unless the authorization is terminated or revoked.     Resp Syncytial Virus by PCR NEGATIVE NEGATIVE Final    Comment: (NOTE) Fact Sheet for Patients: BloggerCourse.com  Fact Sheet for Healthcare Providers: SeriousBroker.it  This test is not yet approved or cleared by the [place]  FDA and has been authorized for detection and/or diagnosis of SARS-CoV-2 by FDA under an Emergency Use Authorization (EUA). This EUA will remain in effect (meaning this test can be used) for the duration of the COVID-19 declaration under Section 564(b)(1) of the Act, 21 U.S.C. section 360bbb-3(b)(1), unless the authorization is terminated or revoked.  Performed at [facility], [address], [place], [place] [address]     Labs: CBC: Recent Labs  Lab 03/09/24 0321 03/10/24 0308 03/11/24 1225  WBC 6.6 5.5 5.6  HGB 12.4 13.2 14.6  HCT 38.0 39.2 43.2  MCV 96.9 94.0 92.7  PLT 144* 199 173   Basic Metabolic Panel: Recent Labs  Lab 03/09/24 0321 03/10/24 0308 03/11/24 1225 03/12/24 0525  NA 136 138 137 133*  K 5.0 4.0 4.0 3.7  CL 101 104 101 99  CO2 27 25 21* 21*  GLUCOSE 107* 103* 94 81  BUN 7* 8 5* 6*  CREATININE 1.11* 1.01* 0.79 0.98  CALCIUM 8.6* 8.7* 9.2 8.8*  MG 2.3 2.1  --   --    Liver Function Tests: Recent Labs  Lab 03/11/24 1225 03/12/24 0525  AST 61* 40  ALT 35 31  ALKPHOS 71 64  BILITOT 0.6 0.4  PROT 6.9 6.3*  ALBUMIN 3.7  3.3*   CBG: No results for input(s): GLUCAP in the last 168 hours.  Discharge time spent: greater than 30 minutes.  Signed: [name], MD [facility] 03/15/2024

## 2024-03-15 NOTE — Progress Notes (Signed)
 Pt discharged to the discharge lounge. Discharge lounge will also give pt cab voucher. There were no home meds in pharmacy for pt per charge nurse

## 2024-03-15 NOTE — TOC Progression Note (Signed)
 Transition of Care (TOC) - Progression Note   Burnard with [facility] aware discharge is today  Patient Details  Name: Pamela Graham MRN: [id number] Date of Birth: [birth year]  Transition of Care [facility]) CM/SW Contact  Maximino Cozzolino, Powell Jansky, RN Phone Number: 03/15/2024, 4:14 PM  Clinical Narrative:       Expected Discharge Plan: Home w Home Health Services Barriers to Discharge: Continued Medical Work up               Expected Discharge Plan and Services   Discharge Planning Services: CM Consult Post Acute Care Choice: Home Health, Durable Medical Equipment Living arrangements for the past 2 months: Single Family Home Expected Discharge Date: 03/15/24               DME Arranged: Vannie rolling DME Agency: [employer] Date DME Agency Contacted: 03/05/24 Time DME Agency Contacted: 1341 Representative spoke with at DME Agency: [place] HH Arranged: PT HH Agency: [facility] Date [facility] Agency Contacted: 03/05/24 Time HH Agency Contacted: 1342 Representative spoke with at [facility] Agency: [place]   Social Drivers of Health (SDOH) Interventions SDOH Screenings   Food Insecurity: Patient Declined (03/05/2024)  Housing: Low Risk  (03/05/2024)  Transportation Needs: Patient Declined (03/05/2024)  Utilities: Patient Declined (03/05/2024)  Alcohol Screen: Low Risk  (11/20/2018)  Financial Resource Strain: Low Risk  (11/24/2023)   Received from [facility]  Physical Activity: Insufficiently Active (09/06/2022)   Received from [facility]  Social Connections: Unknown (03/05/2024)  Stress: Stress Concern Present (09/06/2022)   Received from [facility]  Tobacco Use: Medium Risk (03/14/2024)    Readmission Risk Interventions     No data to display

## 2024-03-15 NOTE — Plan of Care (Signed)
  Problem: Clinical Measurements: Goal: Diagnostic test results will improve Outcome: Progressing   Problem: Clinical Measurements: Goal: Cardiovascular complication will be avoided Outcome: Progressing   Problem: Pain Managment: Goal: General experience of comfort will improve and/or be controlled Outcome: Progressing

## 2024-03-16 ENCOUNTER — Ambulatory Visit: Payer: Self-pay | Admitting: Internal Medicine

## 2024-03-16 LAB — SURGICAL PATHOLOGY

## 2024-03-17 ENCOUNTER — Other Ambulatory Visit (HOSPITAL_COMMUNITY): Payer: Self-pay
